# Patient Record
Sex: Female | Born: 1955 | Race: White | Hispanic: No | Smoking: Former smoker
Health system: Southern US, Community
[De-identification: ages and names within clinical notes are randomized; demographics above are authoritative.]

## PROBLEM LIST (undated history)

## (undated) DIAGNOSIS — F32A Depression, unspecified: Secondary | ICD-10-CM

## (undated) DIAGNOSIS — E079 Disorder of thyroid, unspecified: Secondary | ICD-10-CM

## (undated) DIAGNOSIS — F410 Panic disorder [episodic paroxysmal anxiety] without agoraphobia: Secondary | ICD-10-CM

## (undated) DIAGNOSIS — E039 Hypothyroidism, unspecified: Secondary | ICD-10-CM

## (undated) DIAGNOSIS — J45909 Unspecified asthma, uncomplicated: Secondary | ICD-10-CM

## (undated) DIAGNOSIS — K219 Gastro-esophageal reflux disease without esophagitis: Secondary | ICD-10-CM

## (undated) DIAGNOSIS — F329 Major depressive disorder, single episode, unspecified: Secondary | ICD-10-CM

## (undated) HISTORY — DX: Panic disorder (episodic paroxysmal anxiety): F41.0

## (undated) HISTORY — DX: Gastro-esophageal reflux disease without esophagitis: K21.9

## (undated) HISTORY — DX: Unspecified asthma, uncomplicated: J45.909

## (undated) HISTORY — PX: ABDOMINAL HYSTERECTOMY: SHX81

## (undated) HISTORY — DX: Major depressive disorder, single episode, unspecified: F32.9

## (undated) HISTORY — DX: Depression, unspecified: F32.A

## (undated) HISTORY — DX: Disorder of thyroid, unspecified: E07.9

---

## 2002-05-15 ENCOUNTER — Ambulatory Visit (HOSPITAL_COMMUNITY): Admission: RE | Admit: 2002-05-15 | Discharge: 2002-05-15 | Payer: Self-pay | Admitting: Obstetrics and Gynecology

## 2002-05-15 ENCOUNTER — Encounter: Payer: Self-pay | Admitting: Obstetrics and Gynecology

## 2002-05-20 ENCOUNTER — Encounter: Payer: Self-pay | Admitting: Obstetrics and Gynecology

## 2002-05-20 ENCOUNTER — Ambulatory Visit (HOSPITAL_COMMUNITY): Admission: RE | Admit: 2002-05-20 | Discharge: 2002-05-20 | Payer: Self-pay | Admitting: Obstetrics and Gynecology

## 2002-05-21 ENCOUNTER — Encounter: Admission: RE | Admit: 2002-05-21 | Discharge: 2002-05-21 | Payer: Self-pay | Admitting: *Deleted

## 2002-05-26 ENCOUNTER — Encounter: Payer: Self-pay | Admitting: Obstetrics and Gynecology

## 2002-05-28 ENCOUNTER — Encounter (INDEPENDENT_AMBULATORY_CARE_PROVIDER_SITE_OTHER): Payer: Self-pay | Admitting: Specialist

## 2002-05-28 ENCOUNTER — Inpatient Hospital Stay (HOSPITAL_COMMUNITY): Admission: RE | Admit: 2002-05-28 | Discharge: 2002-05-30 | Payer: Self-pay | Admitting: Obstetrics and Gynecology

## 2010-07-22 NOTE — Discharge Summary (Signed)
NAME:  Erin Hale, Erin Hale                          ACCOUNT NO.:  [id number]   MEDICAL RECORD NO.:  [id number]                   PATIENT TYPE:  INP   LOCATION:  0443                                 FACILITY:  [facility]   PHYSICIAN:  Malachi Pro. Ambrose Mantle, M.D.              DATE OF BIRTH:  09/09/1955   DATE OF ADMISSION:  05/28/2002  DATE OF DISCHARGE:  05/30/2002                                 DISCHARGE SUMMARY   HISTORY OF PRESENT ILLNESS:  This is a 55 year old white female with a large  abdominopelvic mass, admitted for removal of the mass which was most likely  ovarian in origin, and removal of the uterus, tubes, and ovaries.   HOSPITAL COURSE:  Dr. Zachery Dakins assisted so that he could perform lymph node  biopsies and omentectomy if it was ovarian cancer.  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  The mass measured 622 g, and was described as a nodular rubbery  mass with a smooth exterior.  Subsequent to the removal of the mass which  was attached as described in the operative note, an abdominal hysterectomy  and bilateral salpingo-oophorectomy were done.  Postoperatively, the patient  did well.  She tolerated a liquid diet, remained afebrile, had good output,  voided well without difficulty, ambulated well, and was ready for discharge  on the second postoperative day.   LABORATORY DATA:  Initial hemoglobin was 13.9, hematocrit 41.7, white count  79, platelet count 282,000.  Followup hematocrits were 38.4 and 34.6.  Differential was normal.  Comprehensive metabolic profile was normal except  for a potassium of 3.3.  CA-125 was 22.2.  Urinalysis was negative.  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  Cervix showed squamous metaplasia  and focal hyperkeratosis, no dysplasia, secretory  endometrium, no  hyperplasia or malignancy, leiomyomata, intramural, right and left ovaries  benign follicular cysts, no endometriosis or malignancy, benign right and  left fallopian tubes.   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   OPERATION:  Removal of abdominopelvic mass, abdominal hysterectomy,  bilateral salpingo-oophorectomy.   CONDITION ON DISCHARGE:  Improved.   DIET:  Liquid diet until passing flatus or having a bowel movement.   ACTIVITY:  No heavy lifting or strenuous activity.   DISCHARGE INSTRUCTIONS:  Call with fever above 100.4 degrees.  Call with any  heavy vaginal bleeding.    FOLLOWUP:  Return to the office one week from surgery to remove her staples.   DISCHARGE MEDICATIONS:  Percocet 5/325 mg 24 tablets one or two q.4-6h.  p.r.n. pain is given at discharge.  Malachi Pro. Ambrose Mantle, M.D.    TFH/MEDQ  D:  05/30/2002  T:  05/30/2002  Job:  [id number]   cc:   Anselm Pancoast. Zachery Dakins, M.D.  [address]  [place]  [place] [address]  Fax: [phone]   Magnus Sinning. Dimple Casey, M.D.  [address] [place]  [place] [address]  Fax: [phone]

## 2010-07-22 NOTE — Op Note (Signed)
NAME:  Erin Hale, Erin Hale                          ACCOUNT NO.:  [id number]   MEDICAL RECORD NO.:  [id number]                   PATIENT TYPE:  INP   LOCATION:  0002                                 FACILITY:  [facility]   PHYSICIAN:  Malachi Pro. Ambrose Mantle, M.D.              DATE OF BIRTH:  20-Aug-1955   DATE OF PROCEDURE:  05/28/2002  DATE OF DISCHARGE:                                 OPERATIVE REPORT   PREOPERATIVE DIAGNOSIS:  Large abdominopelvic mass.   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   OPERATION:  1. Removal of abdominopelvic mass.  2. TAH/BSO.   OPERATOR:  Malachi Pro. Ambrose Mantle, M.D.   ASSISTANT:  Anselm Pancoast. Zachery Dakins, M.D.   ANESTHESIA:  General.   DESCRIPTION OF PROCEDURE:  The patient was brought to the operating room and  placed under satisfactory general anesthesia.  She was placed in frogleg  position.  The abdomen was prepped with Betadine solution.  The vagina and  urethra were prepped, and a Foley catheter was inserted to straight drain.  The patient was then placed supine.  The abdomen was draped as a sterile  field, and a midline incision from the pubis to the umbilicus was made and  carried in layers through the skin, subcutaneous tissue, and fascia.  A  midline was developed, and the peritoneum was entered.  There was some  ascitic fluid.  This was aspirated, and also washings were taken in case the  tumor was malignant.  I explored the upper abdomen.  The gallbladder felt  smooth.  The liver was smooth.  Both kidneys felt normal.  I did not feel  any upper abdominal abnormalities.  The mass presented right under the  incision, and it was a multilobulated mass that arose from the left side of  the fundus of the uterus and was thought to represent the left ovary.  It  was very convoluted in shape and extended up to the umbilicus but also  involved the posterior pelvic cul-de-sac.  It was densely adherent to the  right pelvic wall close to the  ureter, was adherent to the right ovary, and  seemed to have an attachment through a very large pedicle to the left fundus  of the uterus.  It was initially thought to represent the infundibulopelvic  ligament.  I initially divided the attachment to the uterus between clamps,  then divided to its attachment to the appendiceal epiploica of the sigmoid  colon, next divided its attachments to the right pelvic wall and to the  right ovary and at this point, the mass became free and was elevated and  removed from the operative field.  I then sent the tissue for frozen section  and as I began to explore the pelvis more thoroughly now that I could see  because of the absence of the mass, it was apparent that the patient still  had both  tubes and ovaries.  Using packs and retractors, I elevated the  uterus into the abdominal incision, divided both round ligaments, divided  both infundibulopelvic ligaments between clamps and doubly suture ligated  them.  I skeletonized the uterine vessels, pushed the bladder inferiorly,  clamped, cut, and suture ligated the uterine vessels, parametrial and  paracervical tissues, and then clamped, cut, and suture ligated and held the  uterosacral ligaments.  I entered the right angle of the vagina and removed  the uterus, tubes, and ovaries by transecting the upper vagina, placed  bilateral vaginal angle sutures, and closed the central portion of the  vagina with interrupted figure-of-eight sutures of 0 Vicryl.  Since the  patient's cervix presented at the introitus, I tried to give the vaginal  cuff as much support as possible by suturing the uterosacral ligaments  together in the midline with three sutures of 0 Vicryl.  After I obtained  hemostasis, I then used the round ligaments to try to give some support to  the vaginal angles.  Hemostasis was adequate.  I reperitonealized over the  vaginal cuff, liberally irrigated the pelvis and found hemostasis to be   complete.  There was one bleeder close to the right ureter where the  attachment had been divided.  We had identified the right ureter earlier.  We again identified it and then placed a ligature around the bleeder.  I  palpated the left ureter.  It was normal.  All packs and retractors were  removed.  Dr. Laureen Ochs called back and said that the tumor was a benign, smooth  muscle tumor, so it probably had been a pedunculated fibroid that over time  had continued to grow and became attached to other pelvic structures.  Neither tube or ovary appeared abnormal, and the uterus also appeared  normal.  After the packs and retractors were removed, the peritoneum was  closed with a running suture of 0 Vicryl, the fascia with multiple  interrupted figure-of-eight sutures of #1 Novofil, subcu tissue with a  running 3-0 Vicryl, and the skin was closed with automatic staples.  The  patient seemed to tolerate the procedure well.  Blood loss was about 300 mL.  Sponge and needle counts were correct.  The washings were discarded.  The  patient was returned to recovery in satisfactory condition.                                               Malachi Pro. Ambrose Mantle, M.D.    TFH/MEDQ  D:  05/28/2002  T:  05/28/2002  Job:  [id number]   cc:   Anselm Pancoast. Zachery Dakins, M.D.  [address]  [place]  [place] [address]  Fax: [phone]   Magnus Sinning. Dimple Casey, M.D.  [address] [place]  [place] [address]  Fax: [phone]

## 2010-07-22 NOTE — H&P (Signed)
NAMESKYLINN, VIALPANDO                            ACCOUNT NO.:  [id number]   MEDICAL RECORD NO.:  [id number]                   PATIENT TYPE:   LOCATION:                                       FACILITY:   PHYSICIAN:  Malachi Pro. Ambrose Mantle, M.D.              DATE OF BIRTH:   DATE OF ADMISSION:  05/28/2002  DATE OF DISCHARGE:                                HISTORY & PHYSICAL   HISTORY OF PRESENT ILLNESS:  The patient is a 55 year old white divorced  female, Para 1, 0/0/1 who was admitted to [facility] for  surgical exploration of a large abdominal pelvic mass. Last menstrual period  was May 05, 2002. The patient's periods have occurred irregularly over the  last eight months, having had only 3-4 periods in the last eight months. She  was complaining of pelvic pressure and saw Dr. Dimple Casey for evaluation. She  reported her last PAP smear was ten years ago. She had had no sexual  activity in eight years but for the past two months, she had had increased  pelvic pressure on her vagina and her rectum. She had felt a protrusion from  her vagina for two years. Her pant size had decreased. She had lost  approximately 50 pounds which she attributed to nervousness secondary to a  divorce, bankruptcy, death of a dog and death of her mother. Dr. Dimple Casey  examined her and saw the protrusion in her vagina and asked me to see her.  When I examined Ms. Jeangilles, the cervix protruded to the introitus. The  uterus felt relaxed but was not easily palpable. It was thought to be  anterior but there was suggestion of a posterior pelvic mass that also  involved the lower abdomen. She was sent for an ultrasound that showed a  slightly enlarged uterus containing at least one fibroid and a large pelvic  mass that was solid in nature and filling the cul-de-sac. A CT scan was done  which revealed normal lung bases. Unremarkable liver, gallbladder, spleen,  adrenal glands, pancreas, and kidney's. There was no  hydronephrosis, no  ascites, and no adenopathy. In the pelvis, there was a mixed attenuation  mass surrounding the fundus of the uterus and extending superior to the  uterus and bladder. It's margins were well demarcated. It measured 16.5 cm  in maximum transverse diameter. There were internal areas of enhancement and  higher attenuation. The ovaries were not discretely identified separate from  the mass. There were normal size left inguinal and left external iliac lymph  nodes, a normal appendix, and the impression was a 16.5 cm pelvic mass  encasing the uterus, suspect ovarian in origin, although the ovaries were  not discretely identified. The patient was advised exploratory laparotomy  and she is admitted for that at this time.   ALLERGIES:  No known drug allergies.   PAST SURGICAL HISTORY:  None.  PAST MEDICAL HISTORY:  No significant major adult illnesses. No heart  problems. She does have irritable bowel syndrome.   SOCIAL HISTORY:  No alcohol or tobacco. The patient is divorced. She works  27 hours per week at [employer]. She completed the 10th grade at [employer].   MEDICATIONS:  She takes Lexapro 10 mg one tab each day, Levoxyl 25 mcg one  tab twice a day, Trazodone Hydrochloride 50 mg one tab at bedtimes,  Clonazepam 0.5 mg 1/2 tab in the morning.   FAMILY HISTORY:  Mother died at 49 years of age of heart problems. Father  died at 71 years of age from brain cancer. Two sisters and five brothers are  living and well. One brother died from a self inflicted wound.   PHYSICAL EXAMINATION:  GENERAL: A well developed, obese, white female in no  acute distress.  VITAL SIGNS: Weight 245 pounds. Height 5'4. Blood pressure 100/74, pulse  78.  HEENT: No cranial abnormalities. Extraocular muscles intact. Nose and  pharynx clear.  NECK: Supple without thyromegaly.  BREASTS: Soft, no masses sitting up or lying down.  HEART: Normal size and sounds. No murmurs.  LUNGS:  Clear to auscultation and percussion.  ABDOMEN: Soft. There is a suggestion of a lower abdominal mass that rises  part way to the umbilicus.  GU: Vulva and vagina clean. PAP smear returned as atypical squamous cells of  undetermined significance. Colposcopy was done on May 27, 2002 and the  squamocolumnar junction was seen. There was minimally whitened epithelium on  the endocervix but nothing suggestive of major intraepithelial neoplasia.  The cervix protrudes to the introitus. The uterus is difficult to feel but  is thought to be anterior and small and very mobile. There is a large mass  posterior to the uterus that is irregular.  RECTAL: Examination is negative.   ADMISSION IMPRESSION:  Large abdominopelvic mass, pelvic relaxation, with  prolapse of the cervix.   PLAN:  The patient is admitted for exploratory laparotomy, removal of the  pelvic mass, probable abdominal hysterectomy, and bilateral salpingo-  oophorectomy with possible lymph node dissection and omentectomy. The  patient has been counseled about the risks of surgery including but not  limited to heart attack, stroke, pulmonary embolus, wound disruption,  hemorrhage with need for re-operation, and/or transfusion, intestinal  obstruction, nerve injury, and infection. She understands that if the tumor  is malignant, she will require additional surgery and she is ready to  proceed.                                               Malachi Pro. Ambrose Mantle, M.D.    TFH/MEDQ  D:  05/27/2002  T:  05/27/2002  Job:  [id number]   cc:   Anselm Pancoast. Zachery Dakins, M.D.  [address]  [place]  [place] [address]  Fax: [phone]   Magnus Sinning. Dimple Casey, M.D.  [address] [place]  [place] [address]  Fax: [phone]

## 2012-06-12 ENCOUNTER — Telehealth: Payer: Self-pay | Admitting: Family Medicine

## 2012-06-12 NOTE — Telephone Encounter (Signed)
Needs to be seen to change anti-anxiety/antidepressant medication.  Was out of work today because of it and wants to be seen tomorrow.  Currently there are no available appts.  I will call her back in the morning to schedule.

## 2012-06-13 ENCOUNTER — Other Ambulatory Visit: Payer: Self-pay | Admitting: *Deleted

## 2012-06-13 ENCOUNTER — Ambulatory Visit (INDEPENDENT_AMBULATORY_CARE_PROVIDER_SITE_OTHER): Payer: PRIVATE HEALTH INSURANCE | Admitting: Family Medicine

## 2012-06-13 ENCOUNTER — Ambulatory Visit: Payer: Self-pay | Admitting: General Practice

## 2012-06-13 ENCOUNTER — Encounter: Payer: Self-pay | Admitting: Family Medicine

## 2012-06-13 VITALS — BP 118/74 | HR 86 | Temp 98.6°F | Ht 64.0 in | Wt 248.0 lb

## 2012-06-13 DIAGNOSIS — R300 Dysuria: Secondary | ICD-10-CM

## 2012-06-13 DIAGNOSIS — N390 Urinary tract infection, site not specified: Secondary | ICD-10-CM

## 2012-06-13 DIAGNOSIS — R309 Painful micturition, unspecified: Secondary | ICD-10-CM

## 2012-06-13 DIAGNOSIS — R350 Frequency of micturition: Secondary | ICD-10-CM

## 2012-06-13 LAB — POCT URINALYSIS DIPSTICK
Bilirubin, UA: NEGATIVE
Glucose, UA: NEGATIVE
Nitrite, UA: NEGATIVE
Protein, UA: NEGATIVE
Spec Grav, UA: 1.025
Urobilinogen, UA: NEGATIVE
pH, UA: 6

## 2012-06-13 LAB — POCT UA - MICROSCOPIC ONLY
Bacteria, U Microscopic: NEGATIVE
Casts, Ur, LPF, POC: NEGATIVE
Crystals, Ur, HPF, POC: NEGATIVE
Mucus, UA: NEGATIVE

## 2012-06-13 MED ORDER — ALPRAZOLAM 0.25 MG PO TABS: ORAL_TABLET | ORAL | Status: DC

## 2012-06-13 MED ORDER — CIPROFLOXACIN HCL 500 MG PO TABS: 500.0000 mg | ORAL_TABLET | Freq: Two times a day (BID) | ORAL | Status: DC

## 2012-06-13 NOTE — Telephone Encounter (Signed)
Worsening of Depression.  Taking Venaflexin and would like to change back to Cymbalta.  Saw better results with that.  Has also taken Celexa in the past and didn't like it.  Also c/o dysuria and frequency for a few days.  Has taken an OTC medication and symptoms have improved but she would like to have a urinalysis.    Appt scheduled in after hours clinic to check for UTI.  Will most likely have to address depression issues at a subsequent appt.

## 2012-06-13 NOTE — Telephone Encounter (Signed)
ROUTE TO YOUR NURSE TO PHONE IN TO  [facility]  . LAST OV 9/13. LAST REFILL 05/14/12

## 2012-06-13 NOTE — Progress Notes (Signed)
  Subjective:    Patient ID: Erin Hale, female    DOB: Jul 25, 1955, 57 y.o.   MRN: [id number]  HPI Frequency burning and pain off and on for a couple of months. Some suprapubic pressure also. The most recent episode single and on for 4 days. It is only relieved by Urecholine.   Review of Systems  Gastrointestinal: Positive for abdominal pain (lower, pressure achy).  Genitourinary: Positive for urgency (off and on x 6 mths).       Objective:   Physical Exam  Nursing note and vitals reviewed. Constitutional: She is oriented to person, place, and time. She appears well-developed and well-nourished.  Abdominal: Soft. She exhibits no distension and no mass. There is no tenderness. There is no rebound and no guarding.  Neurological: She is alert and oriented to person, place, and time.  Skin: Skin is warm and dry.  Psychiatric: She has a normal mood and affect. Her behavior is normal. Judgment and thought content normal.     Results for orders placed in visit on 06/13/12  POCT UA - MICROSCOPIC ONLY      Result Value Range   WBC, Ur, HPF, POC 5-10     RBC, urine, microscopic 1-5     Bacteria, U Microscopic neg     Mucus, UA neg     Epithelial cells, urine per micros occ     Crystals, Ur, HPF, POC neg     Casts, Ur, LPF, POC neg     Yeast, UA occ    POCT URINALYSIS DIPSTICK      Result Value Range   Color, UA amber     Clarity, UA cloudy     Glucose, UA neg     Bilirubin, UA neg     Ketones, UA neg     Spec Grav, UA 1.025     Blood, UA trace     pH, UA 6.0     Protein, UA neg     Urobilinogen, UA negative     Nitrite, UA neg     Leukocytes, UA Trace         Assessment & Plan:  1. Urinary frequency - POCT UA - Microscopic Only - POCT urinalysis dipstick - Urine culture  2. Painful urination - POCT UA - Microscopic Only - POCT urinalysis dipstick - Urine culture

## 2012-06-13 NOTE — Patient Instructions (Addendum)
Drink plenty of fluids. Meds as directed Recheck urinalysis in 10-14 days Pt will taper off of Venlafaxine 75mg  taking one qod x 4 doses, then take one every third day for 2 doses, then will start Cymbalta 30mg  qd for 30days. Will return to clinic for recheck of meds in 5-6 weeks

## 2012-06-13 NOTE — Telephone Encounter (Signed)
Please phone in Xanax RX with 0 refills

## 2012-06-14 NOTE — Telephone Encounter (Signed)
Rx called to [facility] vm.  Sister will tell pt rx has been done.

## 2012-06-15 LAB — URINE CULTURE: Colony Count: 3000

## 2012-06-24 ENCOUNTER — Other Ambulatory Visit (INDEPENDENT_AMBULATORY_CARE_PROVIDER_SITE_OTHER): Payer: PRIVATE HEALTH INSURANCE

## 2012-06-24 DIAGNOSIS — N390 Urinary tract infection, site not specified: Secondary | ICD-10-CM

## 2012-06-24 LAB — POCT URINALYSIS DIPSTICK
Blood, UA: NEGATIVE
Glucose, UA: NEGATIVE
Ketones, UA: NEGATIVE
Spec Grav, UA: 1.02
Urobilinogen, UA: NEGATIVE

## 2012-06-24 LAB — POCT UA - MICROSCOPIC ONLY

## 2012-06-24 NOTE — Progress Notes (Unsigned)
Patient came in for re-check on urine  

## 2012-07-12 ENCOUNTER — Other Ambulatory Visit: Payer: Self-pay

## 2012-07-12 MED ORDER — ALPRAZOLAM 0.25 MG PO TABS: ORAL_TABLET | ORAL | Status: DC

## 2012-07-12 NOTE — Telephone Encounter (Signed)
OK to phone xanax rx

## 2012-07-12 NOTE — Telephone Encounter (Signed)
Last filled 06/13/12   If approved phone in and have nurse notify patient

## 2012-07-13 NOTE — Telephone Encounter (Signed)
Called in to [facility] 

## 2012-07-15 ENCOUNTER — Telehealth: Payer: Self-pay | Admitting: Nurse Practitioner

## 2012-07-16 NOTE — Telephone Encounter (Signed)
Patient aware we have no samples. 

## 2012-07-16 NOTE — Telephone Encounter (Signed)
Ok for cymbalta samples

## 2012-07-16 NOTE — Telephone Encounter (Signed)
Please advise 

## 2012-07-23 ENCOUNTER — Other Ambulatory Visit: Payer: Self-pay | Admitting: Family Medicine

## 2012-07-25 ENCOUNTER — Other Ambulatory Visit: Payer: Self-pay | Admitting: Family Medicine

## 2012-08-05 ENCOUNTER — Ambulatory Visit: Payer: PRIVATE HEALTH INSURANCE | Admitting: Nurse Practitioner

## 2012-08-05 ENCOUNTER — Encounter: Payer: Self-pay | Admitting: Nurse Practitioner

## 2012-08-05 ENCOUNTER — Other Ambulatory Visit: Payer: PRIVATE HEALTH INSURANCE

## 2012-08-05 ENCOUNTER — Ambulatory Visit (INDEPENDENT_AMBULATORY_CARE_PROVIDER_SITE_OTHER): Payer: PRIVATE HEALTH INSURANCE | Admitting: Nurse Practitioner

## 2012-08-05 VITALS — BP 130/74 | HR 71 | Temp 99.1°F | Ht 64.0 in | Wt 256.0 lb

## 2012-08-05 DIAGNOSIS — E039 Hypothyroidism, unspecified: Secondary | ICD-10-CM

## 2012-08-05 DIAGNOSIS — M5430 Sciatica, unspecified side: Secondary | ICD-10-CM

## 2012-08-05 DIAGNOSIS — F329 Major depressive disorder, single episode, unspecified: Secondary | ICD-10-CM | POA: Insufficient documentation

## 2012-08-05 DIAGNOSIS — K219 Gastro-esophageal reflux disease without esophagitis: Secondary | ICD-10-CM

## 2012-08-05 DIAGNOSIS — M5432 Sciatica, left side: Secondary | ICD-10-CM

## 2012-08-05 LAB — COMPLETE METABOLIC PANEL WITH GFR
Albumin: 4 g/dL (ref 3.5–5.2)
Alkaline Phosphatase: 81 U/L (ref 39–117)
Chloride: 106 mEq/L (ref 96–112)
GFR, Est Non African American: 88 mL/min
Glucose, Bld: 105 mg/dL — ABNORMAL HIGH (ref 70–99)
Potassium: 4.6 mEq/L (ref 3.5–5.3)
Sodium: 139 mEq/L (ref 135–145)
Total Protein: 6.7 g/dL (ref 6.0–8.3)

## 2012-08-05 LAB — THYROID PANEL WITH TSH
Free Thyroxine Index: 3.6 (ref 1.0–3.9)
T3 Uptake: 32.6 % (ref 22.5–37.0)

## 2012-08-05 MED ORDER — DULOXETINE HCL 30 MG PO CPEP: 30.0000 mg | ORAL_CAPSULE | Freq: Every day | ORAL | Status: DC

## 2012-08-05 MED ORDER — PREDNISONE 20 MG PO TABS: ORAL_TABLET | ORAL | Status: DC

## 2012-08-05 MED ORDER — ALPRAZOLAM 0.25 MG PO TABS: ORAL_TABLET | ORAL | Status: DC

## 2012-08-05 NOTE — Progress Notes (Signed)
  Subjective:    Patient ID: Erin Hale, female    DOB: 01-Nov-1955, 57 y.o.   MRN: [id number]  Thyroid Problem Presents for follow-up visit. Symptoms include fatigue. Patient reports no anxiety, depressed mood, diarrhea, dry skin, hair loss, heat intolerance, leg swelling, nail problem, palpitations, visual change, weight gain or weight loss. The symptoms have been stable.  GERD Zantac- working well- occasional symptoms if she eats certain things Depression Patient currently on cymbalta and works well - no c/o side effects Left buttocks pain that radiates down leg Patient talking advil OTC which helps slightly- Started over a month ago- ddescribes pain as a burning type pain. GAD Xanax BID- helps some still gets anxious at times.   Review of Systems  Constitutional: Positive for fatigue. Negative for weight loss and weight gain.  Cardiovascular: Negative for palpitations.  Gastrointestinal: Negative for diarrhea.  Endocrine: Negative for heat intolerance.  Musculoskeletal: Positive for myalgias (left leg).  All other systems reviewed and are negative.       Objective:   Physical Exam  Constitutional: She is oriented to person, place, and time. She appears well-developed and well-nourished.  HENT:  Nose: Nose normal.  Mouth/Throat: Oropharynx is clear and moist.  Eyes: EOM are normal.  Neck: Trachea normal, normal range of motion and full passive range of motion without pain. Neck supple. No JVD present. Carotid bruit is not present. No thyromegaly present.  Cardiovascular: Normal rate, regular rhythm, normal heart sounds and intact distal pulses.  Exam reveals no gallop and no friction rub.   No murmur heard. Pulmonary/Chest: Effort normal and breath sounds normal.  Abdominal: Soft. Bowel sounds are normal. She exhibits no distension and no mass. There is no tenderness.  Musculoskeletal: Normal range of motion.  FROM of left hip without pain.  Lymphadenopathy:    She has no  cervical adenopathy.  Neurological: She is alert and oriented to person, place, and time. She has normal reflexes.  Skin: Skin is warm and dry.  Psychiatric: She has a normal mood and affect. Her behavior is normal. Judgment and thought content normal.     BP 130/74  Pulse 71  Temp(Src) 99.1 F (37.3 C) (Oral)  Ht 5\' 4"  (1.626 m)  Wt 256 lb (116.121 kg)  BMI 43.92 kg/m2       Assessment & Plan:  1. Hypothyroidism  - COMPLETE METABOLIC PANEL WITH GFR - NMR Lipoprofile with Lipids - Thyroid Panel With TSH  2. Depression Stress management - ALPRAZolam (XANAX) 0.25 MG tablet; ONE PO BID PRN  Dispense: 60 tablet; Refill: 0 - DULoxetine (CYMBALTA) 30 MG capsule; Take 1 capsule (30 mg total) by mouth daily.  Dispense: 30 capsule; Refill: 1  3. GERD (gastroesophageal reflux disease) Avoid spicy and fatty foods Continue zantac as rx  4. Sciatica of left side Hold motrin while on prednisone- Tylenol is fine - predniSONE (DELTASONE) 20 MG tablet; 2 PO at same time daily for 5 days  Dispense: 10 tablet; Refill: 0  Mary-Margaret Daphine Deutscher, FNP

## 2012-08-05 NOTE — Patient Instructions (Signed)

## 2012-08-06 LAB — NMR LIPOPROFILE WITH LIPIDS
Cholesterol, Total: 198 mg/dL
HDL Particle Number: 48.9 umol/L
HDL Size: 9.9 nm
HDL-C: 81 mg/dL
LDL (calc): 98 mg/dL
LDL Particle Number: 1008 nmol/L — ABNORMAL HIGH
LDL Size: 21.2 nm
LP-IR Score: 29
Large HDL-P: 13.9 umol/L
Large VLDL-P: 3 nmol/L — ABNORMAL HIGH
Small LDL Particle Number: 345 nmol/L
Triglycerides: 94 mg/dL
VLDL Size: 46.8 nm — ABNORMAL HIGH

## 2012-08-15 ENCOUNTER — Encounter: Payer: Self-pay | Admitting: *Deleted

## 2012-08-19 ENCOUNTER — Telehealth: Payer: Self-pay | Admitting: Nurse Practitioner

## 2012-08-20 MED ORDER — LEVOTHYROXINE SODIUM 75 MCG PO TABS: 75.0000 ug | ORAL_TABLET | Freq: Every day | ORAL | Status: DC

## 2012-08-20 NOTE — Telephone Encounter (Signed)
Done 08/19/12 

## 2012-09-13 ENCOUNTER — Other Ambulatory Visit: Payer: Self-pay

## 2012-09-13 DIAGNOSIS — F329 Major depressive disorder, single episode, unspecified: Secondary | ICD-10-CM

## 2012-09-13 MED ORDER — ALPRAZOLAM 0.25 MG PO TABS: ORAL_TABLET | ORAL | Status: DC

## 2012-09-13 NOTE — Telephone Encounter (Signed)
Last seen and filled 08/05/12   MMM  If approved call in and have nurse notify patient

## 2012-09-13 NOTE — Telephone Encounter (Signed)
Please call in xanax with 1 refill 

## 2012-09-14 NOTE — Telephone Encounter (Signed)
Pt aware and rx called into pharmacy. 

## 2012-10-14 ENCOUNTER — Telehealth: Payer: Self-pay | Admitting: Nurse Practitioner

## 2012-10-14 NOTE — Telephone Encounter (Signed)
Mmm to address 

## 2012-10-14 NOTE — Telephone Encounter (Signed)
NEEDS TO BE SEEN TO DISCUSS

## 2012-10-15 NOTE — Telephone Encounter (Signed)
Patient aware and appt made for Monday at 10:45

## 2012-10-21 ENCOUNTER — Encounter: Payer: Self-pay | Admitting: Nurse Practitioner

## 2012-10-21 ENCOUNTER — Ambulatory Visit (INDEPENDENT_AMBULATORY_CARE_PROVIDER_SITE_OTHER): Payer: PRIVATE HEALTH INSURANCE | Admitting: Nurse Practitioner

## 2012-10-21 VITALS — BP 137/82 | HR 81 | Temp 97.9°F | Ht 64.0 in | Wt 261.0 lb

## 2012-10-21 DIAGNOSIS — F329 Major depressive disorder, single episode, unspecified: Secondary | ICD-10-CM

## 2012-10-21 MED ORDER — ESCITALOPRAM OXALATE 20 MG PO TABS: 20.0000 mg | ORAL_TABLET | Freq: Every day | ORAL | Status: DC

## 2012-10-21 NOTE — Progress Notes (Signed)
  Subjective:    Patient ID: Erin Hale, female    DOB: 13-Feb-1956, 57 y.o.   MRN: [id number]  HPI Patient in to discuss her depression- she has been on cymbalta for several months- says that it doesn't seem to be helping and it cost to much- Patient would like to try something else. Patient has been on a lot of meds in the past- Zoloft, Paxil, Venlafaxine, celexa and lexapro- lexapro helped for awhile.    Review of Systems  Psychiatric/Behavioral: Positive for sleep disturbance and dysphoric mood. Negative for suicidal ideas, behavioral problems, confusion, self-injury, decreased concentration and agitation. The patient is not nervous/anxious.        Objective:   Physical Exam  Constitutional: She appears well-developed and well-nourished.  Cardiovascular: Normal rate and normal heart sounds.   Pulmonary/Chest: Effort normal and breath sounds normal.  Psychiatric: Her speech is normal and behavior is normal. Judgment and thought content normal. Cognition and memory are normal. She exhibits a depressed mood.    BP 137/82  Pulse 81  Temp(Src) 97.9 F (36.6 C) (Oral)  Ht 5\' 4"  (1.626 m)  Wt 261 lb (118.389 kg)  BMI 44.78 kg/m2       Assessment & Plan:  1. Depression Stress management Exercise daily Find something to look forward to dialy Follow up in 3 months Meds ordered this encounter  Medications  . escitalopram (LEXAPRO) 20 MG tablet    Sig: Take 1 tablet (20 mg total) by mouth daily.    Dispense:  30 tablet    Refill:  5    Order Specific Question:  Supervising Provider    Answer:  Ernestina Penna [[id number]]   D/C cymbalta- wean off if can  Mary-Margaret Daphine Deutscher, FNP  -

## 2012-10-21 NOTE — Patient Instructions (Addendum)

## 2012-11-15 ENCOUNTER — Other Ambulatory Visit: Payer: Self-pay | Admitting: *Deleted

## 2012-11-15 DIAGNOSIS — F329 Major depressive disorder, single episode, unspecified: Secondary | ICD-10-CM

## 2012-11-15 MED ORDER — ALPRAZOLAM 0.25 MG PO TABS: ORAL_TABLET | ORAL | Status: DC

## 2012-11-15 NOTE — Telephone Encounter (Signed)
Patient last seen in office on 10-21-12. Rx last filled on 10-14-12. Please advise. If approved please route to Pool B so nurse can phone in to [facility]

## 2012-11-15 NOTE — Telephone Encounter (Signed)
Please call in ambien rx with 1 refill 

## 2012-11-19 NOTE — Telephone Encounter (Signed)
Called in.

## 2012-12-16 ENCOUNTER — Other Ambulatory Visit: Payer: Self-pay

## 2013-02-04 ENCOUNTER — Ambulatory Visit: Payer: PRIVATE HEALTH INSURANCE | Admitting: Nurse Practitioner

## 2013-02-10 ENCOUNTER — Other Ambulatory Visit: Payer: Self-pay

## 2013-02-10 DIAGNOSIS — F329 Major depressive disorder, single episode, unspecified: Secondary | ICD-10-CM

## 2013-02-10 NOTE — Telephone Encounter (Signed)
Last seen 10/21/12  MMM If approved route to nurse to call into [facility] 

## 2013-02-10 NOTE — Telephone Encounter (Signed)
Please call in xanax rx 

## 2013-02-11 NOTE — Telephone Encounter (Signed)
Refill given to pharmacist at [facility]

## 2013-03-13 ENCOUNTER — Other Ambulatory Visit: Payer: Self-pay

## 2013-03-13 DIAGNOSIS — F329 Major depressive disorder, single episode, unspecified: Secondary | ICD-10-CM

## 2013-03-13 DIAGNOSIS — F32A Depression, unspecified: Secondary | ICD-10-CM

## 2013-03-13 MED ORDER — ALPRAZOLAM 0.25 MG PO TABS: ORAL_TABLET | ORAL | Status: DC

## 2013-03-13 NOTE — Telephone Encounter (Signed)
Called in.

## 2013-03-13 NOTE — Telephone Encounter (Signed)
Please call in xanax rx 

## 2013-03-13 NOTE — Telephone Encounter (Signed)
Last seen 10/21/12  MMM If approved route to nurse to call into [facility]

## 2013-04-14 ENCOUNTER — Other Ambulatory Visit: Payer: Self-pay | Admitting: Nurse Practitioner

## 2013-04-15 NOTE — Telephone Encounter (Signed)
Last seen 10/21/12  MMM

## 2013-04-28 ENCOUNTER — Telehealth: Payer: Self-pay | Admitting: Nurse Practitioner

## 2013-04-28 NOTE — Telephone Encounter (Signed)
appt made. Refused appt today.

## 2013-04-29 ENCOUNTER — Ambulatory Visit: Payer: PRIVATE HEALTH INSURANCE | Admitting: Nurse Practitioner

## 2013-04-29 ENCOUNTER — Encounter: Payer: Self-pay | Admitting: Family Medicine

## 2013-04-29 ENCOUNTER — Ambulatory Visit (INDEPENDENT_AMBULATORY_CARE_PROVIDER_SITE_OTHER): Payer: PRIVATE HEALTH INSURANCE | Admitting: Family Medicine

## 2013-04-29 VITALS — BP 135/88 | HR 104 | Temp 99.0°F | Ht 64.0 in | Wt 259.8 lb

## 2013-04-29 DIAGNOSIS — J111 Influenza due to unidentified influenza virus with other respiratory manifestations: Secondary | ICD-10-CM

## 2013-04-29 DIAGNOSIS — J029 Acute pharyngitis, unspecified: Secondary | ICD-10-CM

## 2013-04-29 DIAGNOSIS — R52 Pain, unspecified: Secondary | ICD-10-CM

## 2013-04-29 DIAGNOSIS — R509 Fever, unspecified: Secondary | ICD-10-CM

## 2013-04-29 LAB — POCT INFLUENZA A/B
Influenza A, POC: NEGATIVE
Influenza B, POC: POSITIVE

## 2013-04-29 LAB — POCT RAPID STREP A (OFFICE): Rapid Strep A Screen: NEGATIVE

## 2013-04-29 MED ORDER — OSELTAMIVIR PHOSPHATE 75 MG PO CAPS: 75.0000 mg | ORAL_CAPSULE | Freq: Two times a day (BID) | ORAL | Status: DC

## 2013-04-29 MED ORDER — AZITHROMYCIN 250 MG PO TABS: ORAL_TABLET | ORAL | Status: DC

## 2013-04-29 NOTE — Progress Notes (Signed)
   Subjective:    Patient ID: Erin Hale, female    DOB: 1955/10/02, 58 y.o.   MRN: [id number]  HPI This 58 y.o. female presents for evaluation of uri, sore throat, and body aches.   Review of Systems No chest pain, SOB, HA, dizziness, vision change, N/V, diarrhea, constipation, dysuria, urinary urgency or frequency, myalgias, arthralgias or rash.     Objective:   Physical Exam Vital signs noted  Well developed well nourished female.  HEENT - Head atraumatic Normocephalic                Eyes - PERRLA, Conjuctiva - clear Sclera- Clear EOMI                Ears - EAC's Wnl TM's Wnl Gross Hearing WNL                Nose - Nares patent                 Throat - oropharanx wnl Respiratory - Lungs CTA bilateral Cardiac - RRR S1 and S2 without murmur GI - Abdomen soft Nontender and bowel sounds active x 4 Extremities - No edema. Neuro - Grossly intact.       Assessment & Plan:  Sore throat - Plan: POCT rapid strep A, POCT Influenza A/B, azithromycin (ZITHROMAX) 250 MG tablet, oseltamivir (TAMIFLU) 75 MG capsule  Fever - Plan: POCT rapid strep A, POCT Influenza A/B, azithromycin (ZITHROMAX) 250 MG tablet, oseltamivir (TAMIFLU) 75 MG capsule  Body aches - Plan: POCT rapid strep A, POCT Influenza A/B, azithromycin (ZITHROMAX) 250 MG tablet, oseltamivir (TAMIFLU) 75 MG capsule  Flu - Plan: azithromycin (ZITHROMAX) 250 MG tablet, oseltamivir (TAMIFLU) 75 MG capsule  Push po fluids, rest, tylenol and motrin otc prn as directed for fever, arthralgias, and myalgias.  Follow up prn if sx's continue or persist.  Lysbeth Penner FNP

## 2013-05-07 ENCOUNTER — Encounter: Payer: Self-pay | Admitting: *Deleted

## 2013-05-07 ENCOUNTER — Ambulatory Visit (INDEPENDENT_AMBULATORY_CARE_PROVIDER_SITE_OTHER): Payer: PRIVATE HEALTH INSURANCE | Admitting: Family Medicine

## 2013-05-07 ENCOUNTER — Other Ambulatory Visit: Payer: Self-pay

## 2013-05-07 VITALS — BP 117/75 | HR 73 | Temp 97.8°F | Ht 64.0 in | Wt 257.0 lb

## 2013-05-07 DIAGNOSIS — J069 Acute upper respiratory infection, unspecified: Secondary | ICD-10-CM

## 2013-05-07 DIAGNOSIS — F32A Depression, unspecified: Secondary | ICD-10-CM

## 2013-05-07 DIAGNOSIS — F329 Major depressive disorder, single episode, unspecified: Secondary | ICD-10-CM

## 2013-05-07 MED ORDER — METHYLPREDNISOLONE (PAK) 4 MG PO TABS: ORAL_TABLET | ORAL | Status: DC

## 2013-05-07 MED ORDER — AZITHROMYCIN 250 MG PO TABS: ORAL_TABLET | ORAL | Status: DC

## 2013-05-07 NOTE — Telephone Encounter (Signed)
Last seen 05/07/13  Bill if approved route to nurse to call into [facility]

## 2013-05-07 NOTE — Patient Instructions (Signed)

## 2013-05-07 NOTE — Progress Notes (Signed)
   Subjective:    Patient ID: Erin Hale, female    DOB: 06/23/1955, 58 y.o.   MRN: [id number]  HPI This 58 y.o. female presents for evaluation of cough and uri sx's for over a week. She was tx'd for flu a few weeks ago and is now having cough and wheezing. She is feeling washed out.   Review of Systems C/o cough and uri sx's. No chest pain, SOB, HA, dizziness, vision change, N/V, diarrhea, constipation, dysuria, urinary urgency or frequency, myalgias, arthralgias or rash.     Objective:   Physical Exam  Vital signs noted  Well developed well nourished female.  HEENT - Head atraumatic Normocephalic                Eyes - PERRLA, Conjuctiva - clear Sclera- Clear EOMI                Ears - EAC's Wnl TM's Wnl Gross Hearing WNL                Nose - Nares patent                 Throat - oropharanx wnl Respiratory - Lungs CTA bilateral Cardiac - RRR S1 and S2 without murmur GI - Abdomen soft Nontender and bowel sounds active x 4 Extremities - No edema. Neuro - Grossly intact.      Assessment & Plan:  URI (upper respiratory infection) - Plan: methylPREDNIsolone (MEDROL DOSPACK) 4 MG tablet, azithromycin (ZITHROMAX) 250 MG tablet Breo inhaler once a day #1 sample  Push po fluids, rest, tylenol and motrin otc prn as directed for fever, arthralgias, and myalgias.  Follow up prn if sx's continue or persist.  Lysbeth Penner FNP

## 2013-05-08 NOTE — Telephone Encounter (Signed)
Need to follow up for appointment

## 2013-05-09 ENCOUNTER — Other Ambulatory Visit: Payer: Self-pay | Admitting: *Deleted

## 2013-05-09 DIAGNOSIS — F329 Major depressive disorder, single episode, unspecified: Secondary | ICD-10-CM

## 2013-05-09 DIAGNOSIS — F32A Depression, unspecified: Secondary | ICD-10-CM

## 2013-05-09 MED ORDER — ALPRAZOLAM 0.25 MG PO TABS: ORAL_TABLET | ORAL | Status: DC

## 2013-05-09 NOTE — Telephone Encounter (Signed)
Called in.

## 2013-05-09 NOTE — Telephone Encounter (Signed)
Last seen you 10/21/12, last filled 04/13/13. Pt uses [facility]

## 2013-05-09 NOTE — Telephone Encounter (Signed)
Please call into pharmacy

## 2013-05-13 ENCOUNTER — Other Ambulatory Visit: Payer: Self-pay | Admitting: Nurse Practitioner

## 2013-06-05 ENCOUNTER — Other Ambulatory Visit: Payer: Self-pay | Admitting: General Practice

## 2013-06-09 NOTE — Telephone Encounter (Signed)
Patient NTBS for xanax refill

## 2013-06-09 NOTE — Telephone Encounter (Signed)
Last filled 05/09/13, last seen by you on 10/11/12 Uses [facility]

## 2013-06-10 ENCOUNTER — Other Ambulatory Visit: Payer: Self-pay | Admitting: General Practice

## 2013-06-15 ENCOUNTER — Other Ambulatory Visit: Payer: Self-pay | Admitting: Nurse Practitioner

## 2013-06-16 ENCOUNTER — Ambulatory Visit (INDEPENDENT_AMBULATORY_CARE_PROVIDER_SITE_OTHER): Payer: PRIVATE HEALTH INSURANCE | Admitting: Nurse Practitioner

## 2013-06-16 ENCOUNTER — Encounter: Payer: Self-pay | Admitting: Nurse Practitioner

## 2013-06-16 VITALS — BP 120/72 | HR 75 | Temp 99.0°F | Ht 64.0 in | Wt 262.8 lb

## 2013-06-16 DIAGNOSIS — E039 Hypothyroidism, unspecified: Secondary | ICD-10-CM

## 2013-06-16 DIAGNOSIS — K219 Gastro-esophageal reflux disease without esophagitis: Secondary | ICD-10-CM

## 2013-06-16 DIAGNOSIS — F3289 Other specified depressive episodes: Secondary | ICD-10-CM

## 2013-06-16 DIAGNOSIS — F329 Major depressive disorder, single episode, unspecified: Secondary | ICD-10-CM

## 2013-06-16 DIAGNOSIS — F32A Depression, unspecified: Secondary | ICD-10-CM

## 2013-06-16 DIAGNOSIS — R350 Frequency of micturition: Secondary | ICD-10-CM

## 2013-06-16 DIAGNOSIS — Z713 Dietary counseling and surveillance: Secondary | ICD-10-CM

## 2013-06-16 LAB — POCT URINALYSIS DIPSTICK
Bilirubin, UA: NEGATIVE
Glucose, UA: NEGATIVE
KETONES UA: NEGATIVE
NITRITE UA: NEGATIVE
PH UA: 6
RBC UA: NEGATIVE
Urobilinogen, UA: NEGATIVE

## 2013-06-16 LAB — POCT UA - MICROSCOPIC ONLY
CASTS, UR, LPF, POC: NEGATIVE
CRYSTALS, UR, HPF, POC: NEGATIVE
MUCUS UA: NEGATIVE
Yeast, UA: NEGATIVE

## 2013-06-16 MED ORDER — LEVOTHYROXINE SODIUM 75 MCG PO TABS: 75.0000 ug | ORAL_TABLET | Freq: Every day | ORAL | Status: DC

## 2013-06-16 MED ORDER — ESCITALOPRAM OXALATE 20 MG PO TABS: ORAL_TABLET | ORAL | Status: DC

## 2013-06-16 MED ORDER — ALPRAZOLAM 0.25 MG PO TABS: ORAL_TABLET | ORAL | Status: DC

## 2013-06-16 MED ORDER — RANITIDINE HCL 150 MG PO CAPS: 150.0000 mg | ORAL_CAPSULE | Freq: Two times a day (BID) | ORAL | Status: DC

## 2013-06-16 NOTE — Progress Notes (Signed)
Subjective:    Patient ID: Rubye Oaks, female    DOB: 1955-09-18, 58 y.o.   MRN: [id number]  Patient here today for follow up of chronic medical problems.  Gastrophageal Reflux Associated symptoms include fatigue. Pertinent negatives include no weight loss.  Thyroid Problem Presents for follow-up visit. Symptoms include fatigue. Patient reports no anxiety, depressed mood, diarrhea, dry skin, hair loss, heat intolerance, leg swelling, nail problem, palpitations, visual change, weight gain or weight loss. The symptoms have been stable.  GERD Zantac- working well- occasional symptoms if she eats certain things Depression Patient currently on cymbalta and works well - no c/o side effects GAD Xanax BID- helps some still gets anxious at times.   Review of Systems  Constitutional: Positive for fatigue. Negative for weight loss and weight gain.  Cardiovascular: Negative for palpitations.  Gastrointestinal: Negative for diarrhea.  Endocrine: Negative for heat intolerance.  Musculoskeletal: Positive for myalgias (left leg).  All other systems reviewed and are negative.      Objective:   Physical Exam  Constitutional: She is oriented to person, place, and time. She appears well-developed and well-nourished.  HENT:  Nose: Nose normal.  Mouth/Throat: Oropharynx is clear and moist.  Eyes: EOM are normal.  Neck: Trachea normal, normal range of motion and full passive range of motion without pain. Neck supple. No JVD present. Carotid bruit is not present. No thyromegaly present.  Cardiovascular: Normal rate, regular rhythm, normal heart sounds and intact distal pulses.  Exam reveals no gallop and no friction rub.   No murmur heard. Pulmonary/Chest: Effort normal and breath sounds normal.  Abdominal: Soft. Bowel sounds are normal. She exhibits no distension and no mass. There is no tenderness.  Musculoskeletal: Normal range of motion.  FROM of left hip without pain.  Lymphadenopathy:   She has no cervical adenopathy.  Neurological: She is alert and oriented to person, place, and time. She has normal reflexes.  Skin: Skin is warm and dry.  Psychiatric: She has a normal mood and affect. Her behavior is normal. Judgment and thought content normal.     BP 120/72  Pulse 75  Temp(Src) 99 F (37.2 C) (Oral)  Ht _0  (1.626 m)  Wt 262 lb 12.8 oz (119.205 kg)  BMI 45.09 kg/m2       Assessment & Plan:   1. Urinary frequency   2. Hypothyroidism   3. GERD (gastroesophageal reflux disease)   4. Depression    Orders Placed This Encounter  Procedures  . CMP14+EGFR  . NMR, lipoprofile  . Thyroid Panel With TSH  . POCT urinalysis dipstick  . POCT UA - Microscopic Only   Meds ordered this encounter  Medications  . ALPRAZolam (XANAX) 0.25 MG tablet    Sig: ONE PO BID PRN    Dispense:  60 tablet    Refill:  1    Order Specific Question:  Supervising Provider    Answer:  Chipper Herb [[id number]]  . escitalopram (LEXAPRO) 20 MG tablet    Sig: TAKE 1 TABLET (20 MG TOTAL) BY MOUTH DAILY.    Dispense:  30 tablet    Refill:  5    Order Specific Question:  Supervising Provider    Answer:  Chipper Herb [[id number]]  . levothyroxine (SYNTHROID, LEVOTHROID) 75 MCG tablet    Sig: Take 1 tablet (75 mcg total) by mouth daily before breakfast.    Dispense:  30 tablet    Refill:  5    Order Specific Question:  Supervising Provider    Answer:  Chipper Herb [[id number]]  . ranitidine (ZANTAC) 150 MG capsule    Sig: Take 1 capsule (150 mg total) by mouth 2 (two) times daily.    Dispense:  30 capsule    Refill:  5    Order Specific Question:  Supervising Provider    Answer:  Chipper Herb [[id number]]   Pt to schedule mammogram  Labs pending Health maintenance reviewed- refuses most preventative care Diet and exercise encouraged Continue all meds Follow up  In 3 months   [place], FNP

## 2013-06-16 NOTE — Patient Instructions (Signed)

## 2013-06-17 LAB — CMP14+EGFR
ALK PHOS: 96 IU/L (ref 39–117)
ALT: 16 IU/L (ref 0–32)
AST: 19 IU/L (ref 0–40)
Albumin/Globulin Ratio: 1.8 (ref 1.1–2.5)
Albumin: 4.2 g/dL (ref 3.5–5.5)
BUN / CREAT RATIO: 17 (ref 9–23)
BUN: 14 mg/dL (ref 6–24)
CHLORIDE: 105 mmol/L (ref 97–108)
CO2: 26 mmol/L (ref 18–29)
Calcium: 9 mg/dL (ref 8.7–10.2)
Creatinine, Ser: 0.82 mg/dL (ref 0.57–1.00)
GFR calc Af Amer: 92 mL/min/{1.73_m2} (ref >59)
GFR calc non Af Amer: 80 mL/min/{1.73_m2} (ref >59)
GLOBULIN, TOTAL: 2.3 g/dL (ref 1.5–4.5)
Glucose: 102 mg/dL — ABNORMAL HIGH (ref 65–99)
Potassium: 4.2 mmol/L (ref 3.5–5.2)
SODIUM: 144 mmol/L (ref 134–144)
Total Bilirubin: 0.7 mg/dL (ref 0.0–1.2)
Total Protein: 6.5 g/dL (ref 6.0–8.5)

## 2013-06-17 LAB — NMR, LIPOPROFILE
CHOLESTEROL: 206 mg/dL — AB (ref <200)
HDL Cholesterol by NMR: 72 mg/dL (ref >=40)
HDL PARTICLE NUMBER: 41.1 umol/L (ref >=30.5)
LDL Particle Number: 1173 nmol/L — ABNORMAL HIGH (ref <1000)
LDL SIZE: 21.4 nm (ref >20.5)
LDLC SERPL CALC-MCNC: 115 mg/dL — ABNORMAL HIGH (ref <100)
LP-IR Score: 27 (ref <=45)
SMALL LDL PARTICLE NUMBER: 332 nmol/L (ref <=527)
TRIGLYCERIDES BY NMR: 94 mg/dL (ref <150)

## 2013-06-17 LAB — THYROID PANEL WITH TSH
Free Thyroxine Index: 3.2 (ref 1.2–4.9)
T3 Uptake Ratio: 32 % (ref 24–39)
T4 TOTAL: 10 ug/dL (ref 4.5–12.0)
TSH: 1.29 u[IU]/mL (ref 0.450–4.500)

## 2013-06-19 NOTE — Telephone Encounter (Signed)
No vm on cell and no answer at home #.

## 2013-06-23 NOTE — Telephone Encounter (Signed)
Message copied by Shelbie Ammons on Mon Jun 23, 2013 11:04 AM ------      Message from: Chevis Pretty      Created: Wed Jun 18, 2013  8:40 AM       Urine normal- have protein in urine recheck at next visit      Kidney and liver function stable      LDL particle numbers are normal- but LDL are elevated- low fat diet      Thyroid  Normal'Continue current meds- low fat diet and exercise and recheck in 3 months       ------

## 2013-07-03 ENCOUNTER — Other Ambulatory Visit: Payer: Self-pay | Admitting: Nurse Practitioner

## 2013-07-05 NOTE — Telephone Encounter (Signed)
ntbs for prednisone refill 

## 2013-08-07 ENCOUNTER — Other Ambulatory Visit: Payer: Self-pay | Admitting: Nurse Practitioner

## 2013-08-08 NOTE — Telephone Encounter (Signed)
Not due till next week 

## 2013-08-08 NOTE — Telephone Encounter (Signed)
Patient last seen in office on 06-16-13. Rx last filled on 07-13-13 for #60. Please advise on refill. If approved please route to Pool B so nurse can phone in to pharmacy

## 2013-08-09 ENCOUNTER — Other Ambulatory Visit: Payer: Self-pay | Admitting: Nurse Practitioner

## 2013-08-11 NOTE — Telephone Encounter (Signed)
Patient last seen in office on 06-16-13. Rx last filled on 07-19-13 for #60. Please advise on refill. If approved please route to Pool B so nurse can phone in to pharmacy

## 2013-08-11 NOTE — Telephone Encounter (Signed)
Please call in xanax with 1 refillsxanax 

## 2013-08-11 NOTE — Telephone Encounter (Signed)
Called in.

## 2013-09-08 ENCOUNTER — Other Ambulatory Visit: Payer: Self-pay | Admitting: Nurse Practitioner

## 2013-09-10 NOTE — Telephone Encounter (Signed)
Patient last seen in office on 06-16-13. Rx last filled on 08-11-13 for #60. Please advise. If approved please route to Pool B so nurse can phone in to pharmacy

## 2013-09-11 ENCOUNTER — Telehealth: Payer: Self-pay | Admitting: *Deleted

## 2013-09-11 ENCOUNTER — Other Ambulatory Visit: Payer: Self-pay

## 2013-09-11 MED ORDER — ALPRAZOLAM 0.25 MG PO TABS: 0.2500 mg | ORAL_TABLET | Freq: Every day | ORAL | Status: DC

## 2013-09-11 NOTE — Telephone Encounter (Signed)
Please call in xanax with 1 refills 

## 2013-09-11 NOTE — Telephone Encounter (Signed)
Last seen 08/16/13  MMM  If approved route to nurse to call into [facility]

## 2013-09-11 NOTE — Telephone Encounter (Signed)
Called in xanax to [facility] per patients request

## 2013-09-11 NOTE — Telephone Encounter (Signed)
Please call in xanax with 0 refills 

## 2013-09-11 NOTE — Telephone Encounter (Signed)
Called to [facility] 

## 2013-09-12 NOTE — Telephone Encounter (Signed)
Called in.

## 2013-10-08 ENCOUNTER — Other Ambulatory Visit: Payer: Self-pay | Admitting: Nurse Practitioner

## 2013-10-10 NOTE — Telephone Encounter (Signed)
Please call in xanax with 1 refills 

## 2013-10-10 NOTE — Telephone Encounter (Signed)
Last seen 06/16/13 MMM If approved route to nurse and call into [facility]

## 2013-10-11 NOTE — Telephone Encounter (Signed)
Called in.

## 2013-12-07 ENCOUNTER — Other Ambulatory Visit: Payer: Self-pay | Admitting: Nurse Practitioner

## 2013-12-08 ENCOUNTER — Other Ambulatory Visit: Payer: Self-pay | Admitting: *Deleted

## 2013-12-08 MED ORDER — ALPRAZOLAM 0.25 MG PO TABS: ORAL_TABLET | ORAL | Status: DC

## 2013-12-08 NOTE — Telephone Encounter (Signed)
Last seen 06/2013. Uses [facility]

## 2013-12-08 NOTE — Telephone Encounter (Signed)
Please call in xanax with 1 refills 

## 2013-12-09 NOTE — Telephone Encounter (Signed)
Called in.

## 2014-01-05 ENCOUNTER — Ambulatory Visit (INDEPENDENT_AMBULATORY_CARE_PROVIDER_SITE_OTHER): Payer: PRIVATE HEALTH INSURANCE

## 2014-01-05 ENCOUNTER — Encounter: Payer: Self-pay | Admitting: Nurse Practitioner

## 2014-01-05 ENCOUNTER — Ambulatory Visit (INDEPENDENT_AMBULATORY_CARE_PROVIDER_SITE_OTHER): Payer: PRIVATE HEALTH INSURANCE | Admitting: Nurse Practitioner

## 2014-01-05 VITALS — BP 130/77 | HR 69 | Temp 97.5°F | Ht 64.0 in | Wt 280.2 lb

## 2014-01-05 DIAGNOSIS — R1900 Intra-abdominal and pelvic swelling, mass and lump, unspecified site: Secondary | ICD-10-CM

## 2014-01-05 DIAGNOSIS — E039 Hypothyroidism, unspecified: Secondary | ICD-10-CM

## 2014-01-05 DIAGNOSIS — F32A Depression, unspecified: Secondary | ICD-10-CM

## 2014-01-05 DIAGNOSIS — R198 Other specified symptoms and signs involving the digestive system and abdomen: Secondary | ICD-10-CM

## 2014-01-05 DIAGNOSIS — K219 Gastro-esophageal reflux disease without esophagitis: Secondary | ICD-10-CM

## 2014-01-05 DIAGNOSIS — F329 Major depressive disorder, single episode, unspecified: Secondary | ICD-10-CM

## 2014-01-05 MED ORDER — LEVOTHYROXINE SODIUM 75 MCG PO TABS: 75.0000 ug | ORAL_TABLET | Freq: Every day | ORAL | Status: DC

## 2014-01-05 MED ORDER — ESCITALOPRAM OXALATE 20 MG PO TABS: ORAL_TABLET | ORAL | Status: DC

## 2014-01-05 MED ORDER — ALPRAZOLAM 0.25 MG PO TABS: ORAL_TABLET | ORAL | Status: DC

## 2014-01-05 NOTE — Progress Notes (Signed)
  Subjective:    Patient ID: Erin Hale, female    DOB: 1955-04-12, 58 y.o.   MRN: [id number]  Patient here today for follow up of chronic medical problems. Complaint of abdominal tightness but denies constipation or diarrhea.   Thyroid Problem Symptoms include weight gain. Patient reports no diarrhea, heat intolerance, palpitations or visual change. The symptoms have been stable. Past treatments include levothyroxine. The treatment provided moderate relief. Her past medical history is significant for obesity.  GERD Zantac- working well- occasional symptoms if she eats certain things Depression Patient currently on cymbalta and works well - no c/o side effects GAD Xanax BID- helps some still gets anxious at times.   Review of Systems  Constitutional: Positive for weight gain.  Cardiovascular: Negative for palpitations.  Gastrointestinal: Negative for diarrhea.  Endocrine: Negative for heat intolerance.  Musculoskeletal: Positive for myalgias (left leg).  All other systems reviewed and are negative.      Objective:   Physical Exam  Constitutional: She is oriented to person, place, and time. She appears well-developed and well-nourished.  HENT:  Head: Normocephalic.  Nose: Nose normal.  Mouth/Throat: Oropharynx is clear and moist.  Eyes: EOM are normal. Pupils are equal, round, and reactive to light.  Neck: Trachea normal, normal range of motion and full passive range of motion without pain. Neck supple. No JVD present. Carotid bruit is not present. No thyromegaly present.  Cardiovascular: Normal rate, regular rhythm, normal heart sounds and intact distal pulses.  Exam reveals no gallop and no friction rub.   No murmur heard. Pulmonary/Chest: Effort normal and breath sounds normal.  Abdominal: Soft. Normal appearance and bowel sounds are normal. She exhibits no shifting dullness, no distension and no mass. There is no tenderness.  Musculoskeletal: Normal range of motion.   Lymphadenopathy:    She has no cervical adenopathy.  Neurological: She is alert and oriented to person, place, and time. She has normal reflexes.  Skin: Skin is warm and dry.  Psychiatric: She has a normal mood and affect. Her behavior is normal. Judgment and thought content normal.    BP 130/77 mmHg  Pulse 69  Temp(Src) 97.5 F (36.4 C) (Oral)  Ht 5' 4" (1.626 m)  Wt 280 lb 3.2 oz (127.098 kg)  BMI 48.07 kg/m2  LMP  (LMP Unknown)  KUB- [place]te amount of stool burden-Preliminary reading by Ronnald Collum, FNP  [facility]       Assessment & Plan:   1. Depression Stress management - ALPRAZolam (XANAX) 0.25 MG tablet; TAKE ONE TABLET BY MOUTH TWICE DAILY AS NEEDED  Dispense: 60 tablet; Refill: 0 - escitalopram (LEXAPRO) 20 MG tablet; TAKE ONE TABLET BY MOUTH ONE TIME DAILY  Dispense: 30 tablet; Refill: 5  2. Hypothyroidism, unspecified hypothyroidism type - levothyroxine (SYNTHROID, LEVOTHROID) 75 MCG tablet; Take 1 tablet (75 mcg total) by mouth daily before breakfast.  Dispense: 30 tablet; Refill: 5 - CMP14+EGFR - NMR, lipoprofile - Thyroid Panel With TSH  3. Gastroesophageal reflux disease, esophagitis presence not specified Avoid spicy and fatty foods   Refuses all immunizations Patient to make appointment fo rmammogram Labs pending Health maintenance reviewed Diet and exercise encouraged Continue all meds Follow up  In 6 months   [place], FNP

## 2014-01-05 NOTE — Patient Instructions (Signed)
Hypothyroidism The thyroid is a large gland located in the lower front of your neck. The thyroid gland helps control metabolism. Metabolism is how your body handles food. It controls metabolism with the hormone thyroxine. When this gland is underactive (hypothyroid), it produces too little hormone.  CAUSES These include:   Absence or destruction of thyroid tissue.  Goiter due to iodine deficiency.  Goiter due to medications.  Congenital defects (since birth).  Problems with the pituitary. This causes a lack of TSH (thyroid stimulating hormone). This hormone tells the thyroid to turn out more hormone. SYMPTOMS  Lethargy (feeling as though you have no energy)  Cold intolerance  Weight gain (in spite of normal food intake)  Dry skin  Coarse hair  Menstrual irregularity (if severe, may lead to infertility)  Slowing of thought processes Cardiac problems are also caused by insufficient amounts of thyroid hormone. Hypothyroidism in the newborn is cretinism, and is an extreme form. It is important that this form be treated adequately and immediately or it will lead rapidly to retarded physical and mental development. DIAGNOSIS  To prove hypothyroidism, your caregiver may do blood tests and ultrasound tests. Sometimes the signs are hidden. It may be necessary for your caregiver to watch this illness with blood tests either before or after diagnosis and treatment. TREATMENT  Low levels of thyroid hormone are increased by using synthetic thyroid hormone. This is a safe, effective treatment. It usually takes about four weeks to gain the full effects of the medication. After you have the full effect of the medication, it will generally take another four weeks for problems to leave. Your caregiver may start you on low doses. If you have had heart problems the dose may be gradually increased. It is generally not an emergency to get rapidly to normal. HOME CARE INSTRUCTIONS   Take your  medications as your caregiver suggests. Let your caregiver know of any medications you are taking or start taking. Your caregiver will help you with dosage schedules.  As your condition improves, your dosage needs may increase. It will be necessary to have continuing blood tests as suggested by your caregiver.  Report all suspected medication side effects to your caregiver. SEEK MEDICAL CARE IF: Seek medical care if you develop:  Sweating.  Tremulousness (tremors).  Anxiety.  Rapid weight loss.  Heat intolerance.  Emotional swings.  Diarrhea.  Weakness. SEEK IMMEDIATE MEDICAL CARE IF:  You develop chest pain, an irregular heart beat (palpitations), or a rapid heart beat. MAKE SURE YOU:   Understand these instructions.  Will watch your condition.  Will get help right away if you are not doing well or get worse. Document Released: 02/20/2005 Document Revised: 05/15/2011 Document Reviewed: 10/11/2007 ExitCare Patient Information 2015 [employer]. This information is not intended to replace advice given to you by your health care provider. Make sure you discuss any questions you have with your health care provider.  

## 2014-01-06 ENCOUNTER — Other Ambulatory Visit: Payer: Self-pay | Admitting: Nurse Practitioner

## 2014-01-06 LAB — CMP14+EGFR
ALT: 14 IU/L (ref 0–32)
AST: 18 IU/L (ref 0–40)
Albumin/Globulin Ratio: 1.8 (ref 1.1–2.5)
Albumin: 4 g/dL (ref 3.5–5.5)
Alkaline Phosphatase: 114 IU/L (ref 39–117)
BUN/Creatinine Ratio: 15 (ref 9–23)
BUN: 14 mg/dL (ref 6–24)
CALCIUM: 8.9 mg/dL (ref 8.7–10.2)
CO2: 24 mmol/L (ref 18–29)
Chloride: 103 mmol/L (ref 97–108)
Creatinine, Ser: 0.95 mg/dL (ref 0.57–1.00)
GFR calc Af Amer: 77 mL/min/{1.73_m2} (ref >59)
GFR calc non Af Amer: 67 mL/min/{1.73_m2} (ref >59)
Globulin, Total: 2.2 g/dL (ref 1.5–4.5)
Glucose: 102 mg/dL — ABNORMAL HIGH (ref 65–99)
Potassium: 4.1 mmol/L (ref 3.5–5.2)
SODIUM: 142 mmol/L (ref 134–144)
Total Bilirubin: 0.2 mg/dL (ref 0.0–1.2)
Total Protein: 6.2 g/dL (ref 6.0–8.5)

## 2014-01-06 LAB — THYROID PANEL WITH TSH
Free Thyroxine Index: 2.7 (ref 1.2–4.9)
T3 Uptake Ratio: 30 % (ref 24–39)
T4 TOTAL: 9 ug/dL (ref 4.5–12.0)
TSH: 2.05 u[IU]/mL (ref 0.450–4.500)

## 2014-01-06 LAB — NMR, LIPOPROFILE
Cholesterol: 192 mg/dL (ref 100–199)
HDL CHOLESTEROL BY NMR: 67 mg/dL (ref >39)
HDL Particle Number: 39.7 umol/L (ref >=30.5)
LDL Particle Number: 1131 nmol/L — ABNORMAL HIGH (ref <1000)
LDL Size: 21.5 nm (ref >20.5)
LDL-C: 109 mg/dL — ABNORMAL HIGH (ref 0–99)
SMALL LDL PARTICLE NUMBER: 258 nmol/L (ref <=527)
Triglycerides by NMR: 82 mg/dL (ref 0–149)

## 2014-02-04 ENCOUNTER — Other Ambulatory Visit: Payer: Self-pay | Admitting: Nurse Practitioner

## 2014-02-05 NOTE — Telephone Encounter (Signed)
Patient last seen in office on 01-05-14. Rx last filled on 01-07-14 for #60. Please advise.

## 2014-02-06 ENCOUNTER — Other Ambulatory Visit: Payer: Self-pay | Admitting: *Deleted

## 2014-02-06 DIAGNOSIS — F32A Depression, unspecified: Secondary | ICD-10-CM

## 2014-02-06 DIAGNOSIS — F329 Major depressive disorder, single episode, unspecified: Secondary | ICD-10-CM

## 2014-02-06 MED ORDER — ALPRAZOLAM 0.25 MG PO TABS: ORAL_TABLET | ORAL | Status: DC

## 2014-02-06 NOTE — Telephone Encounter (Signed)
Called xanax into pharmacy

## 2014-03-04 ENCOUNTER — Other Ambulatory Visit: Payer: Self-pay | Admitting: Family Medicine

## 2014-03-05 NOTE — Telephone Encounter (Signed)
Last seen 01/05/14  MMM If approved route to nurse to call into [facility]

## 2014-03-06 NOTE — Telephone Encounter (Signed)
Please call in xanax with 1 refills 

## 2014-03-07 ENCOUNTER — Other Ambulatory Visit: Payer: Self-pay | Admitting: Family Medicine

## 2014-03-07 NOTE — Telephone Encounter (Signed)
Last filled 02/06/14, last seen 01/05/14. Call into [facility]

## 2014-03-08 NOTE — Telephone Encounter (Signed)
Please call in xanax with 1 refills 

## 2014-03-09 ENCOUNTER — Other Ambulatory Visit: Payer: Self-pay | Admitting: Family Medicine

## 2014-03-10 NOTE — Telephone Encounter (Signed)
rx called into pharmacy

## 2014-04-08 ENCOUNTER — Other Ambulatory Visit: Payer: Self-pay | Admitting: Nurse Practitioner

## 2014-04-10 ENCOUNTER — Other Ambulatory Visit: Payer: Self-pay | Admitting: Nurse Practitioner

## 2014-04-10 NOTE — Telephone Encounter (Signed)
Please call in xanax with 1 refills 

## 2014-04-10 NOTE — Telephone Encounter (Signed)
Last seen 01/05/14, last filled 03/10/14. Pt uses [facility]

## 2014-04-10 NOTE — Telephone Encounter (Signed)
Called into pharmacy

## 2014-06-03 ENCOUNTER — Other Ambulatory Visit: Payer: Self-pay | Admitting: Nurse Practitioner

## 2014-06-04 NOTE — Telephone Encounter (Signed)
Please call in xanax with 1 refills 

## 2014-06-04 NOTE — Telephone Encounter (Signed)
Patient last seen in office on 01-05-14. Rx last filled on 04-10-14 for #60. Please advise

## 2014-06-05 ENCOUNTER — Other Ambulatory Visit: Payer: Self-pay | Admitting: Nurse Practitioner

## 2014-06-05 NOTE — Telephone Encounter (Signed)
rx called into pharmacy

## 2014-07-05 ENCOUNTER — Other Ambulatory Visit: Payer: Self-pay | Admitting: Nurse Practitioner

## 2014-07-30 ENCOUNTER — Other Ambulatory Visit: Payer: Self-pay | Admitting: Nurse Practitioner

## 2014-07-30 NOTE — Telephone Encounter (Signed)
rx called into pharmacy

## 2014-07-30 NOTE — Telephone Encounter (Signed)
Please call in xanax with 1 refills 

## 2014-07-30 NOTE — Telephone Encounter (Signed)
Last seen 01/06/15, last filled 07/03/14. Pt uses [facility]

## 2014-08-05 ENCOUNTER — Other Ambulatory Visit: Payer: Self-pay | Admitting: Family Medicine

## 2014-09-22 ENCOUNTER — Other Ambulatory Visit: Payer: Self-pay | Admitting: Nurse Practitioner

## 2014-09-22 NOTE — Telephone Encounter (Signed)
Last seen 01/05/14  MMM If approved route to nurse to call into [facility]

## 2014-09-22 NOTE — Telephone Encounter (Signed)
Deny xanax Patient NTBS for follow up and lab work

## 2014-09-29 ENCOUNTER — Encounter: Payer: Self-pay | Admitting: Nurse Practitioner

## 2014-09-29 ENCOUNTER — Ambulatory Visit (INDEPENDENT_AMBULATORY_CARE_PROVIDER_SITE_OTHER): Payer: PRIVATE HEALTH INSURANCE | Admitting: Nurse Practitioner

## 2014-09-29 VITALS — BP 141/87 | HR 93 | Temp 98.1°F | Ht 64.0 in | Wt 266.8 lb

## 2014-09-29 DIAGNOSIS — E039 Hypothyroidism, unspecified: Secondary | ICD-10-CM | POA: Diagnosis not present

## 2014-09-29 DIAGNOSIS — R300 Dysuria: Secondary | ICD-10-CM | POA: Diagnosis not present

## 2014-09-29 DIAGNOSIS — K219 Gastro-esophageal reflux disease without esophagitis: Secondary | ICD-10-CM | POA: Diagnosis not present

## 2014-09-29 DIAGNOSIS — F32A Depression, unspecified: Secondary | ICD-10-CM

## 2014-09-29 DIAGNOSIS — F329 Major depressive disorder, single episode, unspecified: Secondary | ICD-10-CM

## 2014-09-29 LAB — POCT UA - MICROSCOPIC ONLY
CRYSTALS, UR, HPF, POC: NEGATIVE
Casts, Ur, LPF, POC: NEGATIVE
YEAST UA: NEGATIVE

## 2014-09-29 LAB — POCT URINALYSIS DIPSTICK
Bilirubin, UA: NEGATIVE
GLUCOSE UA: NEGATIVE
Ketones, UA: NEGATIVE
LEUKOCYTES UA: NEGATIVE
Nitrite, UA: POSITIVE
SPEC GRAV UA: 1.02
UROBILINOGEN UA: NEGATIVE
pH, UA: 6

## 2014-09-29 MED ORDER — LEVOTHYROXINE SODIUM 75 MCG PO TABS: ORAL_TABLET | ORAL | Status: DC

## 2014-09-29 MED ORDER — ALPRAZOLAM 0.25 MG PO TABS: 0.2500 mg | ORAL_TABLET | Freq: Two times a day (BID) | ORAL | Status: DC | PRN

## 2014-09-29 MED ORDER — ESCITALOPRAM OXALATE 20 MG PO TABS: 20.0000 mg | ORAL_TABLET | Freq: Every day | ORAL | Status: DC

## 2014-09-29 NOTE — Patient Instructions (Signed)
Fat and Cholesterol Control Diet Fat and cholesterol levels in your blood and organs are influenced by your diet. High levels of fat and cholesterol may lead to diseases of the heart, small and large blood vessels, gallbladder, liver, and pancreas. CONTROLLING FAT AND CHOLESTEROL WITH DIET Although exercise and lifestyle factors are important, your diet is key. That is because certain foods are known to raise cholesterol and others to lower it. The goal is to balance foods for their effect on cholesterol and more importantly, to replace saturated and trans fat with other types of fat, such as monounsaturated fat, polyunsaturated fat, and omega-3 fatty acids. On average, a person should consume no more than 15 to 17 g of saturated fat daily. Saturated and trans fats are considered "bad" fats, and they will raise LDL cholesterol. Saturated fats are primarily found in animal products such as meats, butter, and cream. However, that does not mean you need to give up all your favorite foods. Today, there are good tasting, low-fat, low-cholesterol substitutes for most of the things you like to eat. Choose low-fat or nonfat alternatives. Choose round or loin cuts of red meat. These types of cuts are lowest in fat and cholesterol. Chicken (without the skin), fish, veal, and ground turkey breast are great choices. Eliminate fatty meats, such as hot dogs and salami. Even shellfish have little or no saturated fat. Have a 3 oz (85 g) portion when you eat lean meat, poultry, or fish. Trans fats are also called "partially hydrogenated oils." They are oils that have been scientifically manipulated so that they are solid at room temperature resulting in a longer shelf life and improved taste and texture of foods in which they are added. Trans fats are found in stick margarine, some tub margarines, cookies, crackers, and baked goods.  When baking and cooking, oils are a great substitute for butter. The monounsaturated oils are  especially beneficial since it is believed they lower LDL and raise HDL. The oils you should avoid entirely are saturated tropical oils, such as coconut and palm.  Remember to eat a lot from food groups that are naturally free of saturated and trans fat, including fish, fruit, vegetables, beans, grains (barley, rice, couscous, bulgur wheat), and pasta (without cream sauces).  IDENTIFYING FOODS THAT LOWER FAT AND CHOLESTEROL  Soluble fiber may lower your cholesterol. This type of fiber is found in fruits such as apples, vegetables such as broccoli, potatoes, and carrots, legumes such as beans, peas, and lentils, and grains such as barley. Foods fortified with plant sterols (phytosterol) may also lower cholesterol. You should eat at least 2 g per day of these foods for a cholesterol lowering effect.  Read package labels to identify low-saturated fats, trans fat free, and low-fat foods at the supermarket. Select cheeses that have only 2 to 3 g saturated fat per ounce. Use a heart-healthy tub margarine that is free of trans fats or partially hydrogenated oil. When buying baked goods (cookies, crackers), avoid partially hydrogenated oils. Breads and muffins should be made from whole grains (whole-wheat or whole oat flour, instead of "flour" or "enriched flour"). Buy non-creamy canned soups with reduced salt and no added fats.  FOOD PREPARATION TECHNIQUES  Never deep-fry. If you must fry, either stir-fry, which uses very little fat, or use non-stick cooking sprays. When possible, broil, bake, or roast meats, and steam vegetables. Instead of putting butter or margarine on vegetables, use lemon and herbs, applesauce, and cinnamon (for squash and sweet potatoes). Use nonfat   yogurt, salsa, and low-fat dressings for salads.  LOW-SATURATED FAT / LOW-FAT FOOD SUBSTITUTES Meats / Saturated Fat (g)  Avoid: Steak, marbled (3 oz/85 g) / 11 g  Choose: Steak, lean (3 oz/85 g) / 4 g  Avoid: Hamburger (3 oz/85 g) / 7  g  Choose: Hamburger, lean (3 oz/85 g) / 5 g  Avoid: Ham (3 oz/85 g) / 6 g  Choose: Ham, lean cut (3 oz/85 g) / 2.4 g  Avoid: Chicken, with skin, dark meat (3 oz/85 g) / 4 g  Choose: Chicken, skin removed, dark meat (3 oz/85 g) / 2 g  Avoid: Chicken, with skin, light meat (3 oz/85 g) / 2.5 g  Choose: Chicken, skin removed, light meat (3 oz/85 g) / 1 g Dairy / Saturated Fat (g)  Avoid: Whole milk (1 cup) / 5 g  Choose: Low-fat milk, 2% (1 cup) / 3 g  Choose: Low-fat milk, 1% (1 cup) / 1.5 g  Choose: Skim milk (1 cup) / 0.3 g  Avoid: Hard cheese (1 oz/28 g) / 6 g  Choose: Skim milk cheese (1 oz/28 g) / 2 to 3 g  Avoid: Cottage cheese, 4% fat (1 cup) / 6.5 g  Choose: Low-fat cottage cheese, 1% fat (1 cup) / 1.5 g  Avoid: Ice cream (1 cup) / 9 g  Choose: Sherbet (1 cup) / 2.5 g  Choose: Nonfat frozen yogurt (1 cup) / 0.3 g  Choose: Frozen fruit bar / trace  Avoid: Whipped cream (1 tbs) / 3.5 g  Choose: Nondairy whipped topping (1 tbs) / 1 g Condiments / Saturated Fat (g)  Avoid: Mayonnaise (1 tbs) / 2 g  Choose: Low-fat mayonnaise (1 tbs) / 1 g  Avoid: Butter (1 tbs) / 7 g  Choose: Extra light margarine (1 tbs) / 1 g  Avoid: Coconut oil (1 tbs) / 11.8 g  Choose: Olive oil (1 tbs) / 1.8 g  Choose: Corn oil (1 tbs) / 1.7 g  Choose: Safflower oil (1 tbs) / 1.2 g  Choose: Sunflower oil (1 tbs) / 1.4 g  Choose: Soybean oil (1 tbs) / 2.4 g  Choose: Canola oil (1 tbs) / 1 g Document Released: 02/20/2005 Document Revised: 06/17/2012 Document Reviewed: 05/21/2013 Exit[employer] Patient Information 2015 [employer]. This information is not intended to replace advice given to you by your health care provider. Make sure you discuss any questions you have with your health care provider.  

## 2014-09-29 NOTE — Addendum Note (Signed)
Addended by: Earlene Plater on: 09/29/2014 03:22 PM   Modules accepted: Orders

## 2014-09-29 NOTE — Progress Notes (Signed)
  Subjective:    Patient ID: Erin Hale, female    DOB: May 01, 1955, 59 y.o.   MRN: [id number]  Patient here today for follow up of chronic medical problems. No complaints today.  Thyroid Problem Symptoms include weight gain. Patient reports no diarrhea, heat intolerance, palpitations or visual change. The symptoms have been stable. Past treatments include levothyroxine. The treatment provided moderate relief. Her past medical history is significant for obesity.  GERD Zantac- working well- occasional symptoms if she eats certain things Depression Patient currently on cymbalta and works well - no c/o side effects GAD Xanax BID- helps some still gets anxious at times.   Review of Systems  Constitutional: Positive for weight gain.  Eyes: Negative.   Respiratory: Negative.   Cardiovascular: Negative for palpitations.  Gastrointestinal: Negative for diarrhea.  Endocrine: Negative for heat intolerance.  Genitourinary: Negative.   Musculoskeletal: Positive for myalgias (left leg).  Neurological: Negative.   Psychiatric/Behavioral: Negative.   All other systems reviewed and are negative.      Objective:   Physical Exam  Constitutional: She is oriented to person, place, and time. She appears well-developed and well-nourished.  HENT:  Head: Normocephalic.  Nose: Nose normal.  Mouth/Throat: Oropharynx is clear and moist.  Eyes: EOM are normal. Pupils are equal, round, and reactive to light.  Neck: Trachea normal, normal range of motion and full passive range of motion without pain. Neck supple. No JVD present. Carotid bruit is not present. No thyromegaly present.  Cardiovascular: Normal rate, regular rhythm, normal heart sounds and intact distal pulses.  Exam reveals no gallop and no friction rub.   No murmur heard. Pulmonary/Chest: Effort normal and breath sounds normal.  Abdominal: Soft. Normal appearance and bowel sounds are normal. She exhibits no shifting dullness, no distension  and no mass. There is no tenderness.  Musculoskeletal: Normal range of motion.  Lymphadenopathy:    She has no cervical adenopathy.  Neurological: She is alert and oriented to person, place, and time. She has normal reflexes.  Skin: Skin is warm and dry.  Psychiatric: She has a normal mood and affect. Her behavior is normal. Judgment and thought content normal.    BP 141/87 mmHg  Pulse 93  Temp(Src) 98.1 F (36.7 C) (Oral)  Ht $R'5\' 4"'wo$  (1.626 m)  Wt 266 lb 12.8 oz (121.02 kg)  BMI 45.77 kg/m2  LMP  (LMP Unknown)         Assessment & Plan:   1. Gastroesophageal reflux disease, esophagitis presence not specified Avoid spicy foods Do not eat 2 hours prior to bedtime   2. Hypothyroidism, unspecified hypothyroidism type - levothyroxine (SYNTHROID, LEVOTHROID) 75 MCG tablet; TAKE 1 TABLET (75 MCG TOTAL)  BY MOUTH DAILY BEFORE BREAKFAST.  Dispense: 30 tablet; Refill: 4 - CMP14+EGFR - Lipid panel - Thyroid Panel With TSH  3. Depression Stress management - ALPRAZolam (XANAX) 0.25 MG tablet; Take 1 tablet (0.25 mg total) by mouth 2 (two) times daily as needed.  Dispense: 60 tablet; Refill: 0 - escitalopram (LEXAPRO) 20 MG tablet; Take 1 tablet (20 mg total) by mouth daily.  Dispense: 30 tablet; Refill: 2  4. Severe obesity (BMI >= 40) Discussed diet and exercise for person with BMI >25 Will recheck weight in 3-6 months     Labs pending Health maintenance reviewed Diet and exercise encouraged Continue all meds Follow up  In 3 month   [place], FNP

## 2014-09-29 NOTE — Addendum Note (Signed)
Addended by: Chevis Pretty on: 09/29/2014 02:49 PM   Modules accepted: Orders

## 2014-09-30 LAB — CMP14+EGFR
ALT: 14 IU/L (ref 0–32)
AST: 17 IU/L (ref 0–40)
Albumin/Globulin Ratio: 1.9 (ref 1.1–2.5)
Albumin: 4.4 g/dL (ref 3.5–5.5)
Alkaline Phosphatase: 96 IU/L (ref 39–117)
BILIRUBIN TOTAL: 0.5 mg/dL (ref 0.0–1.2)
BUN / CREAT RATIO: 17 (ref 9–23)
BUN: 15 mg/dL (ref 6–24)
CHLORIDE: 100 mmol/L (ref 97–108)
CO2: 22 mmol/L (ref 18–29)
Calcium: 9.4 mg/dL (ref 8.7–10.2)
Creatinine, Ser: 0.88 mg/dL (ref 0.57–1.00)
GFR calc Af Amer: 84 mL/min/{1.73_m2} (ref >59)
GFR calc non Af Amer: 73 mL/min/{1.73_m2} (ref >59)
GLUCOSE: 118 mg/dL — AB (ref 65–99)
Globulin, Total: 2.3 g/dL (ref 1.5–4.5)
POTASSIUM: 3.9 mmol/L (ref 3.5–5.2)
Sodium: 141 mmol/L (ref 134–144)
TOTAL PROTEIN: 6.7 g/dL (ref 6.0–8.5)

## 2014-09-30 LAB — URINE CULTURE: Organism ID, Bacteria: NO GROWTH

## 2014-09-30 LAB — LIPID PANEL
CHOLESTEROL TOTAL: 221 mg/dL — AB (ref 100–199)
Chol/HDL Ratio: 3.1 ratio units (ref 0.0–4.4)
HDL: 71 mg/dL (ref >39)
LDL CALC: 130 mg/dL — AB (ref 0–99)
Triglycerides: 98 mg/dL (ref 0–149)
VLDL Cholesterol Cal: 20 mg/dL (ref 5–40)

## 2014-09-30 LAB — THYROID PANEL WITH TSH
Free Thyroxine Index: 2.9 (ref 1.2–4.9)
T3 Uptake Ratio: 28 % (ref 24–39)
T4 TOTAL: 10.2 ug/dL (ref 4.5–12.0)
TSH: 2.15 u[IU]/mL (ref 0.450–4.500)

## 2014-10-01 ENCOUNTER — Encounter: Payer: Self-pay | Admitting: *Deleted

## 2014-10-01 NOTE — Progress Notes (Signed)
Patient aware.

## 2014-10-27 ENCOUNTER — Other Ambulatory Visit: Payer: Self-pay | Admitting: Nurse Practitioner

## 2014-10-28 NOTE — Telephone Encounter (Signed)
Please call in xanax with 1 refills 

## 2014-10-28 NOTE — Telephone Encounter (Signed)
Last seen 09/29/14 MMM If approved route to nurse to call into [facility]

## 2014-12-23 ENCOUNTER — Other Ambulatory Visit: Payer: Self-pay | Admitting: Nurse Practitioner

## 2014-12-23 NOTE — Telephone Encounter (Signed)
Last seen 09/29/14 MMM If approved route to nurse to call into [facility] 

## 2014-12-24 NOTE — Telephone Encounter (Signed)
rx called into pharmacy

## 2014-12-24 NOTE — Telephone Encounter (Signed)
Please call in xanax with 1 refills 

## 2015-01-10 ENCOUNTER — Other Ambulatory Visit: Payer: Self-pay | Admitting: Nurse Practitioner

## 2015-01-27 IMAGING — CR DG ABDOMEN 1V
2 series · 2 of 2 positions shown · non-contrast
Comparison: None currently available

CLINICAL DATA: Abdominal enlargement.

EXAM:
ABDOMEN - 1 VIEW

[view not recorded (1 of 2)]
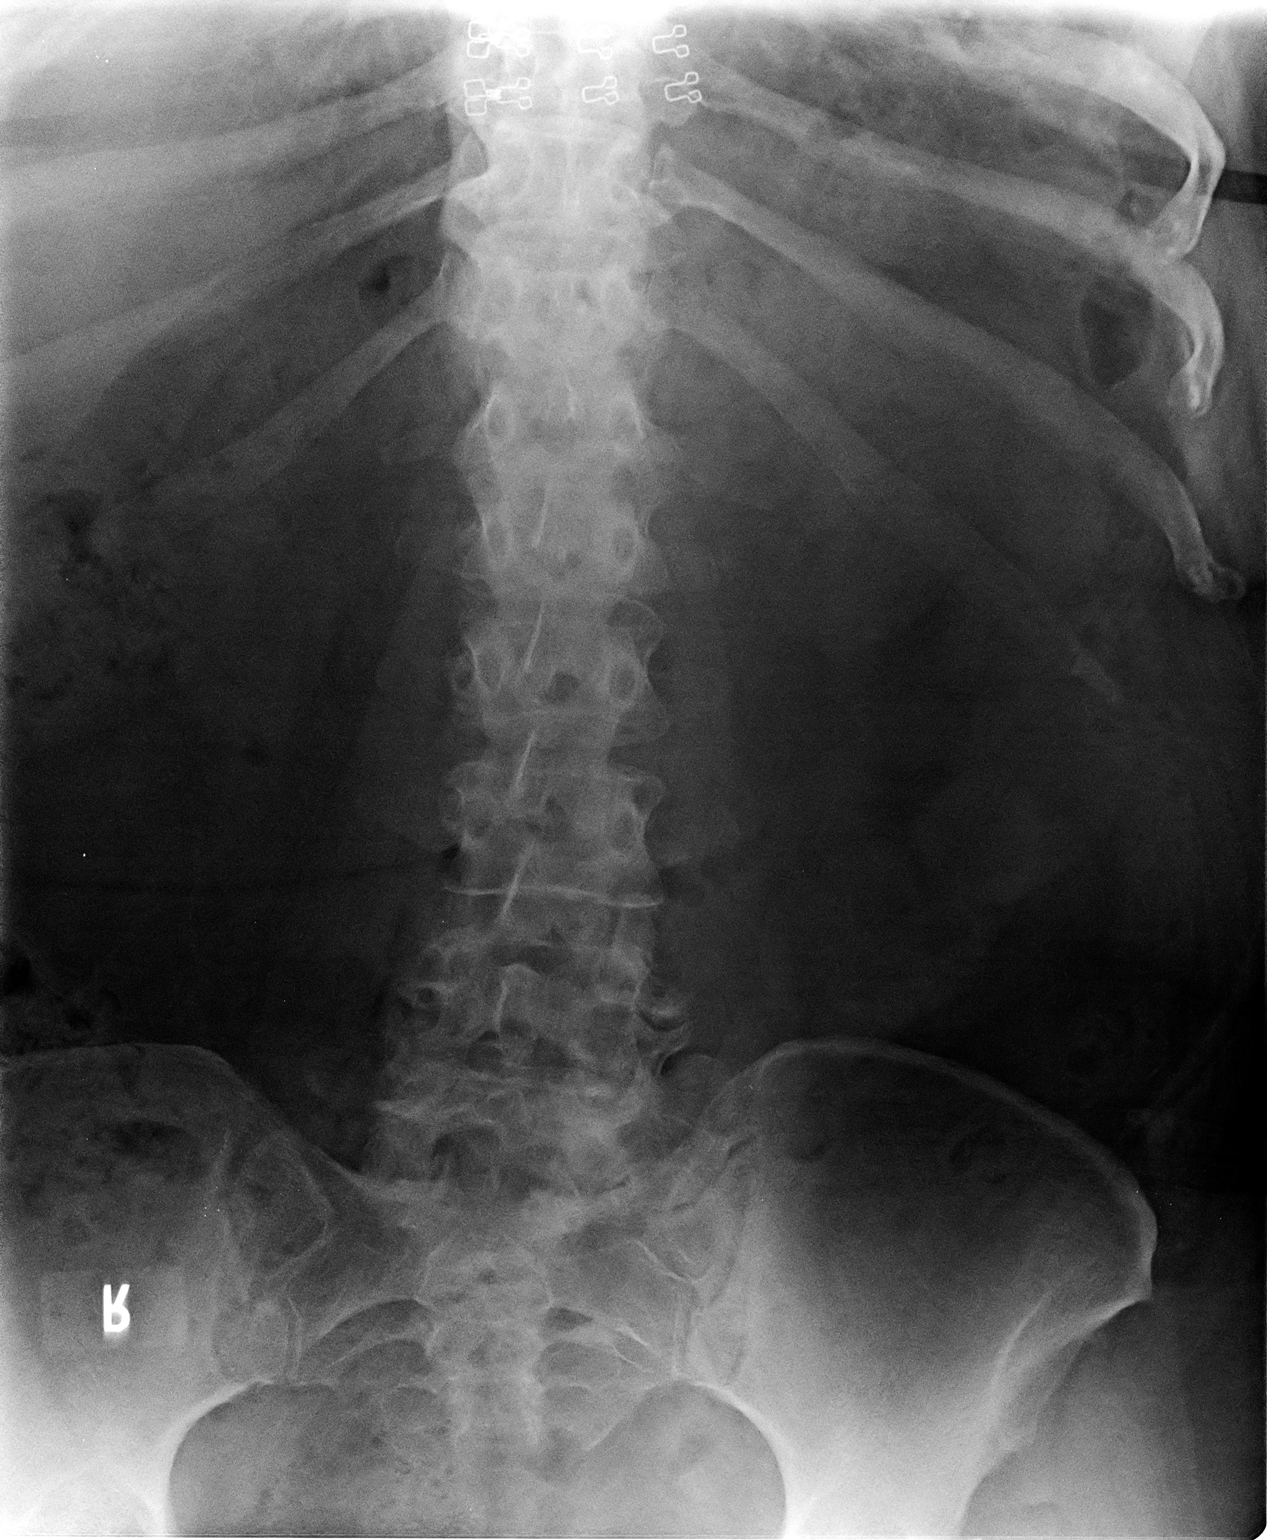

[view not recorded (2 of 2)]
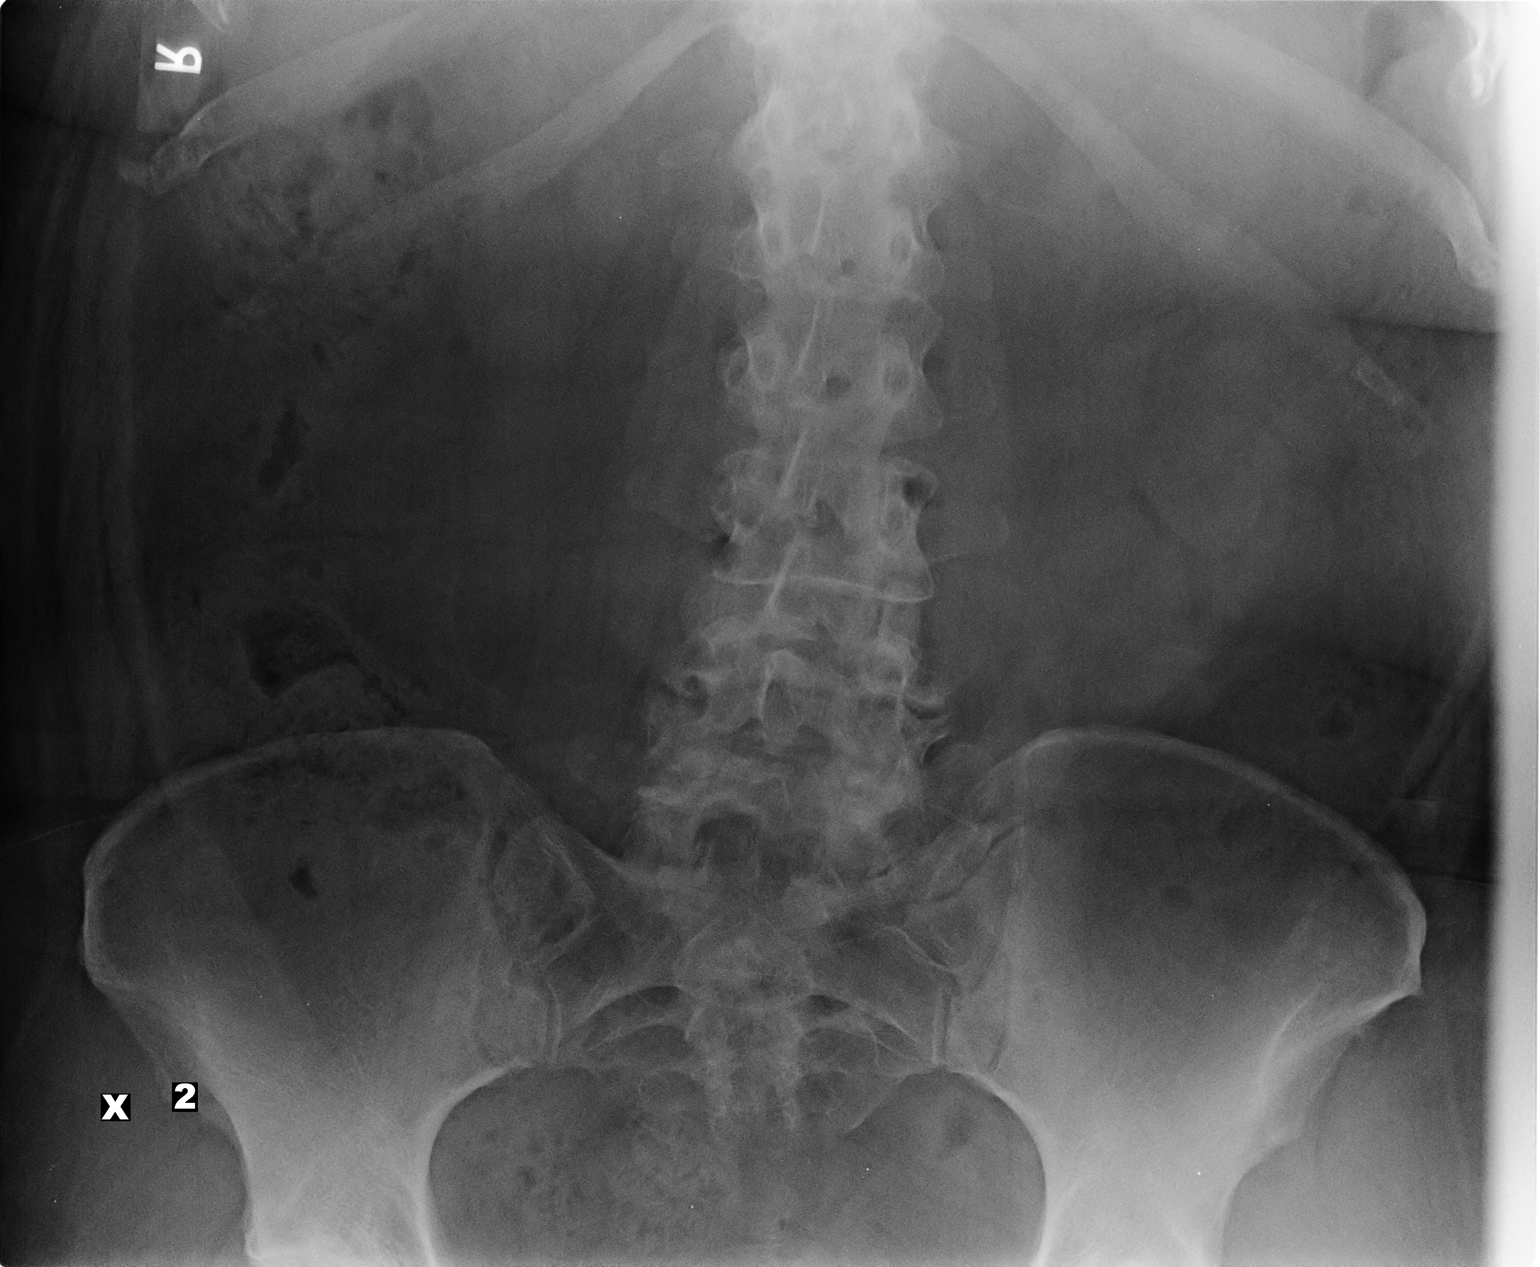

[2 of 2 positions shown; findings below may reference images not displayed]

FINDINGS: Moderate volume of formed stool. Scattered loculated gas present in
the central abdomen; no definite abnormal intra-abdominal mass
effect. No concerning calcification.

Advanced lower lumbar facet osteoarthritis, with pseudoarthrosis
between the transitional lumbosacral vertebra left transverse
process and the sacrum.
IMPRESSION: Nonobstructive bowel gas pattern.  No definitive constipation.

## 2015-02-18 ENCOUNTER — Other Ambulatory Visit: Payer: Self-pay | Admitting: Nurse Practitioner

## 2015-02-18 NOTE — Telephone Encounter (Signed)
Last seen 09/29/14 MMM If approved route to nurse to call into [facility] 

## 2015-02-18 NOTE — Telephone Encounter (Signed)
Needs to be seen for xanax refill

## 2015-02-19 NOTE — Telephone Encounter (Signed)
rx called into pharmacy and patient aware.  

## 2015-03-16 ENCOUNTER — Ambulatory Visit: Payer: PRIVATE HEALTH INSURANCE | Admitting: Nurse Practitioner

## 2015-03-19 ENCOUNTER — Other Ambulatory Visit: Payer: Self-pay | Admitting: Nurse Practitioner

## 2015-03-23 ENCOUNTER — Encounter: Payer: Self-pay | Admitting: Nurse Practitioner

## 2015-03-23 ENCOUNTER — Ambulatory Visit (INDEPENDENT_AMBULATORY_CARE_PROVIDER_SITE_OTHER): Payer: PRIVATE HEALTH INSURANCE | Admitting: Nurse Practitioner

## 2015-03-23 VITALS — BP 138/86 | HR 85 | Temp 97.1°F | Ht 64.0 in | Wt 260.0 lb

## 2015-03-23 DIAGNOSIS — K219 Gastro-esophageal reflux disease without esophagitis: Secondary | ICD-10-CM

## 2015-03-23 DIAGNOSIS — F329 Major depressive disorder, single episode, unspecified: Secondary | ICD-10-CM

## 2015-03-23 DIAGNOSIS — F411 Generalized anxiety disorder: Secondary | ICD-10-CM | POA: Diagnosis not present

## 2015-03-23 DIAGNOSIS — Z1212 Encounter for screening for malignant neoplasm of rectum: Secondary | ICD-10-CM

## 2015-03-23 DIAGNOSIS — F32A Depression, unspecified: Secondary | ICD-10-CM

## 2015-03-23 DIAGNOSIS — E039 Hypothyroidism, unspecified: Secondary | ICD-10-CM

## 2015-03-23 DIAGNOSIS — Z1159 Encounter for screening for other viral diseases: Secondary | ICD-10-CM

## 2015-03-23 MED ORDER — CLONAZEPAM 0.5 MG PO TABS: 0.5000 mg | ORAL_TABLET | Freq: Two times a day (BID) | ORAL | Status: DC | PRN

## 2015-03-23 MED ORDER — RANITIDINE HCL 150 MG PO CAPS: 150.0000 mg | ORAL_CAPSULE | Freq: Two times a day (BID) | ORAL | Status: DC

## 2015-03-23 MED ORDER — ESCITALOPRAM OXALATE 20 MG PO TABS: ORAL_TABLET | ORAL | Status: DC

## 2015-03-23 MED ORDER — LEVOTHYROXINE SODIUM 75 MCG PO TABS: ORAL_TABLET | ORAL | Status: DC

## 2015-03-23 MED ORDER — ALPRAZOLAM 0.25 MG PO TABS: 0.2500 mg | ORAL_TABLET | Freq: Two times a day (BID) | ORAL | Status: DC | PRN

## 2015-03-23 NOTE — Patient Instructions (Signed)

## 2015-03-23 NOTE — Progress Notes (Signed)
Subjective:    Patient ID: Erin Hale, female    DOB: 01/27/1956, 60 y.o.   MRN: [id number]   Patient here today for follow up of chronic medical problems.  Outpatient Encounter Prescriptions as of 03/23/2015  Medication Sig  . acetaminophen (TYLENOL) 650 MG CR tablet Take 650 mg by mouth every 8 (eight) hours as needed for pain.  Marland Kitchen ALPRAZolam (XANAX) 0.25 MG tablet TAKE ONE TABLET BY MOUTH TWICE DAILY AS NEEDED  . diphenhydrAMINE (BENADRYL) 25 mg capsule Take 25 mg by mouth every 6 (six) hours as needed.  Marland Kitchen escitalopram (LEXAPRO) 20 MG tablet TAKE 1 TABLET (20 MG TOTAL) BY MOUTH DAILY.  Marland Kitchen ibuprofen (ADVIL,MOTRIN) 200 MG tablet Take 200 mg by mouth every 6 (six) hours as needed for pain.  Marland Kitchen levothyroxine (SYNTHROID, LEVOTHROID) 75 MCG tablet TAKE 1 TABLET (75 MCG TOTAL)   BY MOUTH DAILY BEFORE BREAKFAST.  . ranitidine (ZANTAC) 150 MG capsule Take 1 capsule (150 mg total) by mouth 2 (two) times daily.   No facility-administered encounter medications on file as of 03/23/2015.    Thyroid Problem Symptoms include weight gain. Patient reports no diarrhea, heat intolerance, palpitations or visual change. The symptoms have been stable. Past treatments include levothyroxine. The treatment provided moderate relief. Her past medical history is significant for obesity.  GERD Zantac- working well- occasional symptoms if she eats certain things Depression Patient currently on cymbalta and works well - no c/o side effects GAD Xanax BID- helps some still gets anxious at times. Would like to go back on klonopin- seemed to work better for her in the past.  Depression screen [facility] 2/9 03/23/2015 09/29/2014 01/05/2014 06/16/2013 10/21/2012  Decreased Interest 1 - 1 0 3  Down, Depressed, Hopeless 0 0 0 2 2  PHQ - 2 Score 1 0 '1 2 5  '$ Altered sleeping - - - 2 3  Tired, decreased energy - - - 2 3  Change in appetite - - - 2 3  Feeling bad or failure about yourself  - - - 0 3  Trouble concentrating - - - 1 1   Moving slowly or fidgety/restless - - - 1 2  Suicidal thoughts - - - 0 0  PHQ-9 Score - - - 10 20    No flowsheet data found.    Review of Systems  Constitutional: Positive for weight gain.  Eyes: Negative.   Respiratory: Negative.   Cardiovascular: Negative for palpitations.  Gastrointestinal: Negative for diarrhea.  Endocrine: Negative for heat intolerance.  Genitourinary: Negative.   Musculoskeletal: Positive for myalgias (left leg).  Neurological: Negative.   Psychiatric/Behavioral: Negative.   All other systems reviewed and are negative.      Objective:   Physical Exam  Constitutional: She is oriented to person, place, and time. She appears well-developed and well-nourished.  HENT:  Head: Normocephalic.  Nose: Nose normal.  Mouth/Throat: Oropharynx is clear and moist.  Eyes: EOM are normal. Pupils are equal, round, and reactive to light.  Neck: Trachea normal, normal range of motion and full passive range of motion without pain. Neck supple. No JVD present. Carotid bruit is not present. No thyromegaly present.  Cardiovascular: Normal rate, regular rhythm, normal heart sounds and intact distal pulses.  Exam reveals no gallop and no friction rub.   No murmur heard. Pulmonary/Chest: Effort normal and breath sounds normal.  Abdominal: Soft. Normal appearance and bowel sounds are normal. She exhibits no shifting dullness, no distension and no mass. There is no tenderness.  Musculoskeletal: Normal range of motion.  Lymphadenopathy:    She has no cervical adenopathy.  Neurological: She is alert and oriented to person, place, and time. She has normal reflexes.  Skin: Skin is warm and dry.  Psychiatric: She has a normal mood and affect. Her behavior is normal. Judgment and thought content normal.    BP 138/86 mmHg  Pulse 85  Temp(Src) 97.1 F (36.2 C) (Oral)  Ht '5\' 4"'$  (1.626 m)  Wt 260 lb (117.935 kg)  BMI 44.61 kg/m2  LMP  (LMP Unknown)   Adella Nissen, FNP       Assessment & Plan:  1. Gastroesophageal reflux disease, esophagitis presence not specified Avoid spicy foods Do not eat 2 hours prior to bedtime - ranitidine (ZANTAC) 150 MG capsule; Take 1 capsule (150 mg total) by mouth 2 (two) times daily.  Dispense: 30 capsule; Refill: 5    2. Hypothyroidism, unspecified hypothyroidism type - CMP14+EGFR - Lipid panel - levothyroxine (SYNTHROID, LEVOTHROID) 75 MCG tablet; TAKE 1 TABLET (75 MCG TOTAL)   BY MOUTH DAILY BEFORE BREAKFAST.  Dispense: 30 tablet; Refill: 5  3. Depression Stress management - escitalopram (LEXAPRO) 20 MG tablet; TAKE 1 TABLET (20 MG TOTAL) BY MOUTH DAILY.  Dispense: 30 tablet; Refill: 5  4. Morbid obesity, unspecified obesity type ([place]) Discussed diet and exercise for person with BMI >25 Will recheck weight in 3-6 months   5. Screening for malignant neoplasm of the rectum - Fecal occult blood, imunochemical; Future  6. Need for hepatitis C screening test - Hepatitis C antibody  7. GAD (generalized anxiety disorder) Stopped xanax and changed to klonopin 0.'5mg'$  BID   Patient encouraged to make appointment for mammo Labs pending Health maintenance reviewed Diet and exercise encouraged Continue all meds Follow up  In 6 month   [place], FNP

## 2015-03-24 LAB — HEPATITIS C ANTIBODY

## 2015-03-24 LAB — LIPID PANEL
Chol/HDL Ratio: 2.9 ratio units (ref 0.0–4.4)
Cholesterol, Total: 188 mg/dL (ref 100–199)
HDL: 65 mg/dL (ref >39)
LDL Calculated: 107 mg/dL — ABNORMAL HIGH (ref 0–99)
TRIGLYCERIDES: 79 mg/dL (ref 0–149)
VLDL Cholesterol Cal: 16 mg/dL (ref 5–40)

## 2015-03-24 LAB — CMP14+EGFR
A/G RATIO: 1.7 (ref 1.1–2.5)
ALT: 14 IU/L (ref 0–32)
AST: 19 IU/L (ref 0–40)
Albumin: 4 g/dL (ref 3.5–5.5)
Alkaline Phosphatase: 114 IU/L (ref 39–117)
BILIRUBIN TOTAL: 0.4 mg/dL (ref 0.0–1.2)
BUN/Creatinine Ratio: 11 (ref 9–23)
BUN: 11 mg/dL (ref 6–24)
CHLORIDE: 102 mmol/L (ref 96–106)
CO2: 23 mmol/L (ref 18–29)
Calcium: 9 mg/dL (ref 8.7–10.2)
Creatinine, Ser: 1.02 mg/dL — ABNORMAL HIGH (ref 0.57–1.00)
GFR calc Af Amer: 70 mL/min/{1.73_m2} (ref >59)
GFR calc non Af Amer: 61 mL/min/{1.73_m2} (ref >59)
Globulin, Total: 2.4 g/dL (ref 1.5–4.5)
Glucose: 122 mg/dL — ABNORMAL HIGH (ref 65–99)
Potassium: 4 mmol/L (ref 3.5–5.2)
Sodium: 143 mmol/L (ref 134–144)
Total Protein: 6.4 g/dL (ref 6.0–8.5)

## 2015-04-05 ENCOUNTER — Ambulatory Visit: Payer: PRIVATE HEALTH INSURANCE | Admitting: Nurse Practitioner

## 2015-04-06 ENCOUNTER — Ambulatory Visit: Payer: PRIVATE HEALTH INSURANCE | Admitting: Nurse Practitioner

## 2015-04-19 ENCOUNTER — Telehealth: Payer: Self-pay | Admitting: Nurse Practitioner

## 2015-05-06 ENCOUNTER — Other Ambulatory Visit: Payer: Self-pay | Admitting: Nurse Practitioner

## 2015-06-18 ENCOUNTER — Other Ambulatory Visit: Payer: Self-pay | Admitting: Nurse Practitioner

## 2015-06-21 NOTE — Telephone Encounter (Signed)
Please call in klonopin with 1 refills 

## 2015-06-21 NOTE — Telephone Encounter (Signed)
Last seen 03/23/15  MMM  If approved route to nurse to call into [facility] 

## 2015-06-21 NOTE — Telephone Encounter (Signed)
rx called into pharmacy

## 2015-07-07 ENCOUNTER — Other Ambulatory Visit: Payer: Self-pay | Admitting: Nurse Practitioner

## 2015-07-08 NOTE — Telephone Encounter (Signed)
Last seen 03/23/15 MMM

## 2015-07-11 ENCOUNTER — Other Ambulatory Visit: Payer: Self-pay | Admitting: Nurse Practitioner

## 2015-08-17 ENCOUNTER — Other Ambulatory Visit: Payer: Self-pay | Admitting: Nurse Practitioner

## 2015-08-17 NOTE — Telephone Encounter (Signed)
Last seen 03/23/15  MMM  If approved route to nurse to call into [facility] 

## 2015-09-14 ENCOUNTER — Other Ambulatory Visit: Payer: Self-pay | Admitting: Family

## 2015-09-15 NOTE — Telephone Encounter (Signed)
Last seen 03/23/15  MMM  If approved route to nurse to call into [facility] 

## 2015-09-15 NOTE — Telephone Encounter (Signed)
Please call in clonazepam with 0 refills Last refill without being seen  

## 2015-09-21 ENCOUNTER — Encounter: Payer: Self-pay | Admitting: Nurse Practitioner

## 2015-09-21 ENCOUNTER — Ambulatory Visit (INDEPENDENT_AMBULATORY_CARE_PROVIDER_SITE_OTHER): Payer: PRIVATE HEALTH INSURANCE | Admitting: Nurse Practitioner

## 2015-09-21 VITALS — BP 114/73 | HR 79 | Temp 97.1°F | Ht 64.0 in | Wt 274.0 lb

## 2015-09-21 DIAGNOSIS — K219 Gastro-esophageal reflux disease without esophagitis: Secondary | ICD-10-CM | POA: Diagnosis not present

## 2015-09-21 DIAGNOSIS — Z1212 Encounter for screening for malignant neoplasm of rectum: Secondary | ICD-10-CM | POA: Diagnosis not present

## 2015-09-21 DIAGNOSIS — F329 Major depressive disorder, single episode, unspecified: Secondary | ICD-10-CM | POA: Diagnosis not present

## 2015-09-21 DIAGNOSIS — E039 Hypothyroidism, unspecified: Secondary | ICD-10-CM | POA: Diagnosis not present

## 2015-09-21 DIAGNOSIS — F32A Depression, unspecified: Secondary | ICD-10-CM

## 2015-09-21 MED ORDER — ESCITALOPRAM OXALATE 20 MG PO TABS: 20.0000 mg | ORAL_TABLET | Freq: Every day | ORAL | Status: DC

## 2015-09-21 MED ORDER — CLONAZEPAM 0.5 MG PO TABS: 0.5000 mg | ORAL_TABLET | Freq: Two times a day (BID) | ORAL | Status: DC | PRN

## 2015-09-21 MED ORDER — RANITIDINE HCL 150 MG PO CAPS: 150.0000 mg | ORAL_CAPSULE | Freq: Two times a day (BID) | ORAL | Status: DC

## 2015-09-21 MED ORDER — LEVOTHYROXINE SODIUM 75 MCG PO TABS: ORAL_TABLET | ORAL | Status: DC

## 2015-09-21 NOTE — Progress Notes (Signed)
Subjective:    Patient ID: Erin Hale, female    DOB: 02/16/1956, 60 y.o.   MRN: [id number]   Patient here today for follow up of chronic medical problems.  Outpatient Encounter Prescriptions as of 09/21/2015  Medication Sig  . acetaminophen (TYLENOL) 650 MG CR tablet Take 650 mg by mouth every 8 (eight) hours as needed for pain.  . clonazePAM (KLONOPIN) 0.5 MG tablet TAKE ONE TABLET BY MOUTH TWICE DAILY AS NEEDED  . diphenhydrAMINE (BENADRYL) 25 mg capsule Take 25 mg by mouth every 6 (six) hours as needed.  Marland Kitchen escitalopram (LEXAPRO) 20 MG tablet TAKE ONE TABLET BY MOUTH ONCE DAILY  . ibuprofen (ADVIL,MOTRIN) 200 MG tablet Take 200 mg by mouth every 6 (six) hours as needed for pain.  Marland Kitchen levothyroxine (SYNTHROID, LEVOTHROID) 75 MCG tablet TAKE 1 TABLET (75 MCG TOTAL)   BY MOUTH DAILY BEFORE BREAKFAST.  . ranitidine (ZANTAC) 150 MG capsule Take 1 capsule (150 mg total) by mouth 2 (two) times daily.   Thyroid Problem Symptoms include weight gain. Patient reports no diarrhea, heat intolerance, palpitations or visual change. The symptoms have been stable. Past treatments include levothyroxine. The treatment provided moderate relief. Her past medical history is significant for obesity.  GERD Zantac- working well- occasional symptoms if she eats certain things Depression Patient currently on cymbalta and works well - no c/o side effects GAD Xanax BID- helps some still gets anxious at times. Would like to go back on klonopin- seemed to work better for her in the past.  Depression screen [facility] 2/9 09/21/2015 03/23/2015 09/29/2014 01/05/2014 06/16/2013  Decreased Interest 1 1 - 1 0  Down, Depressed, Hopeless 0 0 0 0 2  PHQ - 2 Score 1 1 0 1 2  Altered sleeping - - - - 2  Tired, decreased energy - - - - 2  Change in appetite - - - - 2  Feeling bad or failure about yourself  - - - - 0  Trouble concentrating - - - - 1  Moving slowly or fidgety/restless - - - - 1  Suicidal thoughts - - - - 0  PHQ-9  Score - - - - 10   GAD 7 : Generalized Anxiety Score 09/21/2015 03/23/2015  Nervous, Anxious, on Edge 2 3  Control/stop worrying 2 2  Worry too much - different things 1 2  Trouble relaxing 1 1  Restless 1 1  Easily annoyed or irritable 1 1  Afraid - awful might happen 1 0  Total GAD 7 Score 9 10  Anxiety Difficulty Somewhat difficult Somewhat difficult           Review of Systems  Constitutional: Positive for weight gain.  Eyes: Negative.   Respiratory: Negative.   Cardiovascular: Negative for palpitations.  Gastrointestinal: Negative for diarrhea.  Endocrine: Negative for heat intolerance.  Genitourinary: Negative.   Musculoskeletal: Positive for myalgias (left leg).  Neurological: Negative.   Psychiatric/Behavioral: Negative.   All other systems reviewed and are negative.      Objective:   Physical Exam  Constitutional: She is oriented to person, place, and time. She appears well-developed and well-nourished.  HENT:  Head: Normocephalic.  Nose: Nose normal.  Mouth/Throat: Oropharynx is clear and moist.  Eyes: EOM are normal. Pupils are equal, round, and reactive to light.  Neck: Trachea normal, normal range of motion and full passive range of motion without pain. Neck supple. No JVD present. Carotid bruit is not present. No thyromegaly present.  Cardiovascular: Normal rate,  regular rhythm, normal heart sounds and intact distal pulses.  Exam reveals no gallop and no friction rub.   No murmur heard. Pulmonary/Chest: Effort normal and breath sounds normal.  Abdominal: Soft. Normal appearance and bowel sounds are normal. She exhibits no shifting dullness, no distension and no mass. There is no tenderness.  Musculoskeletal: Normal range of motion.  Lymphadenopathy:    She has no cervical adenopathy.  Neurological: She is alert and oriented to person, place, and time. She has normal reflexes.  Skin: Skin is warm and dry.  Psychiatric: She has a normal mood and affect.  Her behavior is normal. Judgment and thought content normal.    BP 114/73 mmHg  Pulse 79  Temp(Src) 97.1 F (36.2 C) (Oral)  Ht 5' 4" (1.626 m)  Wt 274 lb (124.286 kg)  BMI 47.01 kg/m2  LMP  (LMP Unknown)       Assessment & Plan:  1. Gastroesophageal reflux disease without esophagitis Avoid spicy foods Do not eat 2 hours prior to bedtime - ranitidine (ZANTAC) 150 MG capsule; Take 1 capsule (150 mg total) by mouth 2 (two) times daily.  Dispense: 60 capsule; Refill: 5  2. Hypothyroidism, unspecified hypothyroidism type - CMP14+EGFR - Lipid panel - levothyroxine (SYNTHROID, LEVOTHROID) 75 MCG tablet; TAKE 1 TABLET (75 MCG TOTAL)   BY MOUTH DAILY BEFORE BREAKFAST.  Dispense: 30 tablet; Refill: 5 - Thyroid Panel With TSH  3. Depression Stress management - escitalopram (LEXAPRO) 20 MG tablet; Take 1 tablet (20 mg total) by mouth daily.  Dispense: 30 tablet; Refill: 5 - clonazePAM (KLONOPIN) 0.5 MG tablet; Take 1 tablet (0.5 mg total) by mouth 2 (two) times daily as needed.  Dispense: 60 tablet; Refill: 2  4. Morbid obesity, unspecified obesity type ([place]) Discussed diet and exercise for person with BMI >25 Will recheck weight in 3-6 months  5. Screening for malignant neoplasm of the rectum - Fecal occult blood, imunochemical; Future    Labs pending Health maintenance reviewed Diet and exercise encouraged month Continue all meds Follow up  In [place], [place]

## 2015-09-21 NOTE — Patient Instructions (Signed)

## 2015-09-22 LAB — CMP14+EGFR
A/G RATIO: 1.7 (ref 1.2–2.2)
ALT: 15 IU/L (ref 0–32)
AST: 19 IU/L (ref 0–40)
Albumin: 4.1 g/dL (ref 3.5–5.5)
Alkaline Phosphatase: 104 IU/L (ref 39–117)
BUN/Creatinine Ratio: 19 (ref 9–23)
BUN: 17 mg/dL (ref 6–24)
Bilirubin Total: 0.3 mg/dL (ref 0.0–1.2)
CALCIUM: 9.5 mg/dL (ref 8.7–10.2)
CO2: 23 mmol/L (ref 18–29)
CREATININE: 0.9 mg/dL (ref 0.57–1.00)
Chloride: 105 mmol/L (ref 96–106)
GFR, EST AFRICAN AMERICAN: 81 mL/min/{1.73_m2} (ref >59)
GFR, EST NON AFRICAN AMERICAN: 70 mL/min/{1.73_m2} (ref >59)
GLOBULIN, TOTAL: 2.4 g/dL (ref 1.5–4.5)
Glucose: 112 mg/dL — ABNORMAL HIGH (ref 65–99)
Potassium: 4.3 mmol/L (ref 3.5–5.2)
Sodium: 144 mmol/L (ref 134–144)
TOTAL PROTEIN: 6.5 g/dL (ref 6.0–8.5)

## 2015-09-22 LAB — LIPID PANEL
CHOL/HDL RATIO: 2.8 ratio (ref 0.0–4.4)
Cholesterol, Total: 210 mg/dL — ABNORMAL HIGH (ref 100–199)
HDL: 76 mg/dL (ref >39)
LDL CALC: 119 mg/dL — AB (ref 0–99)
TRIGLYCERIDES: 73 mg/dL (ref 0–149)
VLDL Cholesterol Cal: 15 mg/dL (ref 5–40)

## 2015-09-22 LAB — THYROID PANEL WITH TSH
Free Thyroxine Index: 2.6 (ref 1.2–4.9)
T3 Uptake Ratio: 30 % (ref 24–39)
T4, Total: 8.6 ug/dL (ref 4.5–12.0)
TSH: 3.11 u[IU]/mL (ref 0.450–4.500)

## 2015-09-28 ENCOUNTER — Telehealth: Payer: Self-pay | Admitting: Nurse Practitioner

## 2016-01-18 ENCOUNTER — Other Ambulatory Visit: Payer: Self-pay | Admitting: Nurse Practitioner

## 2016-01-18 DIAGNOSIS — F3341 Major depressive disorder, recurrent, in partial remission: Secondary | ICD-10-CM

## 2016-01-18 MED ORDER — CLONAZEPAM 0.5 MG PO TABS: 0.5000 mg | ORAL_TABLET | Freq: Two times a day (BID) | ORAL | 1 refills | Status: DC | PRN

## 2016-01-18 NOTE — Telephone Encounter (Signed)
Please call in klonopin 0.5 1 po BId #[id number] with 1 refills

## 2016-01-19 NOTE — Telephone Encounter (Signed)
Rx called in. Left message rx requested was sent to pharmacy.

## 2016-03-15 ENCOUNTER — Other Ambulatory Visit: Payer: Self-pay | Admitting: Nurse Practitioner

## 2016-03-15 DIAGNOSIS — E039 Hypothyroidism, unspecified: Secondary | ICD-10-CM

## 2016-03-16 ENCOUNTER — Other Ambulatory Visit: Payer: Self-pay | Admitting: Nurse Practitioner

## 2016-03-16 DIAGNOSIS — E039 Hypothyroidism, unspecified: Secondary | ICD-10-CM

## 2016-03-27 ENCOUNTER — Ambulatory Visit: Payer: PRIVATE HEALTH INSURANCE | Admitting: Nurse Practitioner

## 2016-03-28 ENCOUNTER — Encounter: Payer: Self-pay | Admitting: Nurse Practitioner

## 2016-04-04 ENCOUNTER — Ambulatory Visit: Payer: PRIVATE HEALTH INSURANCE | Admitting: Nurse Practitioner

## 2016-04-06 ENCOUNTER — Other Ambulatory Visit: Payer: Self-pay | Admitting: Nurse Practitioner

## 2016-04-06 DIAGNOSIS — F32A Depression, unspecified: Secondary | ICD-10-CM

## 2016-04-06 DIAGNOSIS — F329 Major depressive disorder, single episode, unspecified: Secondary | ICD-10-CM

## 2016-05-02 ENCOUNTER — Encounter: Payer: Self-pay | Admitting: Nurse Practitioner

## 2016-05-02 ENCOUNTER — Ambulatory Visit (INDEPENDENT_AMBULATORY_CARE_PROVIDER_SITE_OTHER): Payer: PRIVATE HEALTH INSURANCE | Admitting: Nurse Practitioner

## 2016-05-02 VITALS — BP 107/71 | HR 75 | Temp 97.1°F

## 2016-05-02 DIAGNOSIS — Z1211 Encounter for screening for malignant neoplasm of colon: Secondary | ICD-10-CM

## 2016-05-02 DIAGNOSIS — F3341 Major depressive disorder, recurrent, in partial remission: Secondary | ICD-10-CM

## 2016-05-02 DIAGNOSIS — Z1212 Encounter for screening for malignant neoplasm of rectum: Secondary | ICD-10-CM

## 2016-05-02 DIAGNOSIS — E039 Hypothyroidism, unspecified: Secondary | ICD-10-CM

## 2016-05-02 DIAGNOSIS — K219 Gastro-esophageal reflux disease without esophagitis: Secondary | ICD-10-CM | POA: Diagnosis not present

## 2016-05-02 DIAGNOSIS — F3342 Major depressive disorder, recurrent, in full remission: Secondary | ICD-10-CM | POA: Diagnosis not present

## 2016-05-02 MED ORDER — RANITIDINE HCL 150 MG PO CAPS: 150.0000 mg | ORAL_CAPSULE | Freq: Two times a day (BID) | ORAL | 5 refills | Status: DC

## 2016-05-02 MED ORDER — CLONAZEPAM 0.5 MG PO TABS: 0.5000 mg | ORAL_TABLET | Freq: Two times a day (BID) | ORAL | 1 refills | Status: DC | PRN

## 2016-05-02 MED ORDER — LEVOTHYROXINE SODIUM 75 MCG PO TABS: 75.0000 ug | ORAL_TABLET | Freq: Every day | ORAL | 5 refills | Status: DC

## 2016-05-02 NOTE — Progress Notes (Signed)
Subjective:    Patient ID: Erin Hale, female    DOB: 12/01/1955, 61 y.o.   MRN: [id number]   Patient here today for follow up of chronic medical problems.  Outpatient Encounter Prescriptions as of 09/21/2015  Medication Sig  . acetaminophen (TYLENOL) 650 MG CR tablet Take 650 mg by mouth every 8 (eight) hours as needed for pain.  . clonazePAM (KLONOPIN) 0.5 MG tablet TAKE ONE TABLET BY MOUTH TWICE DAILY AS NEEDED  . diphenhydrAMINE (BENADRYL) 25 mg capsule Take 25 mg by mouth every 6 (six) hours as needed.  . escitalopram (LEXAPRO) 20 MG tablet TAKE ONE TABLET BY MOUTH ONCE DAILY  . ibuprofen (ADVIL,MOTRIN) 200 MG tablet Take 200 mg by mouth every 6 (six) hours as needed for pain.  . levothyroxine (SYNTHROID, LEVOTHROID) 75 MCG tablet TAKE 1 TABLET (75 MCG TOTAL)   BY MOUTH DAILY BEFORE BREAKFAST.  . ranitidine (ZANTAC) 150 MG capsule Take 1 capsule (150 mg total) by mouth 2 (two) times daily.   Thyroid Problem  Symptoms include weight gain. Patient reports no diarrhea, heat intolerance, palpitations or visual change. The symptoms have been stable. Past treatments include levothyroxine. The treatment provided moderate relief. Her past medical history is significant for obesity.  GERD Zantac- working well- occasional symptoms if she eats certain things Depression Patient currently on cymbalta and works well - no c/o side effects GAD Xanax BID- helps some still gets anxious at times. Would like to go back on klonopin- seemed to work better for her in the past.  Depression screen PHQ 2/9 05/02/2016 09/21/2015 03/23/2015 09/29/2014 01/05/2014  Decreased Interest 0 1 1 - 1  Down, Depressed, Hopeless 0 0 0 0 0  PHQ - 2 Score 0 1 1 0 1  Altered sleeping - - - - -  Tired, decreased energy - - - - -  Change in appetite - - - - -  Feeling bad or failure about yourself  - - - - -  Trouble concentrating - - - - -  Moving slowly or fidgety/restless - - - - -  Suicidal thoughts - - - - -  PHQ-9  Score - - - - -   GAD 7 : Generalized Anxiety Score 09/21/2015 03/23/2015  Nervous, Anxious, on Edge 2 3  Control/stop worrying 2 2  Worry too much - different things 1 2  Trouble relaxing 1 1  Restless 1 1  Easily annoyed or irritable 1 1  Afraid - awful might happen 1 0  Total GAD 7 Score 9 10  Anxiety Difficulty Somewhat difficult Somewhat difficult     Review of Systems  Constitutional: Positive for weight gain.  Eyes: Negative.   Respiratory: Negative.   Cardiovascular: Negative for palpitations.  Gastrointestinal: Negative for diarrhea.  Endocrine: Negative for heat intolerance.  Genitourinary: Negative.   Musculoskeletal: Positive for myalgias (left leg).  Neurological: Negative.   Psychiatric/Behavioral: Negative.   All other systems reviewed and are negative.      Objective:   Physical Exam  Constitutional: She is oriented to person, place, and time. She appears well-developed and well-nourished.  HENT:  Head: Normocephalic.  Nose: Nose normal.  Mouth/Throat: Oropharynx is clear and moist.  Eyes: EOM are normal. Pupils are equal, round, and reactive to light.  Neck: Trachea normal, normal range of motion and full passive range of motion without pain. Neck supple. No JVD present. Carotid bruit is not present. No thyromegaly present.  Cardiovascular: Normal rate, regular rhythm, normal heart sounds   and intact distal pulses.  Exam reveals no gallop and no friction rub.   No murmur heard. Pulmonary/Chest: Effort normal and breath sounds normal.  Abdominal: Soft. Normal appearance and bowel sounds are normal. She exhibits no shifting dullness, no distension and no mass. There is no tenderness.  Musculoskeletal: Normal range of motion.  Lymphadenopathy:    She has no cervical adenopathy.  Neurological: She is alert and oriented to person, place, and time. She has normal reflexes.  Skin: Skin is warm and dry.  Psychiatric: She has a normal mood and affect. Her behavior  is normal. Judgment and thought content normal.   BP 107/71   Pulse 75   Temp 97.1 F (36.2 C) (Oral)   LMP  (LMP Unknown)   LMP  (LMP Unknown)        Assessment & Plan:  1. Gastroesophageal reflux disease, esophagitis presence not specified Avoid spicy foods Do not eat 2 hours prior to bedtime  2. Hypothyroidism, unspecified type - CMP14+EGFR - Thyroid Panel With TSH  3. Recurrent major depressive disorder, in full remission (HCC) Stress management  clonazePAM (KLONOPIN) 0.5 MG tablet; Take 1 tablet (0.5 mg total) by mouth 2 (two) times daily as needed.  Dispense: 60 tablet; Refill: 1   4. Severe obesity (BMI >= 40) (HCC) Discussed diet and exercise for person with BMI >25 Will recheck weight in 3-6 months - Lipid panel  5. Screening for colorectal cancer - Fecal occult blood, imunochemical; Future  6. Gastroesophageal reflux disease without esophagitis Avoid spicy foods Do not eat 2 hours prior to bedtime - ranitidine (ZANTAC) 150 MG capsule; Take 1 capsule (150 mg total) by mouth 2 (two) times daily.  Dispense: 60 capsule; Refill: 5  7. Acquired hypothyroidism - levothyroxine (SYNTHROID, LEVOTHROID) 75 MCG tablet; Take 1 tablet (75 mcg total) by mouth daily before breakfast.  Dispense: 30 tablet; Refill: 5  -   Labs pending Health maintenance reviewed Diet and exercise encouraged Continue all meds Follow up  In 6 months   Mary-Margaret Martin, FNP   

## 2016-05-02 NOTE — Patient Instructions (Signed)
Cholesterol Cholesterol is a fat. Your body needs a small amount of cholesterol. Cholesterol (plaque) may build up in your blood vessels (arteries). That makes you more likely to have a heart attack or stroke. You cannot feel your cholesterol level. Having a blood test is the only way to find out if your level is high. Keep your test results. Work with your doctor to keep your cholesterol at a good level. What do the results mean?  Total cholesterol is how much cholesterol is in your blood.  LDL is bad cholesterol. This is the type that can build up. Try to have low LDL.  HDL is good cholesterol. It cleans your blood vessels and carries LDL away. Try to have high HDL.  Triglycerides are fat that the body can store or burn for energy. What are good levels of cholesterol?  Total cholesterol below 200.  LDL below 100 is good for people who have health risks. LDL below 70 is good for people who have very high risks.  HDL above 40 is good. It is best to have HDL of 60 or higher.  Triglycerides below 150. How can I lower my cholesterol? Diet  Follow your diet program as told by your doctor.  Choose fish, white meat chicken, or turkey that is roasted or baked. Try not to eat red meat, fried foods, sausage, or lunch meats.  Eat lots of fresh fruits and vegetables.  Choose whole grains, beans, pasta, potatoes, and cereals.  Choose olive oil, corn oil, or canola oil. Only use small amounts.  Try not to eat butter, mayonnaise, shortening, or palm kernel oils.  Try not to eat foods with trans fats.  Choose low-fat or nonfat dairy foods.  Drink skim or nonfat milk.  Eat low-fat or nonfat yogurt and cheeses.  Try not to drink whole milk or cream.  Try not to eat ice cream, egg yolks, or full-fat cheeses.  Healthy desserts include angel food cake, ginger snaps, animal crackers, hard candy, popsicles, and low-fat or nonfat frozen yogurt. Try not to eat pastries, cakes, pies, and  cookies. Exercise  Follow your exercise program as told by your doctor.  Be more active. Try gardening, walking, and taking the stairs.  Ask your doctor about ways that you can be more active. Medicine  Take over-the-counter and prescription medicines only as told by your doctor. This information is not intended to replace advice given to you by your health care provider. Make sure you discuss any questions you have with your health care provider. Document Released: 05/19/2008 Document Revised: 09/22/2015 Document Reviewed: 09/02/2015 Elsevier Interactive Patient Education  2017 [employer]  

## 2016-05-03 LAB — CMP14+EGFR
ALBUMIN: 4 g/dL (ref 3.5–5.5)
ALT: 20 IU/L (ref 0–32)
AST: 20 IU/L (ref 0–40)
Albumin/Globulin Ratio: 1.5 (ref 1.2–2.2)
Alkaline Phosphatase: 105 IU/L (ref 39–117)
BUN/Creatinine Ratio: 17 (ref 9–23)
BUN: 15 mg/dL (ref 6–24)
Bilirubin Total: 0.5 mg/dL (ref 0.0–1.2)
CALCIUM: 9 mg/dL (ref 8.7–10.2)
CO2: 26 mmol/L (ref 18–29)
CREATININE: 0.88 mg/dL (ref 0.57–1.00)
Chloride: 102 mmol/L (ref 96–106)
GFR calc Af Amer: 83 mL/min/{1.73_m2} (ref >59)
GFR, EST NON AFRICAN AMERICAN: 72 mL/min/{1.73_m2} (ref >59)
GLOBULIN, TOTAL: 2.7 g/dL (ref 1.5–4.5)
Glucose: 102 mg/dL — ABNORMAL HIGH (ref 65–99)
Potassium: 3.9 mmol/L (ref 3.5–5.2)
SODIUM: 143 mmol/L (ref 134–144)
Total Protein: 6.7 g/dL (ref 6.0–8.5)

## 2016-05-03 LAB — LIPID PANEL
CHOL/HDL RATIO: 3.3 ratio (ref 0.0–4.4)
Cholesterol, Total: 196 mg/dL (ref 100–199)
HDL: 60 mg/dL (ref >39)
LDL CALC: 111 mg/dL — AB (ref 0–99)
TRIGLYCERIDES: 123 mg/dL (ref 0–149)
VLDL Cholesterol Cal: 25 mg/dL (ref 5–40)

## 2016-05-03 LAB — THYROID PANEL WITH TSH
Free Thyroxine Index: 3.5 (ref 1.2–4.9)
T3 UPTAKE RATIO: 36 % (ref 24–39)
T4, Total: 9.7 ug/dL (ref 4.5–12.0)
TSH: 2.23 u[IU]/mL (ref 0.450–4.500)

## 2016-07-06 ENCOUNTER — Other Ambulatory Visit: Payer: Self-pay | Admitting: Nurse Practitioner

## 2016-07-06 DIAGNOSIS — F329 Major depressive disorder, single episode, unspecified: Secondary | ICD-10-CM

## 2016-07-06 DIAGNOSIS — F32A Depression, unspecified: Secondary | ICD-10-CM

## 2016-07-11 ENCOUNTER — Other Ambulatory Visit: Payer: Self-pay | Admitting: Nurse Practitioner

## 2016-07-11 DIAGNOSIS — F3341 Major depressive disorder, recurrent, in partial remission: Secondary | ICD-10-CM

## 2016-07-12 ENCOUNTER — Telehealth: Payer: Self-pay | Admitting: Nurse Practitioner

## 2016-07-12 DIAGNOSIS — F3341 Major depressive disorder, recurrent, in partial remission: Secondary | ICD-10-CM

## 2016-07-12 NOTE — Telephone Encounter (Signed)
Last filled 05/02/16 #60 R-1 Please advise

## 2016-07-12 NOTE — Telephone Encounter (Signed)
Covering PCP, please advise.  

## 2016-07-12 NOTE — Telephone Encounter (Signed)
What is the name of the medication? clonazepam  Have you contacted your pharmacy to request a refill? yes  Which pharmacy would you like this sent to? [facility] in [place].   Patient notified that their request is being sent to the clinical staff for review and that they should receive a call once it is complete. If they do not receive a call within 24 hours they can check with their pharmacy or our office.

## 2016-07-12 NOTE — Telephone Encounter (Signed)
Forwarding to PCP/MMM 

## 2016-07-12 NOTE — Telephone Encounter (Signed)
Last filled 06/11/16, last seen 05/02/16. Call in

## 2016-07-13 MED ORDER — CLONAZEPAM 0.5 MG PO TABS: 0.5000 mg | ORAL_TABLET | Freq: Two times a day (BID) | ORAL | 0 refills | Status: DC | PRN

## 2016-07-13 NOTE — Telephone Encounter (Signed)
RX called in .

## 2016-07-13 NOTE — Telephone Encounter (Signed)
Please call in clonazepam with 0 refills 

## 2016-07-13 NOTE — Telephone Encounter (Signed)
Please call in klonopin 0.5mg  1 po BID #[id number]  with 0 refills

## 2016-07-13 NOTE — Telephone Encounter (Signed)
Rx called in 

## 2016-08-13 ENCOUNTER — Other Ambulatory Visit: Payer: Self-pay | Admitting: Nurse Practitioner

## 2016-08-13 DIAGNOSIS — F3341 Major depressive disorder, recurrent, in partial remission: Secondary | ICD-10-CM

## 2016-08-14 NOTE — Telephone Encounter (Signed)
Rx phoned in.   

## 2016-09-06 ENCOUNTER — Other Ambulatory Visit: Payer: Self-pay | Admitting: Nurse Practitioner

## 2016-09-06 DIAGNOSIS — F329 Major depressive disorder, single episode, unspecified: Secondary | ICD-10-CM

## 2016-09-06 DIAGNOSIS — F32A Depression, unspecified: Secondary | ICD-10-CM

## 2016-09-11 ENCOUNTER — Other Ambulatory Visit: Payer: Self-pay | Admitting: Family

## 2016-09-11 DIAGNOSIS — F3341 Major depressive disorder, recurrent, in partial remission: Secondary | ICD-10-CM

## 2016-09-12 NOTE — Telephone Encounter (Signed)
Rx called to pharmacy

## 2016-09-12 NOTE — Telephone Encounter (Signed)
Please call in clonazepam with 0 refills Last refill without being seen  

## 2016-09-12 NOTE — Telephone Encounter (Signed)
Last seen 05/02/16  MMM  If approved route to nurse to call into [facility]

## 2016-10-10 ENCOUNTER — Other Ambulatory Visit: Payer: Self-pay | Admitting: Nurse Practitioner

## 2016-10-10 DIAGNOSIS — F3341 Major depressive disorder, recurrent, in partial remission: Secondary | ICD-10-CM

## 2016-10-11 NOTE — Telephone Encounter (Signed)
Last seen 05/02/16  MMM  If approved route to nurse to call into [facility]

## 2016-10-12 NOTE — Telephone Encounter (Signed)
Last refill without being seen' Please call in clonazepam with 0 refills

## 2016-10-31 ENCOUNTER — Ambulatory Visit: Payer: PRIVATE HEALTH INSURANCE | Admitting: Nurse Practitioner

## 2016-11-06 ENCOUNTER — Other Ambulatory Visit: Payer: Self-pay | Admitting: Nurse Practitioner

## 2016-11-06 DIAGNOSIS — F32A Depression, unspecified: Secondary | ICD-10-CM

## 2016-11-06 DIAGNOSIS — F329 Major depressive disorder, single episode, unspecified: Secondary | ICD-10-CM

## 2016-11-07 NOTE — Telephone Encounter (Signed)
Next OV 11/14/16

## 2016-11-10 ENCOUNTER — Other Ambulatory Visit: Payer: Self-pay | Admitting: Nurse Practitioner

## 2016-11-10 DIAGNOSIS — F3341 Major depressive disorder, recurrent, in partial remission: Secondary | ICD-10-CM

## 2016-11-10 NOTE — Telephone Encounter (Signed)
Last seen 05/02/16  MMM  IF approved route to nurse to call into [facility]

## 2016-11-10 NOTE — Telephone Encounter (Signed)
Please call in clonazepam with 0 refills

## 2016-11-13 NOTE — Telephone Encounter (Signed)
Rx called to pharmacy

## 2016-11-14 ENCOUNTER — Ambulatory Visit (INDEPENDENT_AMBULATORY_CARE_PROVIDER_SITE_OTHER): Payer: PRIVATE HEALTH INSURANCE | Admitting: Nurse Practitioner

## 2016-11-14 ENCOUNTER — Ambulatory Visit (INDEPENDENT_AMBULATORY_CARE_PROVIDER_SITE_OTHER): Payer: PRIVATE HEALTH INSURANCE

## 2016-11-14 ENCOUNTER — Encounter: Payer: Self-pay | Admitting: Nurse Practitioner

## 2016-11-14 VITALS — BP 113/72 | HR 77 | Temp 97.2°F | Ht 64.0 in | Wt 272.0 lb

## 2016-11-14 DIAGNOSIS — F3342 Major depressive disorder, recurrent, in full remission: Secondary | ICD-10-CM

## 2016-11-14 DIAGNOSIS — R300 Dysuria: Secondary | ICD-10-CM | POA: Diagnosis not present

## 2016-11-14 DIAGNOSIS — K219 Gastro-esophageal reflux disease without esophagitis: Secondary | ICD-10-CM

## 2016-11-14 DIAGNOSIS — R198 Other specified symptoms and signs involving the digestive system and abdomen: Secondary | ICD-10-CM

## 2016-11-14 DIAGNOSIS — E039 Hypothyroidism, unspecified: Secondary | ICD-10-CM | POA: Diagnosis not present

## 2016-11-14 DIAGNOSIS — F3341 Major depressive disorder, recurrent, in partial remission: Secondary | ICD-10-CM

## 2016-11-14 LAB — MICROSCOPIC EXAMINATION
RBC, UA: NONE SEEN /hpf (ref 0–?)
Renal Epithel, UA: NONE SEEN /hpf

## 2016-11-14 LAB — URINALYSIS, COMPLETE
Bilirubin, UA: NEGATIVE
GLUCOSE, UA: NEGATIVE
KETONES UA: NEGATIVE
LEUKOCYTES UA: NEGATIVE
Nitrite, UA: NEGATIVE
RBC, UA: NEGATIVE
SPEC GRAV UA: 1.025 (ref 1.005–1.030)
Urobilinogen, Ur: 0.2 mg/dL (ref 0.2–1.0)
pH, UA: 6 (ref 5.0–7.5)

## 2016-11-14 MED ORDER — CLONAZEPAM 0.5 MG PO TABS: 0.5000 mg | ORAL_TABLET | Freq: Two times a day (BID) | ORAL | 2 refills | Status: DC | PRN

## 2016-11-14 MED ORDER — RANITIDINE HCL 150 MG PO CAPS: 150.0000 mg | ORAL_CAPSULE | Freq: Two times a day (BID) | ORAL | 5 refills | Status: DC

## 2016-11-14 MED ORDER — ESCITALOPRAM OXALATE 20 MG PO TABS: 20.0000 mg | ORAL_TABLET | Freq: Every day | ORAL | 5 refills | Status: DC

## 2016-11-14 MED ORDER — BUPROPION HCL ER (XL) 150 MG PO TB24: 150.0000 mg | ORAL_TABLET | Freq: Every day | ORAL | 5 refills | Status: DC

## 2016-11-14 MED ORDER — LEVOTHYROXINE SODIUM 75 MCG PO TABS: 75.0000 ug | ORAL_TABLET | Freq: Every day | ORAL | 5 refills | Status: DC

## 2016-11-14 NOTE — Addendum Note (Signed)
Addended by: Rolena Infante on: 11/14/2016 04:31 PM   Modules accepted: Orders

## 2016-11-14 NOTE — Progress Notes (Signed)
Subjective:    Patient ID: Erin Hale, female    DOB: Aug 31, 1955, 61 y.o.   MRN: [id number]  HPI CHERYLEE RAWLINSON is here today for follow up of chronic medical problem.  Outpatient Encounter Prescriptions as of 11/14/2016  Medication Sig  . acetaminophen (TYLENOL) 650 MG CR tablet Take 650 mg by mouth every 8 (eight) hours as needed for pain.  . clonazePAM (KLONOPIN) 0.5 MG tablet TAKE 1 TABLET BY MOUTH TWICE DAILY AS NEEDED  . diphenhydrAMINE (BENADRYL) 25 mg capsule Take 25 mg by mouth every 6 (six) hours as needed.  Marland Kitchen escitalopram (LEXAPRO) 20 MG tablet TAKE 1 TABLET BY MOUTH ONCE DAILY  . ibuprofen (ADVIL,MOTRIN) 200 MG tablet Take 200 mg by mouth every 6 (six) hours as needed for pain.  Marland Kitchen levothyroxine (SYNTHROID, LEVOTHROID) 75 MCG tablet Take 1 tablet (75 mcg total) by mouth daily before breakfast.  . Melatonin 5 MG CAPS Take by mouth.  . ranitidine (ZANTAC) 150 MG capsule Take 1 capsule (150 mg total) by mouth 2 (two) times daily.   No facility-administered encounter medications on file as of 11/14/2016.     1. Gastroesophageal reflux disease, esophagitis presence not specified  Patient managing symptoms with daily ranitidine.  2. Hypothyroidism, unspecified type  Patient takes levothyroxine for management.  Thyroid labs monitored regularly.  3. Recurrent major depressive disorder, in full remission [facility])  Patient takes Lexapro daily.  Patient wondering about possibly increasing dose d/t notable symptoms of just not wanting to do anything and "wanting to crawl into a hole". Depression screen [facility] 2/9 11/14/2016 05/02/2016 09/21/2015 03/23/2015 09/29/2014  Decreased Interest 1 0 1 1 -  Down, Depressed, Hopeless 1 0 0 0 0  PHQ - 2 Score 2 0 1 1 0  Altered sleeping 1 - - - -  Tired, decreased energy 2 - - - -  Change in appetite 2 - - - -  Feeling bad or failure about yourself  1 - - - -  Trouble concentrating 0 - - - -  Moving slowly or fidgety/restless 0 - - - -  Suicidal  thoughts 0 - - - -  PHQ-9 Score 8 - - - -     4. Severe obesity (BMI >= 40) (HCC)  No significant weight gain or loss.    New complaints: -Patient states she is increasingly gassy and notes that after eating she will get a crampy feeling in her stomach.  She states she has both diarrhea and constipation intermittently.   - dysuria for 2 days. No fever. Social history:    Review of Systems  Constitutional: Negative for activity change and appetite change.  Respiratory: Negative for cough and shortness of breath.   Gastrointestinal: Positive for constipation and diarrhea.  Neurological: Negative for dizziness, light-headedness and headaches.  All other systems reviewed and are negative.      Objective:   Physical Exam  Constitutional: She is oriented to person, place, and time. She appears well-developed and well-nourished. No distress.  HENT:  Head: Normocephalic.  Right Ear: External ear normal.  Left Ear: External ear normal.  Mouth/Throat: Oropharynx is clear and moist.  Eyes: Pupils are equal, round, and reactive to light.  Neck: Normal range of motion. Neck supple. No thyromegaly present.  Cardiovascular: Normal rate, regular rhythm, normal heart sounds and intact distal pulses.   No murmur heard. Pulmonary/Chest: Effort normal and breath sounds normal. No respiratory distress. She has no wheezes.  Abdominal: Soft. Bowel sounds are  normal. She exhibits no distension. There is no tenderness.  Musculoskeletal: She exhibits edema (nonpitting bilateral lower extremity ).  Lymphadenopathy:    She has no cervical adenopathy.  Neurological: She is alert and oriented to person, place, and time.  Skin: Skin is warm and dry.  Psychiatric: She has a normal mood and affect. Her behavior is normal.   BP 113/72   Pulse 77   Temp (!) 97.2 F (36.2 C) (Oral)   Ht '5\' 4"'$  (1.626 m)   Wt 272 lb (123.4 kg)   LMP  (LMP Unknown)   BMI 46.69 kg/m      Urine clear KUB- moderate  amount of stool in proximal colon-Preliminary reading by Ronnald Collum, FNP  [facility]   Assessment & Plan:  1. Gastroesophageal reflux disease, esophagitis presence not specified Avoid spicy foods Do not eat 2 hours prior to bedtime - ranitidine (ZANTAC) 150 MG capsule; Take 1 capsule (150 mg total) by mouth 2 (two) times daily.  Dispense: 60 capsule; Refill: 5   2. Hypothyroidism, unspecified type - CMP14+EGFR - Lipid panel - Thyroid Panel With TSH  3. . Recurrent major depressive disorder, in partial remission (HCC) Stress management - buPROPion (WELLBUTRIN XL) 150 MG 24 hr tablet; Take 1 tablet (150 mg total) by mouth daily.  Dispense: 30 tablet; Refill: 5- escitalopram (LEXAPRO) 20 MG tablet; Take 1 tablet (20 mg total) by mouth daily.  Dispense: 30 tablet; Refill: 5 - clonazePAM (KLONOPIN) 0.5 MG tablet; Take 1 tablet (0.5 mg total) by mouth 2 (two) times daily as needed.  Dispense: 60 tablet; Refill: 2  4. Severe obesity (BMI >= 40) (HCC) Discussed diet and exercise for person with BMI >25 Will recheck weight in 3-6 months  5. Alternating constipation and diarrhea miralax daily Probiotic may help also - DG Abd 1 View; Future  6. Dysuria Increase water - Urinalysis, Complete  7. Acquired hypothyroidism - levothyroxine (SYNTHROID, LEVOTHROID) 75 MCG tablet; Take 1 tablet (75 mcg total) by mouth daily before breakfast.  Dispense: 30 tablet; Refill: 5      Labs pending Health maintenance reviewed Diet and exercise encouraged Continue all meds Follow up  In 6 months   [place], FNP

## 2016-11-14 NOTE — Patient Instructions (Signed)
Constipation, Adult °Constipation is when a person: °· Poops (has a bowel movement) fewer times in a week than normal. °· Has a hard time pooping. °· Has poop that is dry, hard, or bigger than normal. ° °Follow these instructions at home: °Eating and drinking ° °· Eat foods that have a lot of fiber, such as: °? Fresh fruits and vegetables. °? Whole grains. °? Beans. °· Eat less of foods that are high in fat, low in fiber, or overly processed, such as: °? French fries. °? Hamburgers. °? Cookies. °? Candy. °? Soda. °· Drink enough fluid to keep your pee (urine) clear or pale yellow. °General instructions °· Exercise regularly or as told by your doctor. °· Go to the restroom when you feel like you need to poop. Do not hold it in. °· Take over-the-counter and prescription medicines only as told by your doctor. These include any fiber supplements. °· Do pelvic floor retraining exercises, such as: °? Doing deep breathing while relaxing your lower belly (abdomen). °? Relaxing your pelvic floor while pooping. °· Watch your condition for any changes. °· Keep all follow-up visits as told by your doctor. This is important. °Contact a doctor if: °· You have pain that gets worse. °· You have a fever. °· You have not pooped for 4 days. °· You throw up (vomit). °· You are not hungry. °· You lose weight. °· You are bleeding from the anus. °· You have thin, pencil-like poop (stool). °Get help right away if: °· You have a fever, and your symptoms suddenly get worse. °· You leak poop or have blood in your poop. °· Your belly feels hard or bigger than normal (is bloated). °· You have very bad belly pain. °· You feel dizzy or you faint. °This information is not intended to replace advice given to you by your health care provider. Make sure you discuss any questions you have with your health care provider. °Document Released: 08/09/2007 Document Revised: 09/10/2015 Document Reviewed: 08/11/2015 °Elsevier Interactive Patient Education ©  2017 [employer] Inc. ° °

## 2016-11-15 ENCOUNTER — Telehealth: Payer: Self-pay | Admitting: Nurse Practitioner

## 2016-11-15 LAB — URINALYSIS, COMPLETE

## 2016-11-15 LAB — THYROID PANEL WITH TSH
FREE THYROXINE INDEX: 2.6 (ref 1.2–4.9)
T3 UPTAKE RATIO: 26 % (ref 24–39)
T4, Total: 10 ug/dL (ref 4.5–12.0)
TSH: 4.83 u[IU]/mL — AB (ref 0.450–4.500)

## 2016-11-15 LAB — LIPID PANEL
CHOL/HDL RATIO: 3.1 ratio (ref 0.0–4.4)
Cholesterol, Total: 217 mg/dL — ABNORMAL HIGH (ref 100–199)
HDL: 69 mg/dL (ref >39)
LDL CALC: 130 mg/dL — AB (ref 0–99)
Triglycerides: 90 mg/dL (ref 0–149)
VLDL CHOLESTEROL CAL: 18 mg/dL (ref 5–40)

## 2016-11-15 LAB — CMP14+EGFR
ALBUMIN: 4.4 g/dL (ref 3.6–4.8)
ALT: 25 IU/L (ref 0–32)
AST: 28 IU/L (ref 0–40)
Albumin/Globulin Ratio: 1.8 (ref 1.2–2.2)
Alkaline Phosphatase: 104 IU/L (ref 39–117)
BUN / CREAT RATIO: 15 (ref 12–28)
BUN: 13 mg/dL (ref 8–27)
Bilirubin Total: 0.5 mg/dL (ref 0.0–1.2)
CALCIUM: 9.3 mg/dL (ref 8.7–10.3)
CHLORIDE: 102 mmol/L (ref 96–106)
CO2: 23 mmol/L (ref 20–29)
CREATININE: 0.84 mg/dL (ref 0.57–1.00)
GFR, EST AFRICAN AMERICAN: 87 mL/min/{1.73_m2} (ref >59)
GFR, EST NON AFRICAN AMERICAN: 76 mL/min/{1.73_m2} (ref >59)
Globulin, Total: 2.4 g/dL (ref 1.5–4.5)
Glucose: 106 mg/dL — ABNORMAL HIGH (ref 65–99)
Potassium: 4.2 mmol/L (ref 3.5–5.2)
Sodium: 144 mmol/L (ref 134–144)
TOTAL PROTEIN: 6.8 g/dL (ref 6.0–8.5)

## 2016-11-15 NOTE — Telephone Encounter (Signed)
Left message for patient to call earlier.

## 2016-12-11 ENCOUNTER — Ambulatory Visit (INDEPENDENT_AMBULATORY_CARE_PROVIDER_SITE_OTHER): Payer: PRIVATE HEALTH INSURANCE | Admitting: Family Medicine

## 2016-12-11 ENCOUNTER — Encounter: Payer: Self-pay | Admitting: Family Medicine

## 2016-12-11 VITALS — BP 119/79 | HR 86 | Temp 97.9°F | Ht 64.0 in | Wt 267.0 lb

## 2016-12-11 DIAGNOSIS — K5900 Constipation, unspecified: Secondary | ICD-10-CM

## 2016-12-11 NOTE — Progress Notes (Signed)
BP 119/79   Pulse 86   Temp 97.9 F (36.6 C) (Oral)   Ht 5\' 4"  (1.626 m)   Wt 267 lb (121.1 kg)   LMP  (LMP Unknown)   BMI 45.83 kg/m    Subjective:    Patient ID: Erin Hale, female    DOB: 07/19/1955, 61 y.o.   MRN: [id number]  HPI: Erin Hale is a 61 y.o. female presenting on 12/11/2016 for mild abdominal cramping and bloating that has been better since her appointment on 11/14/16. Since that time she has been taking metamucil once per day and eating more fiber with relief of the cramping lower abdominal pain which was diagnosed as constipation after an X-ray showed that she was full of stool. She reports that she was out of work with abdominal pain last Thursday, Friday, and Saturday and is requesting a work note. She made the appointment at the end of last week because she was having more pain than but now she has almost no pain today and is feeling much better  HPI   Relevant past medical, surgical, family and social history reviewed and updated as indicated. Interim medical history since our last visit reviewed. Allergies and medications reviewed and updated.  Review of Systems  Constitutional: Negative for chills and fever.  HENT: Negative for congestion, rhinorrhea, sinus pain, sinus pressure, sneezing and sore throat.   Respiratory: Negative for cough, shortness of breath and wheezing.   Cardiovascular: Negative for chest pain, palpitations and leg swelling.  Gastrointestinal: Positive for abdominal pain and constipation. Negative for abdominal distention, diarrhea, nausea and vomiting.  Genitourinary: Negative for dysuria, frequency and hematuria.  Musculoskeletal: Negative for arthralgias, back pain, joint swelling and myalgias.  Skin: Negative for color change, pallor, rash and wound.  Neurological: Negative for dizziness, weakness and headaches.  Psychiatric/Behavioral: Negative for agitation, behavioral problems and confusion.    Per HPI unless specifically  indicated above        Objective:    BP 119/79   Pulse 86   Temp 97.9 F (36.6 C) (Oral)   Ht 5\' 4"  (1.626 m)   Wt 267 lb (121.1 kg)   LMP  (LMP Unknown)   BMI 45.83 kg/m   Wt Readings from Last 3 Encounters:  12/11/16 267 lb (121.1 kg)  11/14/16 272 lb (123.4 kg)  09/21/15 274 lb (124.3 kg)    Physical Exam  Constitutional: She is oriented to person, place, and time. She appears well-developed and well-nourished. No distress.  HENT:  Head: Normocephalic and atraumatic.  Mouth/Throat: Oropharynx is clear and moist. No oropharyngeal exudate.  Eyes: Pupils are equal, round, and reactive to light. Conjunctivae and EOM are normal.  Neck: Normal range of motion. Neck supple. No thyromegaly present.  Cardiovascular: Normal rate, regular rhythm and normal heart sounds.   No murmur heard. Pulmonary/Chest: Effort normal and breath sounds normal. No respiratory distress. She has no wheezes. She has no rales. She exhibits no tenderness.  Abdominal: Soft. Bowel sounds are normal. She exhibits no distension and no mass. There is no tenderness. There is no rebound and no guarding.  Musculoskeletal: Normal range of motion. She exhibits no edema.  Neurological: She is alert and oriented to person, place, and time. She has normal reflexes.  Skin: Skin is warm and dry. No rash noted. She is not diaphoretic. No erythema. No pallor.  Psychiatric: She has a normal mood and affect. Her behavior is normal. Judgment and thought content normal.  Assessment & Plan:   Problem List Items Addressed This Visit    None    Visit Diagnoses    Constipation, unspecified constipation type    -  Primary       Erin Hale is a 61 y.o. female presenting on 12/11/2016 for mild abdominal cramping and bloating that has been better since her appointment on 11/14/16. Her X-ray at that time showed stool burden and these symptoms are 2/2 constipation. She had no focal findings on exam. She has been doing  better on the once per day metamucil, and I encouraged her to use it two times per day on weeks when she feels more bloated. She is also agreeable to increasing her fiber even more and attempting to start an exercise regimine walking around a park in her neighborhood. I wrote her off work for the days she missed last week, and encouraged her to follow up at her December appointment or sooner if her symptoms acutely worsen.    Follow up plan: Return if symptoms worsen or fail to improve.  Patient seen and examined with Brock Bad medical student. Agree with assessment and plan above.   Caryl Pina, MD [facility] 12/11/2016, 9:37 AM

## 2017-02-20 ENCOUNTER — Ambulatory Visit: Payer: PRIVATE HEALTH INSURANCE | Admitting: Nurse Practitioner

## 2017-03-15 ENCOUNTER — Other Ambulatory Visit: Payer: Self-pay | Admitting: Nurse Practitioner

## 2017-03-15 DIAGNOSIS — F3341 Major depressive disorder, recurrent, in partial remission: Secondary | ICD-10-CM

## 2017-03-20 ENCOUNTER — Ambulatory Visit (INDEPENDENT_AMBULATORY_CARE_PROVIDER_SITE_OTHER): Payer: PRIVATE HEALTH INSURANCE | Admitting: Nurse Practitioner

## 2017-03-20 ENCOUNTER — Encounter: Payer: Self-pay | Admitting: Nurse Practitioner

## 2017-03-20 VITALS — BP 112/69 | HR 80 | Temp 97.3°F | Ht 64.0 in | Wt 267.0 lb

## 2017-03-20 DIAGNOSIS — F3342 Major depressive disorder, recurrent, in full remission: Secondary | ICD-10-CM

## 2017-03-20 DIAGNOSIS — F3341 Major depressive disorder, recurrent, in partial remission: Secondary | ICD-10-CM | POA: Diagnosis not present

## 2017-03-20 DIAGNOSIS — E039 Hypothyroidism, unspecified: Secondary | ICD-10-CM

## 2017-03-20 DIAGNOSIS — R739 Hyperglycemia, unspecified: Secondary | ICD-10-CM

## 2017-03-20 DIAGNOSIS — K219 Gastro-esophageal reflux disease without esophagitis: Secondary | ICD-10-CM

## 2017-03-20 MED ORDER — CLONAZEPAM 0.5 MG PO TABS: 0.5000 mg | ORAL_TABLET | Freq: Two times a day (BID) | ORAL | 2 refills | Status: DC | PRN

## 2017-03-20 MED ORDER — RANITIDINE HCL 150 MG PO CAPS
150.0000 mg | ORAL_CAPSULE | Freq: Two times a day (BID) | ORAL | 5 refills | Status: DC
Start: 2017-03-20 — End: 2018-02-11

## 2017-03-20 MED ORDER — LEVOTHYROXINE SODIUM 75 MCG PO TABS: 75.0000 ug | ORAL_TABLET | Freq: Every day | ORAL | 5 refills | Status: DC

## 2017-03-20 MED ORDER — BUPROPION HCL ER (XL) 150 MG PO TB24: 150.0000 mg | ORAL_TABLET | Freq: Every day | ORAL | 5 refills | Status: DC

## 2017-03-20 MED ORDER — ESCITALOPRAM OXALATE 20 MG PO TABS: 20.0000 mg | ORAL_TABLET | Freq: Every day | ORAL | 5 refills | Status: DC

## 2017-03-20 NOTE — Progress Notes (Signed)
Subjective:    Patient ID: Erin Hale, female    DOB: [birth year], 62 y.o.   MRN: [id number]  HPI  Erin Hale is here today for follow up of chronic medical problem.  Outpatient Encounter Medications as of 03/20/2017  Medication Sig  . acetaminophen (TYLENOL) 650 MG CR tablet Take 650 mg by mouth every 8 (eight) hours as needed for pain.  Marland Kitchen buPROPion (WELLBUTRIN XL) 150 MG 24 hr tablet Take 1 tablet (150 mg total) by mouth daily. (Patient not taking: Reported on 12/11/2016)  . clonazePAM (KLONOPIN) 0.5 MG tablet TAKE 1 TABLET BY MOUTH TWICE DAILY AS NEEDED  . diphenhydrAMINE (BENADRYL) 25 mg capsule Take 25 mg by mouth every 6 (six) hours as needed.  Marland Kitchen escitalopram (LEXAPRO) 20 MG tablet Take 1 tablet (20 mg total) by mouth daily.  Marland Kitchen ibuprofen (ADVIL,MOTRIN) 200 MG tablet Take 200 mg by mouth every 6 (six) hours as needed for pain.  Marland Kitchen levothyroxine (SYNTHROID, LEVOTHROID) 75 MCG tablet Take 1 tablet (75 mcg total) by mouth daily before breakfast.  . Melatonin 5 MG CAPS Take by mouth.  . ranitidine (ZANTAC) 150 MG capsule Take 1 capsule (150 mg total) by mouth 2 (two) times daily.     1. Gastroesophageal reflux disease, esophagitis presence not specified  Says that she takes zantac on an as needed basis,which lately it has been often  2. Hypothyroidism, unspecified type  No problems that she is aware of.  3. Recurrent major depressive disorder, in full remission [facility])  She is currently on lexapro and wellbutrin. The combination is working well for her .  Depression screen [facility] 2/9 03/20/2017 12/11/2016 11/14/2016  Decreased Interest _0 Down, Depressed, Hopeless _1 PHQ - 2 Score _2 Altered sleeping _3 Tired, decreased energy _4 Change in appetite _5 Feeling bad or failure about yourself  _6 Trouble concentrating 0 0 0  Moving slowly or fidgety/restless 0 0 0  Suicidal thoughts 0 0 0  PHQ-9 Score _7 Difficult doing work/chores - Somewhat  difficult -  * patient says she is sick and that is why she scored a little higher this time.   4. Severe obesity (BMI >= 40) (HCC)  No weight changes to speak of    New complaints: Had cough last week but is getting better.  Social history: works at [employer] in morning. Lives with her sister.    Review of Systems  Constitutional: Negative for activity change and appetite change.  HENT: Negative.   Eyes: Negative for pain.  Respiratory: Negative for shortness of breath.   Cardiovascular: Negative for chest pain, palpitations and leg swelling.  Gastrointestinal: Negative for abdominal pain.  Endocrine: Negative for polydipsia.  Genitourinary: Negative.   Skin: Negative for rash.  Neurological: Negative for dizziness, weakness and headaches.  Hematological: Does not bruise/bleed easily.  Psychiatric/Behavioral: Negative.   All other systems reviewed and are negative.      Objective:   Physical Exam  Constitutional: She is oriented to person, place, and time. She appears well-developed and well-nourished.  HENT:  Nose: Nose normal.  Mouth/Throat: Oropharynx is clear and moist.  Eyes: EOM are normal.  Neck: Trachea normal, normal range of motion and full passive range of motion without pain. Neck supple. No JVD present. Carotid bruit is not present. No thyromegaly present.  Cardiovascular: Normal rate, regular rhythm, normal heart  sounds and intact distal pulses. Exam reveals no gallop and no friction rub.  No murmur heard. Pulmonary/Chest: Effort normal and breath sounds normal.  Abdominal: Soft. Bowel sounds are normal. She exhibits no distension and no mass. There is no tenderness.  Musculoskeletal: Normal range of motion. She exhibits edema (1+ edema bil lower ext).  Lymphadenopathy:    She has no cervical adenopathy.  Neurological: She is alert and oriented to person, place, and time. She has normal reflexes.  Skin: Skin is warm and dry.  Psychiatric: She has a  normal mood and affect. Her behavior is normal. Judgment and thought content normal.    BP 112/69   Pulse 80   Temp (!) 97.3 F (36.3 C) (Oral)   Ht _0  (1.626 m)   Wt 267 lb (121.1 kg)   LMP  (LMP Unknown)   BMI 45.83 kg/m         Assessment & Plan:  1. Gastroesophageal reflux disease, esophagitis presence not specified Avoid spicy foods Do not eat 2 hours prior to bedtime  - ranitidine (ZANTAC) 150 MG capsule; Take 1 capsule (150 mg total) by mouth 2 (two) times daily.  Dispense: 60 capsule; Refill: 5  2. Hypothyroidism, unspecified type - levothyroxine (SYNTHROID, LEVOTHROID) 75 MCG tablet; Take 1 tablet (75 mcg total) by mouth daily before breakfast.  Dispense: 30 tablet; Refill: 5 - CMP14+EGFR - Lipid panel - Thyroid Panel With TSH  3. Recurrent major depressive disorder, in full remission ([place]) Stress management - buPROPion (WELLBUTRIN XL) 150 MG 24 hr tablet; Take 1 tablet (150 mg total) by mouth daily.  Dispense: 30 tablet; Refill: 5  4. Severe obesity (BMI >= 40) (HCC) Discussed diet and exercise for person with BMI >25 Will recheck weight in 3-6 months   5. Recurrent major depressive disorder, in partial remission (HCC)  - escitalopram (LEXAPRO) 20 MG tablet; Take 1 tablet (20 mg total) by mouth daily.  Dispense: 30 tablet; Refill: 5 - clonazePAM (KLONOPIN) 0.5 MG tablet; Take 1 tablet (0.5 mg total) by mouth 2 (two) times daily as needed.  Dispense: 60 tablet; Refill: 2    Labs pending Health maintenance reviewed Diet and exercise encouraged Continue all meds Follow up  In 6 months   [place], FNP

## 2017-03-20 NOTE — Patient Instructions (Signed)
Stress and Stress Management Stress is a normal reaction to life events. It is what you feel when life demands more than you are used to or more than you can handle. Some stress can be useful. For example, the stress reaction can help you catch the last bus of the day, study for a test, or meet a deadline at work. But stress that occurs too often or for too long can cause problems. It can affect your emotional health and interfere with relationships and normal daily activities. Too much stress can weaken your immune system and increase your risk for physical illness. If you already have a medical problem, stress can make it worse. What are the causes? All sorts of life events may cause stress. An event that causes stress for one person may not be stressful for another person. Major life events commonly cause stress. These may be positive or negative. Examples include losing your job, moving into a new home, getting married, having a baby, or losing a loved one. Less obvious life events may also cause stress, especially if they occur day after day or in combination. Examples include working long hours, driving in traffic, caring for children, being in debt, or being in a difficult relationship. What are the signs or symptoms? Stress may cause emotional symptoms including, the following:  Anxiety. This is feeling worried, afraid, on edge, overwhelmed, or out of control.  Anger. This is feeling irritated or impatient.  Depression. This is feeling sad, down, helpless, or guilty.  Difficulty focusing, remembering, or making decisions.  Stress may cause physical symptoms, including the following:  Aches and pains. These may affect your head, neck, back, stomach, or other areas of your body.  Tight muscles or clenched jaw.  Low energy or trouble sleeping.  Stress may cause unhealthy behaviors, including the following:  Eating to feel better (overeating) or skipping meals.  Sleeping too little,  too much, or both.  Working too much or putting off tasks (procrastination).  Smoking, drinking alcohol, or using drugs to feel better.  How is this diagnosed? Stress is diagnosed through an assessment by your health care provider. Your health care provider will ask questions about your symptoms and any stressful life events.Your health care provider will also ask about your medical history and may order blood tests or other tests. Certain medical conditions and medicine can cause physical symptoms similar to stress. Mental illness can cause emotional symptoms and unhealthy behaviors similar to stress. Your health care provider may refer you to a mental health professional for further evaluation. How is this treated? Stress management is the recommended treatment for stress.The goals of stress management are reducing stressful life events and coping with stress in healthy ways. Techniques for reducing stressful life events include the following:  Stress identification. Self-monitor for stress and identify what causes stress for you. These skills may help you to avoid some stressful events.  Time management. Set your priorities, keep a calendar of events, and learn to say "no." These tools can help you avoid making too many commitments.  Techniques for coping with stress include the following:  Rethinking the problem. Try to think realistically about stressful events rather than ignoring them or overreacting. Try to find the positives in a stressful situation rather than focusing on the negatives.  Exercise. Physical exercise can release both physical and emotional tension. The key is to find a form of exercise you enjoy and do it regularly.  Relaxation techniques. These relax the body and  mind. Examples include yoga, meditation, tai chi, biofeedback, deep breathing, progressive muscle relaxation, listening to music, being out in nature, journaling, and other hobbies. Again, the key is to find  one or more that you enjoy and can do regularly.  Healthy lifestyle. Eat a balanced diet, get plenty of sleep, and do not smoke. Avoid using alcohol or drugs to relax.  Strong support network. Spend time with family, friends, or other people you enjoy being around.Express your feelings and talk things over with someone you trust.  Counseling or talktherapy with a mental health professional may be helpful if you are having difficulty managing stress on your own. Medicine is typically not recommended for the treatment of stress.Talk to your health care provider if you think you need medicine for symptoms of stress. Follow these instructions at home:  Keep all follow-up visits as directed by your health care provider.  Take all medicines as directed by your health care provider. Contact a health care provider if:  Your symptoms get worse or you start having new symptoms.  You feel overwhelmed by your problems and can no longer manage them on your own. Get help right away if:  You feel like hurting yourself or someone else. This information is not intended to replace advice given to you by your health care provider. Make sure you discuss any questions you have with your health care provider. Document Released: 08/16/2000 Document Revised: 07/29/2015 Document Reviewed: 10/15/2012 Elsevier Interactive Patient Education  2017 [employer]  

## 2017-03-21 LAB — CMP14+EGFR
ALBUMIN: 3.9 g/dL (ref 3.6–4.8)
ALK PHOS: 103 IU/L (ref 39–117)
ALT: 19 IU/L (ref 0–32)
AST: 23 IU/L (ref 0–40)
Albumin/Globulin Ratio: 1.5 (ref 1.2–2.2)
BUN / CREAT RATIO: 12 (ref 12–28)
BUN: 11 mg/dL (ref 8–27)
Bilirubin Total: 0.3 mg/dL (ref 0.0–1.2)
CALCIUM: 9 mg/dL (ref 8.7–10.3)
CO2: 22 mmol/L (ref 20–29)
CREATININE: 0.92 mg/dL (ref 0.57–1.00)
Chloride: 107 mmol/L — ABNORMAL HIGH (ref 96–106)
GFR, EST AFRICAN AMERICAN: 78 mL/min/{1.73_m2} (ref >59)
GFR, EST NON AFRICAN AMERICAN: 68 mL/min/{1.73_m2} (ref >59)
GLOBULIN, TOTAL: 2.6 g/dL (ref 1.5–4.5)
Glucose: 114 mg/dL — ABNORMAL HIGH (ref 65–99)
Potassium: 3.9 mmol/L (ref 3.5–5.2)
SODIUM: 143 mmol/L (ref 134–144)
TOTAL PROTEIN: 6.5 g/dL (ref 6.0–8.5)

## 2017-03-21 LAB — LIPID PANEL
CHOL/HDL RATIO: 3.5 ratio (ref 0.0–4.4)
Cholesterol, Total: 223 mg/dL — ABNORMAL HIGH (ref 100–199)
HDL: 64 mg/dL (ref >39)
LDL CALC: 139 mg/dL — AB (ref 0–99)
Triglycerides: 100 mg/dL (ref 0–149)
VLDL CHOLESTEROL CAL: 20 mg/dL (ref 5–40)

## 2017-03-21 LAB — THYROID PANEL WITH TSH
Free Thyroxine Index: 2.4 (ref 1.2–4.9)
T3 UPTAKE RATIO: 30 % (ref 24–39)
T4 TOTAL: 8.1 ug/dL (ref 4.5–12.0)
TSH: 4.07 u[IU]/mL (ref 0.450–4.500)

## 2017-03-22 ENCOUNTER — Other Ambulatory Visit: Payer: Self-pay | Admitting: Nurse Practitioner

## 2017-03-22 DIAGNOSIS — R739 Hyperglycemia, unspecified: Secondary | ICD-10-CM

## 2017-03-26 ENCOUNTER — Encounter: Payer: Self-pay | Admitting: *Deleted

## 2017-03-26 LAB — BAYER DCA HB A1C WAIVED: HB A1C (BAYER DCA - WAIVED): 5.3 % (ref <7.0)

## 2017-03-26 NOTE — Addendum Note (Signed)
Addended by: Liliane Bade on: 03/26/2017 09:48 AM   Modules accepted: Orders

## 2017-03-27 ENCOUNTER — Ambulatory Visit: Payer: PRIVATE HEALTH INSURANCE | Admitting: Nurse Practitioner

## 2017-04-24 ENCOUNTER — Ambulatory Visit (INDEPENDENT_AMBULATORY_CARE_PROVIDER_SITE_OTHER): Payer: PRIVATE HEALTH INSURANCE | Admitting: Nurse Practitioner

## 2017-04-24 ENCOUNTER — Encounter: Payer: Self-pay | Admitting: Nurse Practitioner

## 2017-04-24 VITALS — BP 126/79 | HR 91 | Temp 96.8°F | Ht 64.0 in | Wt 259.0 lb

## 2017-04-24 DIAGNOSIS — R0602 Shortness of breath: Secondary | ICD-10-CM

## 2017-04-24 DIAGNOSIS — J410 Simple chronic bronchitis: Secondary | ICD-10-CM

## 2017-04-24 MED ORDER — LEVALBUTEROL HCL 1.25 MG/3ML IN NEBU
1.2500 mg | INHALATION_SOLUTION | RESPIRATORY_TRACT | Status: AC
  Administered 2017-04-24: 1.25 mg via RESPIRATORY_TRACT

## 2017-04-24 MED ORDER — BUDESONIDE-FORMOTEROL FUMARATE 80-4.5 MCG/ACT IN AERO: 2.0000 | INHALATION_SPRAY | Freq: Two times a day (BID) | RESPIRATORY_TRACT | 3 refills | Status: DC

## 2017-04-24 MED ORDER — ALBUTEROL SULFATE HFA 108 (90 BASE) MCG/ACT IN AERS: 2.0000 | INHALATION_SPRAY | Freq: Four times a day (QID) | RESPIRATORY_TRACT | 0 refills | Status: DC | PRN

## 2017-04-24 NOTE — Progress Notes (Signed)
   Subjective:    Patient ID: Erin Hale, female    DOB: April 12, 1955, 62 y.o.   MRN: [id number]  HPI Patient comes in today c/o breathing problems. She has had this in the past and was on inhaler. She stopped using because she felt okay . But the last several years her breathing has gotten worse. She works at [employer] and it is very hot and that makes her breathing even worse.she was a smoker for 25 years or more. She quit smoking in 1996.    Review of Systems  Constitutional: Negative for activity change and appetite change.  HENT: Negative.   Eyes: Negative for pain.  Respiratory: Positive for cough and shortness of breath.   Cardiovascular: Negative for chest pain, palpitations and leg swelling.  Gastrointestinal: Negative for abdominal pain.  Endocrine: Negative for polydipsia.  Genitourinary: Negative.   Skin: Negative for rash.  Neurological: Negative for dizziness, weakness and headaches.  Hematological: Does not bruise/bleed easily.  Psychiatric/Behavioral: Negative.   All other systems reviewed and are negative.      Objective:   Physical Exam  Constitutional: She appears well-developed and well-nourished. No distress.  Cardiovascular: Normal rate and regular rhythm.  Pulmonary/Chest: Effort normal.    Pre spirometry FVC85% FEV1 90% and fev1/fvc 104% Post spirometry FVC 84% FEV1 94% and fev1/fvc 111%  * spiromerty showed slight inprovement after breathing treatment    Assessment & Plan:  1. SOB (shortness of breath) - PR EVAL OF BRONCHOSPASM - levalbuterol (XOPENEX) nebulizer solution 1.25 mg  2. Simple chronic bronchitis (HCC) Force fluids Avoid cigarette smoke Follow up in 3 months - budesonide-formoterol (SYMBICORT) 80-4.5 MCG/ACT inhaler; Inhale 2 puffs into the lungs 2 (two) times daily.  Dispense: 1 Inhaler; Refill: 3 - albuterol (PROVENTIL HFA;VENTOLIN HFA) 108 (90 Base) MCG/ACT inhaler; Inhale 2 puffs into the lungs every 6 (six) hours as needed for  wheezing or shortness of breath.  Dispense: 1 Inhaler; Refill: 0  Mary-Margaret Hassell Done, FNP

## 2017-04-24 NOTE — Patient Instructions (Signed)

## 2017-04-30 ENCOUNTER — Telehealth: Payer: Self-pay | Admitting: Nurse Practitioner

## 2017-05-14 ENCOUNTER — Ambulatory Visit: Payer: PRIVATE HEALTH INSURANCE | Admitting: Nurse Practitioner

## 2017-06-19 ENCOUNTER — Ambulatory Visit: Payer: PRIVATE HEALTH INSURANCE | Admitting: Nurse Practitioner

## 2017-06-20 ENCOUNTER — Encounter: Payer: Self-pay | Admitting: Family Medicine

## 2017-06-20 ENCOUNTER — Ambulatory Visit (INDEPENDENT_AMBULATORY_CARE_PROVIDER_SITE_OTHER): Payer: PRIVATE HEALTH INSURANCE | Admitting: Family Medicine

## 2017-06-20 VITALS — BP 139/90 | HR 99 | Temp 98.6°F | Ht 64.0 in | Wt 261.0 lb

## 2017-06-20 DIAGNOSIS — M5441 Lumbago with sciatica, right side: Secondary | ICD-10-CM | POA: Diagnosis not present

## 2017-06-20 MED ORDER — METHYLPREDNISOLONE ACETATE 80 MG/ML IJ SUSP
80.0000 mg | Freq: Once | INTRAMUSCULAR | Status: AC
Start: 2017-06-20 — End: 2017-06-20
  Administered 2017-06-20: 80 mg via INTRAMUSCULAR

## 2017-06-20 MED ORDER — TIZANIDINE HCL 4 MG PO TABS
2.0000 mg | ORAL_TABLET | Freq: Three times a day (TID) | ORAL | 0 refills | Status: DC | PRN
Start: 2017-06-20 — End: 2017-09-17

## 2017-06-20 MED ORDER — MELOXICAM 15 MG PO TABS: 15.0000 mg | ORAL_TABLET | Freq: Every day | ORAL | 0 refills | Status: DC

## 2017-06-20 NOTE — Progress Notes (Signed)
Subjective: CC: Right-sided low back pain PCP: Chevis Pretty, FNP BHA:LPFXT E Vanderwerf is a 62 y.o. female presenting to clinic today for:  1.  Right-sided low back pain Patient notes that she has had about a 2-week history of right-sided low back pain that radiates from the right low back around her right hip.  Does not involve the groin.  She notes that sometimes pain is so severe that she becomes nauseated from it.  There is no specific movement that exacerbates symptoms.  She reports that pain seems to come at random.  She feels like it is a muscle spasm.  Denies any urinary retention, fecal incontinence, lower extremity weakness or falls.  No preceding injury.  She does feel that her right foot seems tingly sometimes though.  She has been using 600 mg of ibuprofen every 4-6 hours as needed.  She notes that she is not able to take this as often as she would like because she does not eat frequently enough to take the medication.  Pain is currently a dull pain because she is sitting.  Pain at its worst is a 10/10.  She works at [employer] and is frequently standing.   ROS: Per HPI  No Known Allergies Past Medical History:  Diagnosis Date  . Depression   . Panic disorder   . Thyroid disease    hypothyroidism    Current Outpatient Medications:  .  acetaminophen (TYLENOL) 650 MG CR tablet, Take 650 mg by mouth every 8 (eight) hours as needed for pain., Disp: , Rfl:  .  albuterol (PROVENTIL HFA;VENTOLIN HFA) 108 (90 Base) MCG/ACT inhaler, Inhale 2 puffs into the lungs every 6 (six) hours as needed for wheezing or shortness of breath., Disp: 1 Inhaler, Rfl: 0 .  budesonide-formoterol (SYMBICORT) 80-4.5 MCG/ACT inhaler, Inhale 2 puffs into the lungs 2 (two) times daily., Disp: 1 Inhaler, Rfl: 3 .  buPROPion (WELLBUTRIN XL) 150 MG 24 hr tablet, Take 1 tablet (150 mg total) by mouth daily., Disp: 30 tablet, Rfl: 5 .  clonazePAM (KLONOPIN) 0.5 MG tablet, Take 1 tablet (0.5 mg total) by  mouth 2 (two) times daily as needed., Disp: 60 tablet, Rfl: 2 .  diphenhydrAMINE (BENADRYL) 25 mg capsule, Take 25 mg by mouth every 6 (six) hours as needed., Disp: , Rfl:  .  escitalopram (LEXAPRO) 20 MG tablet, Take 1 tablet (20 mg total) by mouth daily., Disp: 30 tablet, Rfl: 5 .  ibuprofen (ADVIL,MOTRIN) 200 MG tablet, Take 200 mg by mouth every 6 (six) hours as needed for pain., Disp: , Rfl:  .  levothyroxine (SYNTHROID, LEVOTHROID) 75 MCG tablet, Take 1 tablet (75 mcg total) by mouth daily before breakfast., Disp: 30 tablet, Rfl: 5 .  Melatonin 5 MG CAPS, Take by mouth., Disp: , Rfl:  .  ranitidine (ZANTAC) 150 MG capsule, Take 1 capsule (150 mg total) by mouth 2 (two) times daily., Disp: 60 capsule, Rfl: 5 Social History   Socioeconomic History  . Marital status: Divorced    Spouse name: Not on file  . Number of children: Not on file  . Years of education: Not on file  . Highest education level: Not on file  Occupational History  . Not on file  Social Needs  . Financial resource strain: Not on file  . Food insecurity:    Worry: Not on file    Inability: Not on file  . Transportation needs:    Medical: Not on file    Non-medical: Not on  file  Tobacco Use  . Smoking status: Former Smoker    Types: Cigarettes    Last attempt to quit: 03/15/1994    Years since quitting: 23.2  . Smokeless tobacco: Never Used  Substance and Sexual Activity  . Alcohol use: No  . Drug use: No  . Sexual activity: Not on file  Lifestyle  . Physical activity:    Days per week: Not on file    Minutes per session: Not on file  . Stress: Not on file  Relationships  . Social connections:    Talks on phone: Not on file    Gets together: Not on file    Attends religious service: Not on file    Active member of club or organization: Not on file    Attends meetings of clubs or organizations: Not on file    Relationship status: Not on file  . Intimate partner violence:    Fear of current or ex  partner: Not on file    Emotionally abused: Not on file    Physically abused: Not on file    Forced sexual activity: Not on file  Other Topics Concern  . Not on file  Social History Narrative  . Not on file   Family History  Problem Relation Age of Onset  . Stroke Mother   . Congestive Heart Failure Mother   . Diabetes Father   . Cancer Father        brain    Objective: Office vital signs reviewed. BP 139/90   Pulse 99   Temp 98.6 F (37 C) (Oral)   Ht 5\' 4"  (1.626 m)   Wt 261 lb (118.4 kg)   LMP  (LMP Unknown)   BMI 44.80 kg/m   Physical Examination:  General: Awake, alert, obese, No acute distress MSK:   Lumbar spine: Patient has limited active range of motion in flexion, extension and rotation of the lumbar spine.  She has flattening of the lumbar curve.  No midline tenderness to palpation.  No significant paraspinal muscle tenderness to palpation.  She has negative straight leg raise bilaterally. Neuro: Light touch sensation grossly intact. 5/5 lower extremity strength.  Assessment/ Plan: 62 y.o. female   1. Acute right-sided low back pain with right-sided sciatica I reviewed patient's last abdominal x-ray, which did do note degenerative changes within the lumbar spine.  I suspect that this is probably related to that.  Obesity most likely contributing as well.  She was given a dose of Depo-Medrol here in office.  I prescribed her meloxicam to replace the oral NSAID she is been taking over-the-counter.  Instructions for use discussed with the patient.  I have also given her a prescription for tizanidine to take up to 3 times daily as needed for muscle spasm.  I did caution sedation.  Stretching recommended.  If symptoms are not improving with oral therapies, would consider physical therapy +/-orthopedic referral for further evaluation and management.  Return precautions discussed.  Reasons for emergent evaluation emergency department reviewed.  Patient was good  understanding of follow-up as needed. - methylPREDNISolone acetate (DEPO-MEDROL) injection 80 mg  Meds ordered this encounter  Medications  . methylPREDNISolone acetate (DEPO-MEDROL) injection 80 mg  . meloxicam (MOBIC) 15 MG tablet    Sig: Take 1 tablet (15 mg total) by mouth daily. (as needed for low back pain)    Dispense:  15 tablet    Refill:  0  . tiZANidine (ZANAFLEX) 4 MG tablet    Sig:  Take 0.5-1 tablets (2-4 mg total) by mouth every 8 (eight) hours as needed for muscle spasms.    Dispense:  30 tablet    Refill:  [place], DO [place] [phone]

## 2017-06-20 NOTE — Patient Instructions (Signed)
You were given a dose of depomedrol today to help with pain and inflammation.  I have also prescribed you Meloxicam.  Take this once per day with a full glass of water and a meal.  I prescribed you muscle relaxer.  You may use this up to 3 times a day if needed for muscle spasm.  Be aware that this can cause drowsiness and you should not use it if you need to drive or work.  If your symptoms are not improving with oral medications, we may consider sending you to physical therapy or orthopedics for further evaluation.   Back Pain, Adult Many adults have back pain from time to time. Common causes of back pain include:  A strained muscle or ligament.  Wear and tear (degeneration) of the spinal disks.  Arthritis.  A hit to the back.  Back pain can be short-lived (acute) or last a long time (chronic). A physical exam, lab tests, and imaging studies may be done to find the cause of your pain. Follow these instructions at home: Managing pain and stiffness  Take over-the-counter and prescription medicines only as told by your health care provider.  If directed, apply heat to the affected area as often as told by your health care provider. Use the heat source that your health care provider recommends, such as a moist heat pack or a heating pad. ? Place a towel between your skin and the heat source. ? Leave the heat on for 20-30 minutes. ? Remove the heat if your skin turns bright red. This is especially important if you are unable to feel pain, heat, or cold. You have a greater risk of getting burned.  If directed, apply ice to the injured area: ? Put ice in a plastic bag. ? Place a towel between your skin and the bag. ? Leave the ice on for 20 minutes, 2-3 times a day for the first 2-3 days. Activity  Do not stay in bed. Resting more than 1-2 days can delay your recovery.  Take short walks on even surfaces as soon as you are able. Try to increase the length of time you walk each day.  Do  not sit, drive, or stand in one place for more than 30 minutes at a time. Sitting or standing for long periods of time can put stress on your back.  Use proper lifting techniques. When you bend and lift, use positions that put less stress on your back: ? [place] your knees. ? Keep the load close to your body. ? Avoid twisting.  Exercise regularly as told by your health care provider. Exercising will help your back heal faster. This also helps prevent back injuries by keeping muscles strong and flexible.  Your health care provider may recommend that you see a physical therapist. This person can help you come up with a safe exercise program. Do any exercises as told by your physical therapist. Lifestyle  Maintain a healthy weight. Extra weight puts stress on your back and makes it difficult to have good posture.  Avoid activities or situations that make you feel anxious or stressed. Learn ways to manage anxiety and stress. One way to manage stress is through exercise. Stress and anxiety increase muscle tension and can make back pain worse. General instructions  Sleep on a firm mattress in a comfortable position. Try lying on your side with your knees slightly bent. If you lie on your back, put a pillow under your knees.  Follow your treatment plan  as told by your health care provider. This may include: ? Cognitive or behavioral therapy. ? Acupuncture or massage therapy. ? Meditation or yoga. Contact a health care provider if:  You have pain that is not relieved with rest or medicine.  You have increasing pain going down into your legs or buttocks.  Your pain does not improve in 2 weeks.  You have pain at night.  You lose weight.  You have a fever or chills. Get help right away if:  You develop new bowel or bladder control problems.  You have unusual weakness or numbness in your arms or legs.  You develop nausea or vomiting.  You develop abdominal pain.  You feel  faint. Summary  Many adults have back pain from time to time. A physical exam, lab tests, and imaging studies may be done to find the cause of your pain.  Use proper lifting techniques. When you bend and lift, use positions that put less stress on your back.  Take over-the-counter and prescription medicines and apply heat or ice as directed by your health care provider. This information is not intended to replace advice given to you by your health care provider. Make sure you discuss any questions you have with your health care provider. Document Released: 02/20/2005 Document Revised: 03/27/2016 Document Reviewed: 03/27/2016 Elsevier Interactive Patient Education  [employer].

## 2017-08-06 ENCOUNTER — Telehealth: Payer: Self-pay | Admitting: Nurse Practitioner

## 2017-08-06 ENCOUNTER — Ambulatory Visit (INDEPENDENT_AMBULATORY_CARE_PROVIDER_SITE_OTHER): Payer: PRIVATE HEALTH INSURANCE | Admitting: Family Medicine

## 2017-08-06 ENCOUNTER — Ambulatory Visit (INDEPENDENT_AMBULATORY_CARE_PROVIDER_SITE_OTHER): Payer: PRIVATE HEALTH INSURANCE

## 2017-08-06 ENCOUNTER — Encounter: Payer: Self-pay | Admitting: Family Medicine

## 2017-08-06 VITALS — BP 126/84 | HR 82 | Temp 98.7°F | Ht 64.0 in | Wt 252.0 lb

## 2017-08-06 DIAGNOSIS — R1084 Generalized abdominal pain: Secondary | ICD-10-CM

## 2017-08-06 DIAGNOSIS — K5904 Chronic idiopathic constipation: Secondary | ICD-10-CM | POA: Diagnosis not present

## 2017-08-06 MED ORDER — LINACLOTIDE 72 MCG PO CAPS: 72.0000 ug | ORAL_CAPSULE | Freq: Every day | ORAL | 0 refills | Status: DC

## 2017-08-06 NOTE — Progress Notes (Signed)
Subjective: CC: abdominal pain PCP: Chevis Pretty, FNP GYI:[id number] Erin Hale is a 62 y.o. female presenting to clinic today for:  1. Abdominal pain Patient reports about 8 month history of generalized abdominal pain associated with constipation. She reports that she feels bloated. She notes that she's been having intermittent liquid stools with balls within the stool. Denies any hematochezia, melena. She reports feeling intermittently nauseous but denies any vomiting. She's been tolerating food and drink without difficulty. She reports good intake of fiber and water. She does not exercise regularly. She has tried MiraLAX, Metamucil and to localize with little improvement in symptoms. She notes that the MiraLAX did seem to help initially but no longer helps. She is wondering if there is a daily medication that she can take to regulate stools. Last bowel movement was 2 days ago. She continues to pass flatus. No fevers, chills.   ROS: Per HPI  No Known Allergies Past Medical History:  Diagnosis Date  . Depression   . Panic disorder   . Thyroid disease    hypothyroidism    Current Outpatient Medications:  .  acetaminophen (TYLENOL) 650 MG CR tablet, Take 650 mg by mouth every 8 (eight) hours as needed for pain., Disp: , Rfl:  .  albuterol (PROVENTIL HFA;VENTOLIN HFA) 108 (90 Base) MCG/ACT inhaler, Inhale 2 puffs into the lungs every 6 (six) hours as needed for wheezing or shortness of breath., Disp: 1 Inhaler, Rfl: 0 .  budesonide-formoterol (SYMBICORT) 80-4.5 MCG/ACT inhaler, Inhale 2 puffs into the lungs 2 (two) times daily., Disp: 1 Inhaler, Rfl: 3 .  buPROPion (WELLBUTRIN XL) 150 MG 24 hr tablet, Take 1 tablet (150 mg total) by mouth daily., Disp: 30 tablet, Rfl: 5 .  clonazePAM (KLONOPIN) 0.5 MG tablet, Take 1 tablet (0.5 mg total) by mouth 2 (two) times daily as needed., Disp: 60 tablet, Rfl: 2 .  diphenhydrAMINE (BENADRYL) 25 mg capsule, Take 25 mg by mouth every 6 (six) hours  as needed., Disp: , Rfl:  .  escitalopram (LEXAPRO) 20 MG tablet, Take 1 tablet (20 mg total) by mouth daily., Disp: 30 tablet, Rfl: 5 .  ibuprofen (ADVIL,MOTRIN) 200 MG tablet, Take 200 mg by mouth every 6 (six) hours as needed for pain., Disp: , Rfl:  .  levothyroxine (SYNTHROID, LEVOTHROID) 75 MCG tablet, Take 1 tablet (75 mcg total) by mouth daily before breakfast., Disp: 30 tablet, Rfl: 5 .  Melatonin 5 MG CAPS, Take by mouth., Disp: , Rfl:  .  meloxicam (MOBIC) 15 MG tablet, Take 1 tablet (15 mg total) by mouth daily. (as needed for low back pain), Disp: 15 tablet, Rfl: 0 .  ranitidine (ZANTAC) 150 MG capsule, Take 1 capsule (150 mg total) by mouth 2 (two) times daily., Disp: 60 capsule, Rfl: 5 .  tiZANidine (ZANAFLEX) 4 MG tablet, Take 0.5-1 tablets (2-4 mg total) by mouth every 8 (eight) hours as needed for muscle spasms., Disp: 30 tablet, Rfl: 0 Social History   Socioeconomic History  . Marital status: Divorced    Spouse name: Not on file  . Number of children: Not on file  . Years of education: Not on file  . Highest education level: Not on file  Occupational History  . Not on file  Social Needs  . Financial resource strain: Not on file  . Food insecurity:    Worry: Not on file    Inability: Not on file  . Transportation needs:    Medical: Not on file  Non-medical: Not on file  Tobacco Use  . Smoking status: Former Smoker    Types: Cigarettes    Last attempt to quit: 03/15/1994    Years since quitting: 23.4  . Smokeless tobacco: Never Used  Substance and Sexual Activity  . Alcohol use: No  . Drug use: No  . Sexual activity: Not on file  Lifestyle  . Physical activity:    Days per week: Not on file    Minutes per session: Not on file  . Stress: Not on file  Relationships  . Social connections:    Talks on phone: Not on file    Gets together: Not on file    Attends religious service: Not on file    Active member of club or organization: Not on file    Attends  meetings of clubs or organizations: Not on file    Relationship status: Not on file  . Intimate partner violence:    Fear of current or ex partner: Not on file    Emotionally abused: Not on file    Physically abused: Not on file    Forced sexual activity: Not on file  Other Topics Concern  . Not on file  Social History Narrative  . Not on file   Family History  Problem Relation Age of Onset  . Stroke Mother   . Congestive Heart Failure Mother   . Diabetes Father   . Cancer Father        brain    Objective: Office vital signs reviewed. BP 126/84   Pulse 82   Temp 98.7 F (37.1 C) (Oral)   Ht 5\' 4"  (1.626 m)   Wt 252 lb (114.3 kg)   LMP  (LMP Unknown)   BMI 43.26 kg/m   Physical Examination:  General: Awake, alert, obese, No acute distress HEENT: sclera white, MMM Pulm: normal work of breathing on room air GI: obese, soft, mild generalized TTP, mildly distended, bowel sounds present but hypoactive x4, no hepatomegaly, no splenomegaly, no masses; no peritoneal signs  Dg Abd 1 View  Result Date: 08/06/2017 CLINICAL DATA:  Abdominal pain and constipation EXAM: ABDOMEN - 1 VIEW COMPARISON:  11/14/2016 FINDINGS: Mild to moderate stool volume without obstruction or rectal impaction. No evidence of small bowel obstruction. No concerning mass effect or calcification. Pelvic calcifications are stable from 2018 and likely phleboliths. Lumbar spine degeneration with mild levoscoliosis. Bulky endplate spurring at the lower thoracic spine. IMPRESSION: Normal bowel gas pattern.  No excessive stool retention. Electronically Signed   By: Monte Fantasia M.D.   On: 08/06/2017 08:49    Assessment/ Plan: 62 y.o. female   1. Generalized abdominal pain Presumably secondary to constipation. No evidence of acute abdomen or infectious etiologies on exam.  We did obtain a KUB today which did not demonstrate any excessive stool burden. We'll trial on Linzess for chronic idiopathic constipation.  We'll start this at a low dose. I have given her one week worth of samples. She'll contact me if this is helpful and I will prescribe this. Reinforced fiber, increase water and exercise to maintain normal bowel patterns.  She will follow up with her PCP in one month. - DG Abd 1 View; Future  2. Chronic idiopathic constipation - DG Abd 1 View; Future   Orders Placed This Encounter  Procedures  . DG Abd 1 View    Standing Status:   Future    Number of Occurrences:   1    Standing Expiration Date:  10/06/2018    Order Specific Question:   Reason for Exam (SYMPTOM  OR DIAGNOSIS REQUIRED)    Answer:   abdominal pain/ constipation    Order Specific Question:   Preferred imaging location?    Answer:   [facility]   Meds ordered this encounter  Medications  . linaclotide (LINZESS) 72 MCG capsule    Sig: Take 1 capsule (72 mcg total) by mouth daily before breakfast.    Dispense:  8 capsule    Refill:  0     Eivin Mascio Windell Moulding, DO [place] [phone]

## 2017-08-06 NOTE — Patient Instructions (Signed)
I have given you samples of Linzess to take daily.  If you find that this medication is helpful, call me and I will send it to your pharmacy.  Follow up with Erin Hale in 4 weeks for constipation.   Constipation, Adult Constipation is when a person:  Poops (has a bowel movement) fewer times in a week than normal.  Has a hard time pooping.  Has poop that is dry, hard, or bigger than normal.  Follow these instructions at home: Eating and drinking   Eat foods that have a lot of fiber, such as: ? Fresh fruits and vegetables. ? Whole grains. ? Beans.  Eat less of foods that are high in fat, low in fiber, or overly processed, such as: ? [place] fries. ? Hamburgers. ? Cookies. ? Candy. ? Soda.  Drink enough fluid to keep your pee (urine) clear or pale yellow. General instructions  Exercise regularly or as told by your doctor.  Go to the restroom when you feel like you need to poop. Do not hold it in.  Take over-the-counter and prescription medicines only as told by your doctor. These include any fiber supplements.  Do pelvic floor retraining exercises, such as: ? Doing deep breathing while relaxing your lower belly (abdomen). ? Relaxing your pelvic floor while pooping.  Watch your condition for any changes.  Keep all follow-up visits as told by your doctor. This is important. Contact a doctor if:  You have pain that gets worse.  You have a fever.  You have not pooped for 4 days.  You throw up (vomit).  You are not hungry.  You lose weight.  You are bleeding from the anus.  You have thin, pencil-like poop (stool). Get help right away if:  You have a fever, and your symptoms suddenly get worse.  You leak poop or have blood in your poop.  Your belly feels hard or bigger than normal (is bloated).  You have very bad belly pain.  You feel dizzy or you faint. This information is not intended to replace advice given to you by your health care provider. Make  sure you discuss any questions you have with your health care provider. Document Released: 08/09/2007 Document Revised: 09/10/2015 Document Reviewed: 08/11/2015 Elsevier Interactive Patient Education  2018 [employer].

## 2017-08-24 NOTE — Progress Notes (Deleted)
Subjective: CC: abdominal pain PCP: Chevis Pretty, FNP Erin Hale is a 62 y.o. female presenting to clinic today for:  1. Abdominal pain ***.  Patient seen 3 weeks ago and started on Linzess for chronic constipation.  She had a KUB performed at that time which did not demonstrate any acute abnormalities, including substantial stool burden.   ROS: Per HPI  No Known Allergies Past Medical History:  Diagnosis Date  . Depression   . Panic disorder   . Thyroid disease    hypothyroidism    Current Outpatient Medications:  .  acetaminophen (TYLENOL) 650 MG CR tablet, Take 650 mg by mouth every 8 (eight) hours as needed for pain., Disp: , Rfl:  .  albuterol (PROVENTIL HFA;VENTOLIN HFA) 108 (90 Base) MCG/ACT inhaler, Inhale 2 puffs into the lungs every 6 (six) hours as needed for wheezing or shortness of breath., Disp: 1 Inhaler, Rfl: 0 .  budesonide-formoterol (SYMBICORT) 80-4.5 MCG/ACT inhaler, Inhale 2 puffs into the lungs 2 (two) times daily., Disp: 1 Inhaler, Rfl: 3 .  buPROPion (WELLBUTRIN XL) 150 MG 24 hr tablet, Take 1 tablet (150 mg total) by mouth daily., Disp: 30 tablet, Rfl: 5 .  clonazePAM (KLONOPIN) 0.5 MG tablet, Take 1 tablet (0.5 mg total) by mouth 2 (two) times daily as needed., Disp: 60 tablet, Rfl: 2 .  diphenhydrAMINE (BENADRYL) 25 mg capsule, Take 25 mg by mouth every 6 (six) hours as needed., Disp: , Rfl:  .  escitalopram (LEXAPRO) 20 MG tablet, Take 1 tablet (20 mg total) by mouth daily., Disp: 30 tablet, Rfl: 5 .  ibuprofen (ADVIL,MOTRIN) 200 MG tablet, Take 200 mg by mouth every 6 (six) hours as needed for pain., Disp: , Rfl:  .  levothyroxine (SYNTHROID, LEVOTHROID) 75 MCG tablet, Take 1 tablet (75 mcg total) by mouth daily before breakfast., Disp: 30 tablet, Rfl: 5 .  linaclotide (LINZESS) 72 MCG capsule, Take 1 capsule (72 mcg total) by mouth daily before breakfast., Disp: 8 capsule, Rfl: 0 .  Melatonin 5 MG CAPS, Take by mouth., Disp: , Rfl:  .   meloxicam (MOBIC) 15 MG tablet, Take 1 tablet (15 mg total) by mouth daily. (as needed for low back pain), Disp: 15 tablet, Rfl: 0 .  ranitidine (ZANTAC) 150 MG capsule, Take 1 capsule (150 mg total) by mouth 2 (two) times daily., Disp: 60 capsule, Rfl: 5 .  tiZANidine (ZANAFLEX) 4 MG tablet, Take 0.5-1 tablets (2-4 mg total) by mouth every 8 (eight) hours as needed for muscle spasms., Disp: 30 tablet, Rfl: 0 Social History   Socioeconomic History  . Marital status: Divorced    Spouse name: Not on file  . Number of children: Not on file  . Years of education: Not on file  . Highest education level: Not on file  Occupational History  . Not on file  Social Needs  . Financial resource strain: Not on file  . Food insecurity:    Worry: Not on file    Inability: Not on file  . Transportation needs:    Medical: Not on file    Non-medical: Not on file  Tobacco Use  . Smoking status: Former Smoker    Types: Cigarettes    Last attempt to quit: 03/15/1994    Years since quitting: 23.4  . Smokeless tobacco: Never Used  Substance and Sexual Activity  . Alcohol use: No  . Drug use: No  . Sexual activity: Not on file  Lifestyle  . Physical activity:  Days per week: Not on file    Minutes per session: Not on file  . Stress: Not on file  Relationships  . Social connections:    Talks on phone: Not on file    Gets together: Not on file    Attends religious service: Not on file    Active member of club or organization: Not on file    Attends meetings of clubs or organizations: Not on file    Relationship status: Not on file  . Intimate partner violence:    Fear of current or ex partner: Not on file    Emotionally abused: Not on file    Physically abused: Not on file    Forced sexual activity: Not on file  Other Topics Concern  . Not on file  Social History Narrative  . Not on file   Family History  Problem Relation Age of Onset  . Stroke Mother   . Congestive Heart Failure  Mother   . Diabetes Father   . Cancer Father        brain    Objective: Office vital signs reviewed. LMP  (LMP Unknown)   Physical Examination:  General: Awake, alert, *** nourished, No acute distress HEENT: Normal    Neck: No masses palpated. No lymphadenopathy    Ears: Tympanic membranes intact, normal light reflex, no erythema, no bulging    Eyes: PERRLA, extraocular membranes intact, sclera ***    Nose: nasal turbinates moist, *** nasal discharge    Throat: moist mucus membranes, no erythema, *** tonsillar exudate.  Airway is patent Cardio: regular rate and rhythm, S1S2 heard, no murmurs appreciated Pulm: clear to auscultation bilaterally, no wheezes, rhonchi or rales; normal work of breathing on room air GI: soft, non-tender, non-distended, bowel sounds present x4, no hepatomegaly, no splenomegaly, no masses GU: external vaginal tissue ***, cervix ***, *** punctate lesions on cervix appreciated, *** discharge from cervical os, *** bleeding, *** cervical motion tenderness, *** abdominal/ adnexal masses Extremities: warm, well perfused, No edema, cyanosis or clubbing; +*** pulses bilaterally MSK: *** gait and *** station Skin: dry; intact; no rashes or lesions Neuro: *** Strength and light touch sensation grossly intact, *** DTRs ***/4  Assessment/ Plan: 62 y.o. female   ***  No orders of the defined types were placed in this encounter.  No orders of the defined types were placed in this encounter.    Erin Norlander, DO [place] [phone]

## 2017-08-27 ENCOUNTER — Ambulatory Visit: Payer: PRIVATE HEALTH INSURANCE | Admitting: Family Medicine

## 2017-08-28 ENCOUNTER — Ambulatory Visit (INDEPENDENT_AMBULATORY_CARE_PROVIDER_SITE_OTHER): Payer: PRIVATE HEALTH INSURANCE | Admitting: Family Medicine

## 2017-08-28 ENCOUNTER — Encounter: Payer: Self-pay | Admitting: Family Medicine

## 2017-08-28 VITALS — BP 105/81 | HR 90 | Temp 97.5°F | Ht 64.0 in | Wt 247.0 lb

## 2017-08-28 DIAGNOSIS — R140 Abdominal distension (gaseous): Secondary | ICD-10-CM

## 2017-08-28 DIAGNOSIS — R634 Abnormal weight loss: Secondary | ICD-10-CM

## 2017-08-28 DIAGNOSIS — R6881 Early satiety: Secondary | ICD-10-CM

## 2017-08-28 NOTE — Patient Instructions (Signed)
You will be contacted about scheduling the CT abdomen/ pelvis.  I have placed a referral to GI as well.  Follow up with Erin Hale in 2 weeks for weight check and to discuss CT results.

## 2017-08-28 NOTE — Progress Notes (Signed)
Subjective: CC: abdominal discomfort PCP: Chevis Pretty, FNP IRW:ERXVQ E Fedorchak is a 62 y.o. female presenting to clinic today for:  1. Abdominal discomfort Patient reports that she has had persistent abdominal bloating and sensation that her stomach is hard.  She points to the epigastric area as the area of most concern.  She reports early satiety but notes good appetite.  Denies any hematochezia, melena.  She reports that often her stools are loose or soft.  Denies substantial straining or discomfort with bowel movements.  She has been using Dulcolax in efforts to improve her symptoms with little improvement.  She was also placed on a trial of Linzess but she did not find this very helpful either.  She also takes Zantac twice a day.  Denies any reflux symptoms.  Denies any nausea, vomiting.  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  She is lost 5 pounds since last visit.  She has never had a screening colonoscopy because she is "partially insured".  ROS: Per HPI  No Known Allergies Past Medical History:  Diagnosis Date  . Depression   . Panic disorder   . Thyroid disease    hypothyroidism    Current Outpatient Medications:  .  acetaminophen (TYLENOL) 650 MG CR tablet, Take 650 mg by mouth every 8 (eight) hours as needed for pain., Disp: , Rfl:  .  albuterol (PROVENTIL HFA;VENTOLIN HFA) 108 (90 Base) MCG/ACT inhaler, Inhale 2 puffs into the lungs every 6 (six) hours as needed for wheezing or shortness of breath., Disp: 1 Inhaler, Rfl: 0 .  budesonide-formoterol (SYMBICORT) 80-4.5 MCG/ACT inhaler, Inhale 2 puffs into the lungs 2 (two) times daily., Disp: 1 Inhaler, Rfl: 3 .  buPROPion (WELLBUTRIN XL) 150 MG 24 hr tablet, Take 1 tablet (150 mg total) by mouth daily., Disp: 30 tablet, Rfl: 5 .  clonazePAM (KLONOPIN) 0.5 MG tablet, Take 1 tablet (0.5 mg total) by mouth 2 (two) times daily as needed., Disp: 60 tablet, Rfl:  2 .  diphenhydrAMINE (BENADRYL) 25 mg capsule, Take 25 mg by mouth every 6 (six) hours as needed., Disp: , Rfl:  .  escitalopram (LEXAPRO) 20 MG tablet, Take 1 tablet (20 mg total) by mouth daily., Disp: 30 tablet, Rfl: 5 .  ibuprofen (ADVIL,MOTRIN) 200 MG tablet, Take 200 mg by mouth every 6 (six) hours as needed for pain., Disp: , Rfl:  .  levothyroxine (SYNTHROID, LEVOTHROID) 75 MCG tablet, Take 1 tablet (75 mcg total) by mouth daily before breakfast., Disp: 30 tablet, Rfl: 5 .  linaclotide (LINZESS) 72 MCG capsule, Take 1 capsule (72 mcg total) by mouth daily before breakfast., Disp: 8 capsule, Rfl: 0 .  Melatonin 5 MG CAPS, Take by mouth., Disp: , Rfl:  .  meloxicam (MOBIC) 15 MG tablet, Take 1 tablet (15 mg total) by mouth daily. (as needed for low back pain), Disp: 15 tablet, Rfl: 0 .  ranitidine (ZANTAC) 150 MG capsule, Take 1 capsule (150 mg total) by mouth 2 (two) times daily., Disp: 60 capsule, Rfl: 5 .  tiZANidine (ZANAFLEX) 4 MG tablet, Take 0.5-1 tablets (2-4 mg total) by mouth every 8 (eight) hours as needed for muscle spasms., Disp: 30 tablet, Rfl: 0 Social History   Socioeconomic History  . Marital status: Divorced    Spouse name: Not on file  . Number of children: Not on file  . Years of education: Not on file  . Highest education level:  Not on file  Occupational History  . Not on file  Social Needs  . Financial resource strain: Not on file  . Food insecurity:    Worry: Not on file    Inability: Not on file  . Transportation needs:    Medical: Not on file    Non-medical: Not on file  Tobacco Use  . Smoking status: Former Smoker    Types: Cigarettes    Last attempt to quit: 03/15/1994    Years since quitting: 23.4  . Smokeless tobacco: Never Used  Substance and Sexual Activity  . Alcohol use: No  . Drug use: No  . Sexual activity: Not on file  Lifestyle  . Physical activity:    Days per week: Not on file    Minutes per session: Not on file  . Stress: Not on  file  Relationships  . Social connections:    Talks on phone: Not on file    Gets together: Not on file    Attends religious service: Not on file    Active member of club or organization: Not on file    Attends meetings of clubs or organizations: Not on file    Relationship status: Not on file  . Intimate partner violence:    Fear of current or ex partner: Not on file    Emotionally abused: Not on file    Physically abused: Not on file    Forced sexual activity: Not on file  Other Topics Concern  . Not on file  Social History Narrative  . Not on file   Family History  Problem Relation Age of Onset  . Stroke Mother   . Congestive Heart Failure Mother   . Diabetes Father   . Cancer Father        brain    Objective: Office vital signs reviewed. BP 105/81   Pulse 90   Temp (!) 97.5 F (36.4 C) (Oral)   Ht 5\' 4"  (1.626 m)   Wt 247 lb (112 kg)   LMP  (LMP Unknown)   BMI 42.40 kg/m   Physical Examination:  General: Awake, alert, obese, No acute distress HEENT: sclera white, MMM Pulm:  normal work of breathing on room air GI: soft, obese, non-tender, non-distended, bowel sounds present x4, no hepatomegaly, no splenomegaly, no palpable masses  Dg Abd 1 View  Result Date: 08/06/2017 CLINICAL DATA:  Abdominal pain and constipation EXAM: ABDOMEN - 1 VIEW COMPARISON:  11/14/2016 FINDINGS: Mild to moderate stool volume without obstruction or rectal impaction. No evidence of small bowel obstruction. No concerning mass effect or calcification. Pelvic calcifications are stable from 2018 and likely phleboliths. Lumbar spine degeneration with mild levoscoliosis. Bulky endplate spurring at the lower thoracic spine. IMPRESSION: Normal bowel gas pattern.  No excessive stool retention. Electronically Signed   By: Monte Fantasia M.D.   On: 08/06/2017 08:49    Assessment/ Plan: 62 y.o. female   1. Abnormal weight loss Since April, she is lost 14 pounds.  Weight loss has been  unintentional.  Given early satiety and abdominal bloating, will refer to GI.  We obtained a KUB at last visit which was fairly unremarkable.  No significant stool burden appreciated.  I have ordered a CT abdomen pelvis for further evaluation given unplanned weight loss and constellation of other symptoms.  Must rule out malignant process.  She is never had a screening colonoscopy because of being underinsured.  She will follow-up with her PCP in 2 weeks for review of  CT abdomen pelvis results and repeat weight check. - CT Abdomen Pelvis Wo Contrast; Future - Ambulatory referral to Gastroenterology  2. Early satiety - CT Abdomen Pelvis Wo Contrast; Future - Ambulatory referral to Gastroenterology  3. Abdominal bloating - Ambulatory referral to Gastroenterology   Orders Placed This Encounter  Procedures  . CT Abdomen Pelvis Wo Contrast    Standing Status:   Future    Standing Expiration Date:   11/29/2018    Order Specific Question:   Preferred imaging location?    Answer:   [facility]    Order Specific Question:   Is Oral Contrast requested for this exam?    Answer:   Yes, Per Radiology protocol    Order Specific Question:   Radiology Contrast Protocol - do NOT remove file path    Answer:   [email or web address]  . Ambulatory referral to Gastroenterology    Referral Priority:   Routine    Referral Type:   Consultation    Referral Reason:   Specialty Services Required    Number of Visits Requested:   [place], [place] [phone]

## 2017-09-03 ENCOUNTER — Encounter: Payer: Self-pay | Admitting: Gastroenterology

## 2017-09-04 ENCOUNTER — Other Ambulatory Visit: Payer: Self-pay | Admitting: Nurse Practitioner

## 2017-09-04 DIAGNOSIS — F3341 Major depressive disorder, recurrent, in partial remission: Secondary | ICD-10-CM

## 2017-09-04 NOTE — Addendum Note (Signed)
Addended by: Antonietta Barcelona D on: 09/04/2017 10:12 AM   Modules accepted: Orders

## 2017-09-06 ENCOUNTER — Other Ambulatory Visit: Payer: Self-pay | Admitting: Nurse Practitioner

## 2017-09-06 DIAGNOSIS — F3341 Major depressive disorder, recurrent, in partial remission: Secondary | ICD-10-CM

## 2017-09-07 ENCOUNTER — Telehealth: Payer: Self-pay | Admitting: Nurse Practitioner

## 2017-09-07 DIAGNOSIS — F3341 Major depressive disorder, recurrent, in partial remission: Secondary | ICD-10-CM

## 2017-09-07 NOTE — Telephone Encounter (Signed)
Has already been done.  

## 2017-09-10 ENCOUNTER — Other Ambulatory Visit: Payer: Self-pay

## 2017-09-10 ENCOUNTER — Ambulatory Visit: Payer: PRIVATE HEALTH INSURANCE | Admitting: Nurse Practitioner

## 2017-09-10 DIAGNOSIS — R109 Unspecified abdominal pain: Secondary | ICD-10-CM

## 2017-09-17 ENCOUNTER — Ambulatory Visit (INDEPENDENT_AMBULATORY_CARE_PROVIDER_SITE_OTHER): Payer: PRIVATE HEALTH INSURANCE | Admitting: Family Medicine

## 2017-09-17 VITALS — BP 118/71 | HR 99 | Temp 98.3°F | Ht 64.0 in | Wt 243.0 lb

## 2017-09-17 DIAGNOSIS — R634 Abnormal weight loss: Secondary | ICD-10-CM | POA: Diagnosis not present

## 2017-09-17 DIAGNOSIS — F3341 Major depressive disorder, recurrent, in partial remission: Secondary | ICD-10-CM

## 2017-09-17 DIAGNOSIS — E039 Hypothyroidism, unspecified: Secondary | ICD-10-CM

## 2017-09-17 DIAGNOSIS — F410 Panic disorder [episodic paroxysmal anxiety] without agoraphobia: Secondary | ICD-10-CM | POA: Insufficient documentation

## 2017-09-17 MED ORDER — CLONAZEPAM 0.5 MG PO TABS: 0.5000 mg | ORAL_TABLET | Freq: Two times a day (BID) | ORAL | 2 refills | Status: DC | PRN

## 2017-09-17 NOTE — Progress Notes (Signed)
Subjective: RD:EYCXKGYJEH/ GAD/ unintended weight loss PCP: Chevis Pretty, FNP UDJ:SHFWY E Rossano is a 62 y.o. female presenting to clinic today for:  1. Depressive disorder/anxiety, unintended weight loss Patient reports a long-standing history of depression.  She states that she was diagnosed over 20 years ago.  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  She has been treated with Lexapro 20 mg, Wellbutrin 150 mg and Klonopin 0.5 mg twice daily as needed by her previous PCP.  Denies excessive sedation from medications.  She notes that she has been taking Klonopin sometimes 3 times daily for increased anxiety symptoms.  She notes that her symptoms are fairly controlled on this except for most recently because she is been ill.  She states that she has quite a bit of anxiety surrounding her GI symptoms that have yet to be diagnosed.  She has an appoint with gastroenterology in September.  She is trying to figure out what is going with her insurance, as there was a CT abdomen pelvis ordered at previous visit for unplanned weight loss.  However, there has been issues getting in touch with her insurance company to get approval for this imaging study.  She reports that she continues to have abdominal bloating but slightly better than last visit.  She has been avoiding certain foods that are sugary and this seems to be helping.  Denies any nausea, vomiting, hematochezia or melena.  She has alternating diarrhea and constipation.  2.  Thyroid disorder Patient reports she is diagnosed with hypothyroidism many years ago.  She had a thyroid imaging study performed at that time which she does not recall the results of.  She thinks that her mother has a thyroid disorder as well.  She reports alternating diarrhea and constipation as above.  She notes increased anxiety symptoms.  Denies heart palpitations, visual disturbance.  She wears glasses at baseline.  No tremor.  She is heat intolerant.   She has been losing weight unexpectedly as above.   ROS: Per HPI  No Known Allergies Past Medical History:  Diagnosis Date  . Depression   . Panic disorder   . Thyroid disease    hypothyroidism    Current Outpatient Medications:  .  acetaminophen (TYLENOL) 650 MG CR tablet, Take 650 mg by mouth every 8 (eight) hours as needed for pain., Disp: , Rfl:  .  albuterol (PROVENTIL HFA;VENTOLIN HFA) 108 (90 Base) MCG/ACT inhaler, Inhale 2 puffs into the lungs every 6 (six) hours as needed for wheezing or shortness of breath., Disp: 1 Inhaler, Rfl: 0 .  budesonide-formoterol (SYMBICORT) 80-4.5 MCG/ACT inhaler, Inhale 2 puffs into the lungs 2 (two) times daily., Disp: 1 Inhaler, Rfl: 3 .  buPROPion (WELLBUTRIN XL) 150 MG 24 hr tablet, Take 1 tablet (150 mg total) by mouth daily., Disp: 30 tablet, Rfl: 5 .  clonazePAM (KLONOPIN) 0.5 MG tablet, TAKE 1 TABLET BY MOUTH TWICE DAILY AS NEEDED, Disp: 60 tablet, Rfl: 0 .  diphenhydrAMINE (BENADRYL) 25 mg capsule, Take 25 mg by mouth every 6 (six) hours as needed., Disp: , Rfl:  .  escitalopram (LEXAPRO) 20 MG tablet, Take 1 tablet (20 mg total) by mouth daily., Disp: 30 tablet, Rfl: 5 .  levothyroxine (SYNTHROID, LEVOTHROID) 75 MCG tablet, Take 1 tablet (75 mcg total) by mouth daily before breakfast., Disp: 30 tablet, Rfl: 5 .  Melatonin 5 MG CAPS, Take by mouth., Disp: , Rfl:  .  meloxicam (MOBIC) 15 MG tablet, Take 1 tablet (15 mg total) by  mouth daily. (as needed for low back pain), Disp: 15 tablet, Rfl: 0 .  ranitidine (ZANTAC) 150 MG capsule, Take 1 capsule (150 mg total) by mouth 2 (two) times daily., Disp: 60 capsule, Rfl: 5 .  tiZANidine (ZANAFLEX) 4 MG tablet, Take 0.5-1 tablets (2-4 mg total) by mouth every 8 (eight) hours as needed for muscle spasms., Disp: 30 tablet, Rfl: 0 Social History   Socioeconomic History  . Marital status: Divorced    Spouse name: Not on file  . Number of children: Not on file  . Years of education: Not on file  .  Highest education level: Not on file  Occupational History  . Not on file  Social Needs  . Financial resource strain: Not on file  . Food insecurity:    Worry: Not on file    Inability: Not on file  . Transportation needs:    Medical: Not on file    Non-medical: Not on file  Tobacco Use  . Smoking status: Former Smoker    Types: Cigarettes    Last attempt to quit: 03/15/1994    Years since quitting: 23.5  . Smokeless tobacco: Never Used  Substance and Sexual Activity  . Alcohol use: No  . Drug use: No  . Sexual activity: Not on file  Lifestyle  . Physical activity:    Days per week: Not on file    Minutes per session: Not on file  . Stress: Not on file  Relationships  . Social connections:    Talks on phone: Not on file    Gets together: Not on file    Attends religious service: Not on file    Active member of club or organization: Not on file    Attends meetings of clubs or organizations: Not on file    Relationship status: Not on file  . Intimate partner violence:    Fear of current or ex partner: Not on file    Emotionally abused: Not on file    Physically abused: Not on file    Forced sexual activity: Not on file  Other Topics Concern  . Not on file  Social History Narrative  . Not on file   Family History  Problem Relation Age of Onset  . Stroke Mother   . Congestive Heart Failure Mother   . Diabetes Father   . Cancer Father        brain    Objective: Office vital signs reviewed. BP 118/71   Pulse 99   Temp 98.3 F (36.8 C) (Oral)   Ht '5\' 4"'$  (1.626 m)   Wt 243 lb (110.2 kg)   LMP  (LMP Unknown)   BMI 41.71 kg/m   Physical Examination:  General: Awake, alert, obese, No acute distress HEENT: Normal    Neck: No masses palpated. No lymphadenopathy; no thyroid masses palpable. No goiter.    Eyes: PERRLA, extraocular membranes intact, sclera white; no exophthalmos    Throat: moist mucus membranes Cardio: regular rate and rhythm, S1S2 heard, no  murmurs appreciated Pulm: clear to auscultation bilaterally, no wheezes, rhonchi or rales; normal work of breathing on room air GI: Obese, soft, non-tender, non-distended, bowel sounds present x4, no hepatomegaly, no splenomegaly, no masses Extremities: warm, well perfused, No edema, cyanosis or clubbing; +2 pulses bilaterally MSK: Normal gait and normal station Skin: dry; intact; no rashes or lesions; normal temperature  Neuro: no resting tremor Psych: mood stable, speech normal, affect appropriate, good eye contact, does not appear to be  responding to internal stimuli.  Depression screen [facility] 2/9 09/17/2017 08/28/2017 08/06/2017  Decreased Interest 2 1 0  Down, Depressed, Hopeless 2 0 0  PHQ - 2 Score 4 1 0  Altered sleeping 1 - -  Tired, decreased energy 2 - -  Change in appetite 2 - -  Feeling bad or failure about yourself  2 - -  Trouble concentrating 1 - -  Moving slowly or fidgety/restless 1 - -  Suicidal thoughts 0 - -  PHQ-9 Score 13 - -  Difficult doing work/chores Somewhat difficult - -   GAD 7 : Generalized Anxiety Score 09/17/2017 05/02/2016 09/21/2015 03/23/2015  Nervous, Anxious, on Edge '2 1 2 3  '$ Control/stop worrying 2 0 2 2  Worry too much - different things 2 0 1 2  Trouble relaxing '1 1 1 1  '$ Restless 0 0 1 1  Easily annoyed or irritable 1 0 1 1  Afraid - awful might happen 2 0 1 0  Total GAD 7 Score '10 2 9 10  '$ Anxiety Difficulty Somewhat difficult Not difficult at all Somewhat difficult Somewhat difficult    Assessment/ Plan: 62 y.o. female   1. Recurrent major depressive disorder, in partial remission [facility]) Patient with an increased PHQ 9 score compared to previous visit.  She is currently on Lexapro 20 mg, Wellbutrin XL 150 and Klonopin.  I have considered increasing her Wellbutrin but I worry that this may worsen panic and anxiety symptoms.  I think that increased symptoms are situational given undiagnosed abdominal symptoms.  Will hold off on medication adjustment  for now. - clonazePAM (KLONOPIN) 0.5 MG tablet; Take 1 tablet (0.5 mg total) by mouth 2 (two) times daily as needed for anxiety (panic).  Dispense: 60 tablet; Refill: 2  2. Hypothyroidism, unspecified type Last TSH was within normal limits.  Will check today given unplanned weight loss, increased anxiety symptoms.  Will contact patient with results tomorrow. - TSH  3. Unexplained weight loss Has appointment with GI in September.  Still working to get her CT abdomen pelvis.  We discussed that if symptoms were to worsen, she develop blood in her stool, difficulty tolerating p.o. intake she is to seek immediate medical attention the emergency department. - CMP14+EGFR - CBC with Differential  4. Panic attack Klonopin refilled.  May fill 30 days after last, anticipate August 1.  Check UDS today.  Controlled substance contract completed today.  Follow-up in 3 months or sooner if needed.  The Narcotic Database has been reviewed.  There were no red flags.     Orders Placed This Encounter  Procedures  . CMP14+EGFR  . CBC with Differential  . TSH  . ToxASSURE Select 13 (MW), Urine   Meds ordered this encounter  Medications  . DISCONTD: clonazePAM (KLONOPIN) 0.5 MG tablet    Sig: Take 1 tablet (0.5 mg total) by mouth 2 (two) times daily as needed for anxiety (panic).    Dispense:  60 tablet    Refill:  2  . clonazePAM (KLONOPIN) 0.5 MG tablet    Sig: Take 1 tablet (0.5 mg total) by mouth 2 (two) times daily as needed for anxiety (panic).    Dispense:  60 tablet    Refill:  [place], [place] [phone]

## 2017-09-17 NOTE — Patient Instructions (Signed)
Follow up in Oct/November or sooner if needed.  You had labs performed today.  You will be contacted with the results of the labs once they are available, usually in the next 3 business days for routine lab work.    Controlled substance Guidelines:  1. You cannot get an early refill, even it is lost.  2. You cannot get pain medications from any other doctor, unless it is the emergency department and related to a new problem or injury.  3. You cannot use alcohol, marijuana, cocaine or any other recreational drugs while using this medication. This is very dangerous.  4. You are willing to have your urine drug tested at each visit.  5. You will not drive while using this medication, because that can put yourself and others in serious danger of an accident. 6. If any medication is stolen, then there must be a police report to verify it, or it cannot be refilled.  7. I will not prescribe these medications for longer than 3 months.  8. You must bring your pill bottle to each visit.  9. You must use the same pharmacy for all refills for the medication, unless you clear it with me beforehand.  10. You cannot share or sell this medication.

## 2017-09-18 ENCOUNTER — Other Ambulatory Visit: Payer: Self-pay

## 2017-09-18 ENCOUNTER — Other Ambulatory Visit: Payer: Self-pay | Admitting: Family Medicine

## 2017-09-18 DIAGNOSIS — N179 Acute kidney failure, unspecified: Secondary | ICD-10-CM

## 2017-09-18 DIAGNOSIS — R7989 Other specified abnormal findings of blood chemistry: Secondary | ICD-10-CM

## 2017-09-18 LAB — CMP14+EGFR
ALBUMIN: 4.5 g/dL (ref 3.6–4.8)
ALT: 29 IU/L (ref 0–32)
AST: 26 IU/L (ref 0–40)
Albumin/Globulin Ratio: 2 (ref 1.2–2.2)
Alkaline Phosphatase: 112 IU/L (ref 39–117)
BUN / CREAT RATIO: 10 — AB (ref 12–28)
BUN: 14 mg/dL (ref 8–27)
Bilirubin Total: 0.4 mg/dL (ref 0.0–1.2)
CALCIUM: 9.6 mg/dL (ref 8.7–10.3)
CO2: 23 mmol/L (ref 20–29)
CREATININE: 1.34 mg/dL — AB (ref 0.57–1.00)
Chloride: 101 mmol/L (ref 96–106)
GFR calc Af Amer: 49 mL/min/{1.73_m2} — ABNORMAL LOW (ref >59)
GFR, EST NON AFRICAN AMERICAN: 43 mL/min/{1.73_m2} — AB (ref >59)
GLOBULIN, TOTAL: 2.2 g/dL (ref 1.5–4.5)
Glucose: 120 mg/dL — ABNORMAL HIGH (ref 65–99)
Potassium: 4 mmol/L (ref 3.5–5.2)
SODIUM: 141 mmol/L (ref 134–144)
Total Protein: 6.7 g/dL (ref 6.0–8.5)

## 2017-09-18 LAB — CBC WITH DIFFERENTIAL/PLATELET
BASOS: 0 %
Basophils Absolute: 0 10*3/uL (ref 0.0–0.2)
EOS (ABSOLUTE): 0.2 10*3/uL (ref 0.0–0.4)
EOS: 2 %
HEMATOCRIT: 45.6 % (ref 34.0–46.6)
Hemoglobin: 16 g/dL — ABNORMAL HIGH (ref 11.1–15.9)
IMMATURE GRANULOCYTES: 0 %
Immature Grans (Abs): 0 10*3/uL (ref 0.0–0.1)
LYMPHS ABS: 2.5 10*3/uL (ref 0.7–3.1)
Lymphs: 27 %
MCH: 29.3 pg (ref 26.6–33.0)
MCHC: 35.1 g/dL (ref 31.5–35.7)
MCV: 83 fL (ref 79–97)
MONOS ABS: 0.7 10*3/uL (ref 0.1–0.9)
Monocytes: 7 %
Neutrophils Absolute: 5.9 10*3/uL (ref 1.4–7.0)
Neutrophils: 64 %
Platelets: 283 10*3/uL (ref 150–450)
RBC: 5.47 x10E6/uL — ABNORMAL HIGH (ref 3.77–5.28)
RDW: 14.4 % (ref 12.3–15.4)
WBC: 9.2 10*3/uL (ref 3.4–10.8)

## 2017-09-18 LAB — TSH: TSH: 4.18 u[IU]/mL (ref 0.450–4.500)

## 2017-09-20 LAB — TOXASSURE SELECT 13 (MW), URINE

## 2017-09-25 ENCOUNTER — Telehealth: Payer: Self-pay | Admitting: Family Medicine

## 2017-09-25 NOTE — Telephone Encounter (Signed)
She is supposed to come back in for labs this week. Order was placed. Left message with this information.

## 2017-10-01 ENCOUNTER — Other Ambulatory Visit: Payer: PRIVATE HEALTH INSURANCE

## 2017-10-01 DIAGNOSIS — N179 Acute kidney failure, unspecified: Secondary | ICD-10-CM | POA: Diagnosis not present

## 2017-10-01 LAB — BMP8+EGFR
BUN / CREAT RATIO: 15 (ref 12–28)
BUN: 15 mg/dL (ref 8–27)
CO2: 18 mmol/L — ABNORMAL LOW (ref 20–29)
CREATININE: 0.99 mg/dL (ref 0.57–1.00)
Calcium: 9.1 mg/dL (ref 8.7–10.3)
Chloride: 104 mmol/L (ref 96–106)
GFR, EST AFRICAN AMERICAN: 71 mL/min/{1.73_m2} (ref >59)
GFR, EST NON AFRICAN AMERICAN: 62 mL/min/{1.73_m2} (ref >59)
Glucose: 112 mg/dL — ABNORMAL HIGH (ref 65–99)
Potassium: 3.9 mmol/L (ref 3.5–5.2)
Sodium: 143 mmol/L (ref 134–144)

## 2017-10-02 ENCOUNTER — Ambulatory Visit: Payer: PRIVATE HEALTH INSURANCE | Admitting: Nurse Practitioner

## 2017-10-03 ENCOUNTER — Telehealth: Payer: Self-pay | Admitting: Nurse Practitioner

## 2017-10-04 NOTE — Telephone Encounter (Signed)
Pt aware of results 

## 2017-11-01 ENCOUNTER — Other Ambulatory Visit: Payer: Self-pay | Admitting: Nurse Practitioner

## 2017-11-01 DIAGNOSIS — E039 Hypothyroidism, unspecified: Secondary | ICD-10-CM

## 2017-11-29 ENCOUNTER — Telehealth: Payer: Self-pay | Admitting: *Deleted

## 2017-11-29 ENCOUNTER — Encounter: Payer: Self-pay | Admitting: Gastroenterology

## 2017-11-29 ENCOUNTER — Encounter: Payer: Self-pay | Admitting: *Deleted

## 2017-11-29 ENCOUNTER — Other Ambulatory Visit: Payer: Self-pay | Admitting: *Deleted

## 2017-11-29 ENCOUNTER — Ambulatory Visit (INDEPENDENT_AMBULATORY_CARE_PROVIDER_SITE_OTHER): Payer: No Typology Code available for payment source | Admitting: Gastroenterology

## 2017-11-29 VITALS — BP 131/80 | HR 104 | Temp 96.6°F | Ht 64.0 in | Wt 250.0 lb

## 2017-11-29 DIAGNOSIS — R1013 Epigastric pain: Secondary | ICD-10-CM

## 2017-11-29 DIAGNOSIS — Z1211 Encounter for screening for malignant neoplasm of colon: Secondary | ICD-10-CM

## 2017-11-29 DIAGNOSIS — R1310 Dysphagia, unspecified: Secondary | ICD-10-CM | POA: Insufficient documentation

## 2017-11-29 MED ORDER — PEG 3350-KCL-NA BICARB-NACL 420 G PO SOLR: 4000.0000 mL | Freq: Once | ORAL | 0 refills | Status: AC

## 2017-11-29 MED ORDER — PANTOPRAZOLE SODIUM 40 MG PO TBEC
40.0000 mg | DELAYED_RELEASE_TABLET | Freq: Every day | ORAL | 3 refills | Status: DC
Start: 2017-11-29 — End: 2019-06-20

## 2017-11-29 NOTE — Telephone Encounter (Signed)
Called # on back of card [phone] but received message "office is currently closed, hours are 8am-8pm est".

## 2017-11-29 NOTE — Telephone Encounter (Signed)
Pre-op scheduled for 01/15/18 at 12:45pm. Letter mailed. LMOVM.

## 2017-11-29 NOTE — Telephone Encounter (Signed)
Called patient plan multiplan at [phone] and received a message that they do not do precerts. Patient does not have any other copies of insurance card.  Called patient and LMOVM advising I need a copy of insurance card to call to see if PA is required for procedure.

## 2017-11-29 NOTE — Patient Instructions (Addendum)
We have arranged a colonoscopy, upper endoscopy, and dilation with Dr. Oneida Alar in the near future.  I would like for you to stop Zantac and start Protonix once each morning, 30 minutes before breakfast. I sent this to the pharmacy.  Continue to make the healthy choices. If you find that you are constipated, call us so we can start something new.   I will see you in follow-up after the procedures!  It was a pleasure to see you today. I strive to create trusting relationships with patients to provide genuine, compassionate, and quality care. I value your feedback. If you receive a survey regarding your visit,  I greatly appreciate you taking time to fill this out.   Annitta Needs, PhD, ANP-BC [facility]    Food Choices for Gastroesophageal Reflux Disease, Adult When you have gastroesophageal reflux disease (GERD), the foods you eat and your eating habits are very important. Choosing the right foods can help ease your discomfort. What guidelines do I need to follow?  Choose fruits, vegetables, whole grains, and low-fat dairy products.  Choose low-fat meat, fish, and poultry.  Limit fats such as oils, salad dressings, butter, nuts, and avocado.  Keep a food diary. This helps you identify foods that cause symptoms.  Avoid foods that cause symptoms. These may be different for everyone.  Eat small meals often instead of 3 large meals a day.  Eat your meals slowly, in a place where you are relaxed.  Limit fried foods.  Cook foods using methods other than frying.  Avoid drinking alcohol.  Avoid drinking large amounts of liquids with your meals.  Avoid bending over or lying down until 2-3 hours after eating. What foods are not recommended? These are some foods and drinks that may make your symptoms worse: Vegetables Tomatoes. Tomato juice. Tomato and spaghetti sauce. Chili peppers. Onion and garlic. Horseradish. Fruits Oranges, grapefruit, and lemon (fruit and  juice). Meats High-fat meats, fish, and poultry. This includes hot dogs, ribs, ham, sausage, salami, and bacon. Dairy Whole milk and chocolate milk. Sour cream. Cream. Butter. Ice cream. Cream cheese. Drinks Coffee and tea. Bubbly (carbonated) drinks or energy drinks. Condiments Hot sauce. Barbecue sauce. Sweets/Desserts Chocolate and cocoa. Donuts. Peppermint and spearmint. Fats and Oils High-fat foods. This includes [place] fries and potato chips. Other Vinegar. Strong spices. This includes black pepper, white pepper, red pepper, cayenne, curry powder, cloves, ginger, and chili powder. The items listed above may not be a complete list of foods and drinks to avoid. Contact your dietitian for more information. This information is not intended to replace advice given to you by your health care provider. Make sure you discuss any questions you have with your health care provider. Document Released: 08/22/2011 Document Revised: 07/29/2015 Document Reviewed: 12/25/2012 Elsevier Interactive Patient Education  2017 [employer].

## 2017-11-29 NOTE — Progress Notes (Addendum)
REVIEWED-NO ADDITIONAL RECOMMENDATIONS.  Primary Care Physician:  Chevis Pretty, FNP Primary Gastroenterologist:  Dr. Oneida Alar   Chief Complaint  Patient presents with  . Bloated  . Diarrhea    HPI:   Erin Hale is a 62 y.o. female presenting today at the request of her PCP secondary to bloating, weight loss, early satiety. No prior colonoscopy/EGD.  Notes bloating upper abdomen, sometimes takes Dulcolax to help "unload". Cheese and sweets worsens symptoms. Been off of work with FMLA due to symptoms. Works at [employer]. Nausea underlying. Gas-x helps some. Better feeling when not eating cheese, sweets. Sometimes feels like food is backing up in chest, regurgitates.. Reflux is better with changing diet. Feels constipated, "soft serve" small amounts but also better with diet. Scant blood with straining one time. Currently only taking Zantac.   Lost down from the 270s to 240s, now gaining weight.   Past Medical History:  Diagnosis Date  . Bronchial asthma   . Depression   . GERD (gastroesophageal reflux disease)   . Panic disorder   . Thyroid disease    hypothyroidism    Past Surgical History:  Procedure Laterality Date  . ABDOMINAL HYSTERECTOMY      Current Outpatient Medications  Medication Sig Dispense Refill  . acetaminophen (TYLENOL) 650 MG CR tablet Take 650 mg by mouth every 8 (eight) hours as needed for pain.    Marland Kitchen albuterol (PROVENTIL HFA;VENTOLIN HFA) 108 (90 Base) MCG/ACT inhaler Inhale 2 puffs into the lungs every 6 (six) hours as needed for wheezing or shortness of breath. 1 Inhaler 0  . buPROPion (WELLBUTRIN XL) 150 MG 24 hr tablet Take 1 tablet (150 mg total) by mouth daily. 30 tablet 5  . clonazePAM (KLONOPIN) 0.5 MG tablet Take 1 tablet (0.5 mg total) by mouth 2 (two) times daily as needed for anxiety (panic). 60 tablet 2  . diphenhydrAMINE (BENADRYL) 25 mg capsule Take 25 mg by mouth every 6 (six) hours as needed.    Marland Kitchen escitalopram (LEXAPRO) 20 MG  tablet Take 1 tablet (20 mg total) by mouth daily. 30 tablet 5  . levothyroxine (SYNTHROID, LEVOTHROID) 75 MCG tablet TAKE 1 TABLET BY MOUTH ONCE DAILY BEFORE BREAKFAST 30 tablet 4  . Melatonin 5 MG CAPS Take by mouth.    . ranitidine (ZANTAC) 150 MG capsule Take 1 capsule (150 mg total) by mouth 2 (two) times daily. 60 capsule 5  . simethicone (MYLICON) 024 MG chewable tablet Chew 250 mg by mouth as needed for flatulence.    . pantoprazole (PROTONIX) 40 MG tablet Take 1 tablet (40 mg total) by mouth daily. Take 30 minutes before breakfast 90 tablet 3   No current facility-administered medications for this visit.     Allergies as of 11/29/2017  . (No Known Allergies)    Family History  Problem Relation Age of Onset  . Stroke Mother   . Congestive Heart Failure Mother   . Diabetes Father   . Cancer Father        brain  . Colon cancer Neg Hx   . Colon polyps Neg Hx     Social History   Socioeconomic History  . Marital status: Divorced    Spouse name: Not on file  . Number of children: Not on file  . Years of education: Not on file  . Highest education level: Not on file  Occupational History  . Not on file  Social Needs  . Financial resource strain: Not on file  . Food  insecurity:    Worry: Not on file    Inability: Not on file  . Transportation needs:    Medical: Not on file    Non-medical: Not on file  Tobacco Use  . Smoking status: Former Smoker    Types: Cigarettes    Last attempt to quit: 03/15/1994    Years since quitting: 23.7  . Smokeless tobacco: Never Used  Substance and Sexual Activity  . Alcohol use: No  . Drug use: No  . Sexual activity: Not on file  Lifestyle  . Physical activity:    Days per week: Not on file    Minutes per session: Not on file  . Stress: Not on file  Relationships  . Social connections:    Talks on phone: Not on file    Gets together: Not on file    Attends religious service: Not on file    Active member of club or  organization: Not on file    Attends meetings of clubs or organizations: Not on file    Relationship status: Not on file  . Intimate partner violence:    Fear of current or ex partner: Not on file    Emotionally abused: Not on file    Physically abused: Not on file    Forced sexual activity: Not on file  Other Topics Concern  . Not on file  Social History Narrative  . Not on file    Review of Systems: Gen: Denies any fever, chills, fatigue, weight loss, lack of appetite.  CV: Denies chest pain, heart palpitations, peripheral edema, syncope.  Resp: Denies shortness of breath at rest or with exertion. Denies wheezing or cough.  GI: see HPI  GU : Denies urinary burning, urinary frequency, urinary hesitancy MS: Denies joint pain, muscle weakness, cramps, or limitation of movement.  Derm: Denies rash, itching, dry skin Psych: Denies depression, anxiety, memory loss, and confusion Heme: Denies bruising, bleeding, and enlarged lymph nodes.  Physical Exam: BP 131/80   Pulse (!) 104   Temp (!) 96.6 F (35.9 C) (Oral)   Ht 5\' 4"  (1.626 m)   Wt 250 lb (113.4 kg)   LMP  (LMP Unknown)   BMI 42.91 kg/m  General:   Alert and oriented. Pleasant and cooperative. Well-nourished and well-developed.  Head:  Normocephalic and atraumatic. Eyes:  Without icterus, sclera clear and conjunctiva pink.  Ears:  Normal auditory acuity. Nose:  No deformity, discharge,  or lesions. Mouth:  No deformity or lesions, oral mucosa pink.  Lungs:  Clear to auscultation bilaterally. No wheezes, rales, or rhonchi. No distress.  Heart:  S1, S2 present without murmurs appreciated.  Abdomen:  +BS, soft, non-tender and non-distended. No HSM noted. No guarding or rebound. No masses appreciated.  Rectal:  Deferred  Msk:  Symmetrical without gross deformities. Normal posture. Extremities:  Without  edema. Neurologic:  Alert and  oriented x4;  grossly normal neurologically. Skin:  Intact without significant lesions  or rashes. Psych:  Alert and cooperative. Normal mood and affect.

## 2017-11-30 NOTE — Telephone Encounter (Signed)
Called # on back of card [phone] but received message "office is currently closed, hours are 8am-8pm est".

## 2017-11-30 NOTE — Telephone Encounter (Signed)
Received call from [place] at [phone]. Was advised patient no longer has coverage with them as of 10/08/17.  Called patient and LMOVM advising of the above and to provide [place] with insurance that is active.

## 2017-12-03 ENCOUNTER — Other Ambulatory Visit: Payer: Self-pay | Admitting: Nurse Practitioner

## 2017-12-03 DIAGNOSIS — F3341 Major depressive disorder, recurrent, in partial remission: Secondary | ICD-10-CM

## 2017-12-03 NOTE — Assessment & Plan Note (Signed)
Vague, also notes food "backing up in chest", no PPI. Start Protonix once daily. Possible dilation if obvious stricture, ring.

## 2017-12-03 NOTE — Assessment & Plan Note (Signed)
No prior colonoscopy. Constipation improved with avoidance of cheese/sweets. Call if persistent despite dietary/behavior modifications.  Proceed with colonoscopy with Dr. Oneida Alar in the near future. The risks, benefits, and alternatives have been discussed in detail with the patient. They state understanding and desire to proceed.  Propofol due to polypharmacy.

## 2017-12-03 NOTE — Assessment & Plan Note (Signed)
62 year old female with nausea, early satiety, bloating, weight loss documented but now actually gaining weight, likely multifactorial in setting of diet/behavior, uncontrolled GERD, possibly constipation even playing a role as well. No prior EGD. Vague sensation of food "backing up" in chest, vague dysphagia.   Will pursue EGD +/- dilation with Dr. Oneida Alar in future. Risks and benefits discussed with stated understanding. Propofol due to polypharmacy. Stop Zantac. Start Protonix. Sent to pharmacy Avoid cheese/sweets, as these worsen symptoms Return in follow-up after procedures.

## 2017-12-04 NOTE — Progress Notes (Signed)
CC'D TO PCP °

## 2017-12-07 ENCOUNTER — Telehealth: Payer: Self-pay | Admitting: Family Medicine

## 2017-12-07 NOTE — Telephone Encounter (Signed)
Discussed FMLA paperwork with patient.  She continues to have quite a bit of generalized abdominal pain, bloating and diarrheal episodes.  There is plan for EGD with Dr. Oneida Alar on 01/22/2018, as well as colonoscopy.  She no longer is losing weight, which is reassuring.  She continues to have early satiety and difficulty eating.  She feels like abdominal pain is worsened by standing positions and does not feel like she is able to perform her job due to this.  I have completed her paperwork.  I have asked her to follow-up as scheduled.  Erin Hale M. Lajuana Ripple, [facility]

## 2017-12-24 ENCOUNTER — Ambulatory Visit: Payer: PRIVATE HEALTH INSURANCE | Admitting: Family Medicine

## 2017-12-26 ENCOUNTER — Telehealth: Payer: Self-pay | Admitting: Family Medicine

## 2017-12-27 NOTE — Telephone Encounter (Signed)
LMOVM paperwork was just faxed, it had been scanned into the system. Called her work number as well but she is not working today. Paperwork is filed under media dated 11/20/17 labeled Letter/Attach [place].

## 2018-01-04 ENCOUNTER — Other Ambulatory Visit: Payer: Self-pay | Admitting: Family Medicine

## 2018-01-04 ENCOUNTER — Other Ambulatory Visit: Payer: Self-pay | Admitting: Nurse Practitioner

## 2018-01-04 DIAGNOSIS — F3342 Major depressive disorder, recurrent, in full remission: Secondary | ICD-10-CM

## 2018-01-04 DIAGNOSIS — F3341 Major depressive disorder, recurrent, in partial remission: Secondary | ICD-10-CM

## 2018-01-04 NOTE — Telephone Encounter (Signed)
Last seen 09/17/17

## 2018-01-04 NOTE — Telephone Encounter (Signed)
Last seen 09/17/17  MMM

## 2018-01-06 ENCOUNTER — Other Ambulatory Visit: Payer: Self-pay | Admitting: Family Medicine

## 2018-01-06 DIAGNOSIS — F3341 Major depressive disorder, recurrent, in partial remission: Secondary | ICD-10-CM

## 2018-01-09 NOTE — Patient Instructions (Signed)
Erin Hale  01/09/2018     @PREFPERIOPPHARMACY @   Your procedure is scheduled on  03/24/2017 .  Report to [facility] at  1200   P.M.  Call this number if you have problems the morning of surgery:  [phone]   Remember:  Follow the diet and prep instructions given to you by Dr Nona Dell office.                   Take these medicines the morning of surgery with A SIP OF WATER wellbutrin, clonazepam, lexapro, levothyroxine, protonix, zantac. Use your  Inhaler before you come.    Do not wear jewelry, make-up or nail polish.  Do not wear lotions, powders, or perfumes, or deodorant.  Do not shave 48 hours prior to surgery.  Men may shave face and neck.  Do not bring valuables to the hospital.  [facility] is not responsible for any belongings or valuables.  Contacts, dentures or bridgework may not be worn into surgery.  Leave your suitcase in the car.  After surgery it may be brought to your room.  For patients admitted to the hospital, discharge time will be determined by your treatment team.  Patients discharged the day of surgery will not be allowed to drive home.   Name and phone number of your driver:   family Special instructions:  None  Please read over the following fact sheets that you were given. Anesthesia Post-op Instructions and Care and Recovery After Surgery       Esophagogastroduodenoscopy Esophagogastroduodenoscopy (EGD) is a procedure to examine the lining of the esophagus, stomach, and first part of the small intestine (duodenum). This procedure is done to check for problems such as inflammation, bleeding, ulcers, or growths. During this procedure, a long, flexible, lighted tube with a camera attached (endoscope) is inserted down the throat. Tell a health care provider about:  Any allergies you have.  All medicines you are taking, including vitamins, herbs, eye drops, creams, and over-the-counter medicines.  Any problems  you or family members have had with anesthetic medicines.  Any blood disorders you have.  Any surgeries you have had.  Any medical conditions you have.  Whether you are pregnant or may be pregnant. What are the risks? Generally, this is a safe procedure. However, problems may occur, including:  Infection.  Bleeding.  A tear (perforation) in the esophagus, stomach, or duodenum.  Trouble breathing.  Excessive sweating.  Spasms of the larynx.  A slowed heartbeat.  Low blood pressure.  What happens before the procedure?  Follow instructions from your health care provider about eating or drinking restrictions.  Ask your health care provider about: ? Changing or stopping your regular medicines. This is especially important if you are taking diabetes medicines or blood thinners. ? Taking medicines such as aspirin and ibuprofen. These medicines can thin your blood. Do not take these medicines before your procedure if your health care provider instructs you not to.  Plan to have someone take you home after the procedure.  If you wear dentures, be ready to remove them before the procedure. What happens during the procedure?  To reduce your risk of infection, your health care team will wash or sanitize their hands.  An IV tube will be put in a vein in your hand or arm. You will get medicines and fluids through this tube.  You will be given one  or more of the following: ? A medicine to help you relax (sedative). ? A medicine to numb the area (local anesthetic). This medicine may be sprayed into your throat. It will make you feel more comfortable and keep you from gagging or coughing during the procedure. ? A medicine for pain.  A mouth guard may be placed in your mouth to protect your teeth and to keep you from biting on the endoscope.  You will be asked to lie on your left side.  The endoscope will be lowered down your throat into your esophagus, stomach, and  duodenum.  Air will be put into the endoscope. This will help your health care provider see better.  The lining of your esophagus, stomach, and duodenum will be examined.  Your health care provider may: ? Take a tissue sample so it can be looked at in a lab (biopsy). ? Remove growths. ? Remove objects (foreign bodies) that are stuck. ? Treat any bleeding with medicines or other devices that stop tissue from bleeding. ? Widen (dilate) or stretch narrowed areas of your esophagus and stomach.  The endoscope will be taken out. The procedure may vary among health care providers and hospitals. What happens after the procedure?  Your blood pressure, heart rate, breathing rate, and blood oxygen level will be monitored often until the medicines you were given have worn off.  Do not eat or drink anything until the numbing medicine has worn off and your gag reflex has returned. This information is not intended to replace advice given to you by your health care provider. Make sure you discuss any questions you have with your health care provider. Document Released: 06/23/2004 Document Revised: 07/29/2015 Document Reviewed: 01/14/2015 Elsevier Interactive Patient Education  2018 [employer]. Esophagogastroduodenoscopy, Care After Refer to this sheet in the next few weeks. These instructions provide you with information about caring for yourself after your procedure. Your health care provider may also give you more specific instructions. Your treatment has been planned according to current medical practices, but problems sometimes occur. Call your health care provider if you have any problems or questions after your procedure. What can I expect after the procedure? After the procedure, it is common to have:  A sore throat.  Nausea.  Bloating.  Dizziness.  Fatigue.  Follow these instructions at home:  Do not eat or drink anything until the numbing medicine (local anesthetic) has worn off  and your gag reflex has returned. You will know that the local anesthetic has worn off when you can swallow comfortably.  Do not drive for 24 hours if you received a medicine to help you relax (sedative).  If your health care provider took a tissue sample for testing during the procedure, make sure to get your test results. This is your responsibility. Ask your health care provider or the department performing the test when your results will be ready.  Keep all follow-up visits as told by your health care provider. This is important. Contact a health care provider if:  You cannot stop coughing.  You are not urinating.  You are urinating less than usual. Get help right away if:  You have trouble swallowing.  You cannot eat or drink.  You have throat or chest pain that gets worse.  You are dizzy or light-headed.  You faint.  You have nausea or vomiting.  You have chills.  You have a fever.  You have severe abdominal pain.  You have black, tarry, or bloody stools.  This information is not intended to replace advice given to you by your health care provider. Make sure you discuss any questions you have with your health care provider. Document Released: 02/07/2012 Document Revised: 07/29/2015 Document Reviewed: 01/14/2015 Elsevier Interactive Patient Education  2018 [employer].  Esophageal Dilatation Esophageal dilatation is a procedure to open a blocked or narrowed part of the esophagus. The esophagus is the long tube in your throat that carries food and liquid from your mouth to your stomach. The procedure is also called esophageal dilation. You may need this procedure if you have a buildup of scar tissue in your esophagus that makes it difficult, painful, or even impossible to swallow. This can be caused by gastroesophageal reflux disease (GERD). In rare cases, people need this procedure because they have cancer of the esophagus or a problem with the way food moves through  the esophagus. Sometimes you may need to have another dilatation to enlarge the opening of the esophagus gradually. Tell a health care provider about:  Any allergies you have.  All medicines you are taking, including vitamins, herbs, eye drops, creams, and over-the-counter medicines.  Any problems you or family members have had with anesthetic medicines.  Any blood disorders you have.  Any surgeries you have had.  Any medical conditions you have.  Any antibiotic medicines you are required to take before dental procedures. What are the risks? Generally, this is a safe procedure. However, problems can occur and include:  Bleeding from a tear in the lining of the esophagus.  A hole (perforation) in the esophagus.  What happens before the procedure?  Do not eat or drink anything after midnight on the night before the procedure or as directed by your health care provider.  Ask your health care provider about changing or stopping your regular medicines. This is especially important if you are taking diabetes medicines or blood thinners.  Plan to have someone take you home after the procedure. What happens during the procedure?  You will be given a medicine that makes you relaxed and sleepy (sedative).  A medicine may be sprayed or gargled to numb the back of the throat.  Your health care provider can use various instruments to do an esophageal dilatation. During the procedure, the instrument used will be placed in your mouth and passed down into your esophagus. Options include: ? Simple dilators. This instrument is carefully placed in the esophagus to stretch it. ? Guided wire bougies. In this method, a flexible tube (endoscope) is used to insert a wire into the esophagus. The dilator is passed over this wire to enlarge the esophagus. Then the wire is removed. ? Balloon dilators. An endoscope with a small balloon at the end is passed down into the esophagus. Inflating the balloon  gently stretches the esophagus and opens it up. What happens after the procedure?  Your blood pressure, heart rate, breathing rate, and blood oxygen level will be monitored often until the medicines you were given have worn off.  Your throat may feel slightly sore and will probably still feel numb. This will improve slowly over time.  You will not be allowed to eat or drink until the throat numbness has resolved.  If this is a same-day procedure, you may be allowed to go home once you have been able to drink, urinate, and sit on the edge of the bed without nausea or dizziness.  If this is a same-day procedure, you should have a friend or family member with you  for the next 24 hours after the procedure. This information is not intended to replace advice given to you by your health care provider. Make sure you discuss any questions you have with your health care provider. Document Released: 04/13/2005 Document Revised: 07/29/2015 Document Reviewed: 07/02/2013 Elsevier Interactive Patient Education  [employer].  Colonoscopy, Adult A colonoscopy is an exam to look at the large intestine. It is done to check for problems, such as:  Lumps (tumors).  Growths (polyps).  Swelling (inflammation).  Bleeding.  What happens before the procedure? Eating and drinking Follow instructions from your doctor about eating and drinking. These instructions may include:  A few days before the procedure - follow a low-fiber diet. ? Avoid nuts. ? Avoid seeds. ? Avoid dried fruit. ? Avoid raw fruits. ? Avoid vegetables.  1-3 days before the procedure - follow a clear liquid diet. Avoid liquids that have red or purple dye. Drink only clear liquids, such as: ? Clear broth or bouillon. ? Black coffee or tea. ? Clear juice. ? Clear soft drinks or sports drinks. ? Gelatin dessert. ? Popsicles.  On the day of the procedure - do not eat or drink anything during the 2 hours before the  procedure.  Bowel prep If you were prescribed an oral bowel prep:  Take it as told by your doctor. Starting the day before your procedure, you will need to drink a lot of liquid. The liquid will cause you to poop (have bowel movements) until your poop is almost clear or light green.  If your skin or butt gets irritated from diarrhea, you may: ? Wipe the area with wipes that have medicine in them, such as adult wet wipes with aloe and vitamin E. ? Put something on your skin that soothes the area, such as petroleum jelly.  If you throw up (vomit) while drinking the bowel prep, take a break for up to 60 minutes. Then begin the bowel prep again. If you keep throwing up and you cannot take the bowel prep without throwing up, call your doctor.  General instructions  Ask your doctor about changing or stopping your normal medicines. This is important if you take diabetes medicines or blood thinners.  Plan to have someone take you home from the hospital or clinic. What happens during the procedure?  An IV tube may be put into one of your veins.  You will be given medicine to help you relax (sedative).  To reduce your risk of infection: ? Your doctors will wash their hands. ? Your anal area will be washed with soap.  You will be asked to lie on your side with your knees bent.  Your doctor will get a long, thin, flexible tube ready. The tube will have a camera and a light on the end.  The tube will be put into your anus.  The tube will be gently put into your large intestine.  Air will be delivered into your large intestine to keep it open. You may feel some pressure or cramping.  The camera will be used to take photos.  A small tissue sample may be removed from your body to be looked at under a microscope (biopsy). If any possible problems are found, the tissue will be sent to a lab for testing.  If small growths are found, your doctor may remove them and have them checked for  cancer.  The tube that was put into your anus will be slowly removed. The procedure may vary  among doctors and hospitals. What happens after the procedure?  Your doctor will check on you often until the medicines you were given have worn off.  Do not drive for 24 hours after the procedure.  You may have a small amount of blood in your poop.  You may pass gas.  You may have mild cramps or bloating in your belly (abdomen).  It is up to you to get the results of your procedure. Ask your doctor, or the department performing the procedure, when your results will be ready. This information is not intended to replace advice given to you by your health care provider. Make sure you discuss any questions you have with your health care provider. Document Released: 03/25/2010 Document Revised: 12/22/2015 Document Reviewed: 05/04/2015 Elsevier Interactive Patient Education  2017 [employer]  Colonoscopy, Adult, Care After This sheet gives you information about how to care for yourself after your procedure. Your health care provider may also give you more specific instructions. If you have problems or questions, contact your health care provider. What can I expect after the procedure? After the procedure, it is common to have:  A small amount of blood in your stool for 24 hours after the procedure.  Some gas.  Mild abdominal cramping or bloating.  Follow these instructions at home: General instructions   For the first 24 hours after the procedure: ? Do not drive or use machinery. ? Do not sign important documents. ? Do not drink alcohol. ? Do your regular daily activities at a slower pace than normal. ? Eat soft, easy-to-digest foods. ? Rest often.  Take over-the-counter or prescription medicines only as told by your health care provider.  It is up to you to get the results of your procedure. Ask your health care provider, or the department performing the procedure, when your  results will be ready. Relieving cramping and bloating  Try walking around when you have cramps or feel bloated.  Apply heat to your abdomen as told by your health care provider. Use a heat source that your health care provider recommends, such as a moist heat pack or a heating pad. ? Place a towel between your skin and the heat source. ? Leave the heat on for 20-30 minutes. ? Remove the heat if your skin turns bright red. This is especially important if you are unable to feel pain, heat, or cold. You may have a greater risk of getting burned. Eating and drinking  Drink enough fluid to keep your urine clear or pale yellow.  Resume your normal diet as instructed by your health care provider. Avoid heavy or fried foods that are hard to digest.  Avoid drinking alcohol for as long as instructed by your health care provider. Contact a health care provider if:  You have blood in your stool 2-3 days after the procedure. Get help right away if:  You have more than a small spotting of blood in your stool.  You pass large blood clots in your stool.  Your abdomen is swollen.  You have nausea or vomiting.  You have a fever.  You have increasing abdominal pain that is not relieved with medicine. This information is not intended to replace advice given to you by your health care provider. Make sure you discuss any questions you have with your health care provider. Document Released: 10/05/2003 Document Revised: 11/15/2015 Document Reviewed: 05/04/2015 Elsevier Interactive Patient Education  2018 [place] Anesthesia is a term that refers  to techniques, procedures, and medicines that help a person stay safe and comfortable during a medical procedure. Monitored anesthesia care, or sedation, is one type of anesthesia. Your anesthesia specialist may recommend sedation if you will be having a procedure that does not require you to be unconscious, such as:  Cataract  surgery.  A dental procedure.  A biopsy.  A colonoscopy.  During the procedure, you may receive a medicine to help you relax (sedative). There are three levels of sedation:  Mild sedation. At this level, you may feel awake and relaxed. You will be able to follow directions.  Moderate sedation. At this level, you will be sleepy. You may not remember the procedure.  Deep sedation. At this level, you will be asleep. You will not remember the procedure.  The more medicine you are given, the deeper your level of sedation will be. Depending on how you respond to the procedure, the anesthesia specialist may change your level of sedation or the type of anesthesia to fit your needs. An anesthesia specialist will monitor you closely during the procedure. Let your health care provider know about:  Any allergies you have.  All medicines you are taking, including vitamins, herbs, eye drops, creams, and over-the-counter medicines.  Any use of steroids (by mouth or as a cream).  Any problems you or family members have had with sedatives and anesthetic medicines.  Any blood disorders you have.  Any surgeries you have had.  Any medical conditions you have, such as sleep apnea.  Whether you are pregnant or may be pregnant.  Any use of cigarettes, alcohol, or street drugs. What are the risks? Generally, this is a safe procedure. However, problems may occur, including:  Getting too much medicine (oversedation).  Nausea.  Allergic reaction to medicines.  Trouble breathing. If this happens, a breathing tube may be used to help with breathing. It will be removed when you are awake and breathing on your own.  Heart trouble.  Lung trouble.  Before the procedure Staying hydrated Follow instructions from your health care provider about hydration, which may include:  Up to 2 hours before the procedure - you may continue to drink clear liquids, such as water, clear fruit juice, black  coffee, and plain tea.  Eating and drinking restrictions Follow instructions from your health care provider about eating and drinking, which may include:  8 hours before the procedure - stop eating heavy meals or foods such as meat, fried foods, or fatty foods.  6 hours before the procedure - stop eating light meals or foods, such as toast or cereal.  6 hours before the procedure - stop drinking milk or drinks that contain milk.  2 hours before the procedure - stop drinking clear liquids.  Medicines Ask your health care provider about:  Changing or stopping your regular medicines. This is especially important if you are taking diabetes medicines or blood thinners.  Taking medicines such as aspirin and ibuprofen. These medicines can thin your blood. Do not take these medicines before your procedure if your health care provider instructs you not to.  Tests and exams  You will have a physical exam.  You may have blood tests done to show: ? How well your kidneys and liver are working. ? How well your blood can clot.  General instructions  Plan to have someone take you home from the hospital or clinic.  If you will be going home right after the procedure, plan to have someone with you for  24 hours.  What happens during the procedure?  Your blood pressure, heart rate, breathing, level of pain and overall condition will be monitored.  An IV tube will be inserted into one of your veins.  Your anesthesia specialist will give you medicines as needed to keep you comfortable during the procedure. This may mean changing the level of sedation.  The procedure will be performed. After the procedure  Your blood pressure, heart rate, breathing rate, and blood oxygen level will be monitored until the medicines you were given have worn off.  Do not drive for 24 hours if you received a sedative.  You may: ? Feel sleepy, clumsy, or nauseous. ? Feel forgetful about what happened after the  procedure. ? Have a sore throat if you had a breathing tube during the procedure. ? Vomit. This information is not intended to replace advice given to you by your health care provider. Make sure you discuss any questions you have with your health care provider. Document Released: 11/16/2004 Document Revised: 07/30/2015 Document Reviewed: 06/13/2015 Elsevier Interactive Patient Education  2018 [place], Care After These instructions provide you with information about caring for yourself after your procedure. Your health care provider may also give you more specific instructions. Your treatment has been planned according to current medical practices, but problems sometimes occur. Call your health care provider if you have any problems or questions after your procedure. What can I expect after the procedure? After your procedure, it is common to:  Feel sleepy for several hours.  Feel clumsy and have poor balance for several hours.  Feel forgetful about what happened after the procedure.  Have poor judgment for several hours.  Feel nauseous or vomit.  Have a sore throat if you had a breathing tube during the procedure.  Follow these instructions at home: For at least 24 hours after the procedure:   Do not: ? Participate in activities in which you could fall or become injured. ? Drive. ? Use heavy machinery. ? Drink alcohol. ? Take sleeping pills or medicines that cause drowsiness. ? Make important decisions or sign legal documents. ? Take care of children on your own.  Rest. Eating and drinking  Follow the diet that is recommended by your health care provider.  If you vomit, drink water, juice, or soup when you can drink without vomiting.  Make sure you have little or no nausea before eating solid foods. General instructions  Have a responsible adult stay with you until you are awake and alert.  Take over-the-counter and prescription  medicines only as told by your health care provider.  If you smoke, do not smoke without supervision.  Keep all follow-up visits as told by your health care provider. This is important. Contact a health care provider if:  You keep feeling nauseous or you keep vomiting.  You feel light-headed.  You develop a rash.  You have a fever. Get help right away if:  You have trouble breathing. This information is not intended to replace advice given to you by your health care provider. Make sure you discuss any questions you have with your health care provider. Document Released: 06/13/2015 Document Revised: 10/13/2015 Document Reviewed: 06/13/2015 Elsevier Interactive Patient Education  [employer].

## 2018-01-15 ENCOUNTER — Encounter (HOSPITAL_COMMUNITY)
Admission: RE | Admit: 2018-01-15 | Discharge: 2018-01-15 | Disposition: A | Discharge status: Home / Self Care | Payer: No Typology Code available for payment source | Source: Ambulatory Visit | Attending: Gastroenterology | Admitting: Gastroenterology

## 2018-01-16 NOTE — Patient Instructions (Signed)
Erin Hale  01/16/2018     @PREFPERIOPPHARMACY @   Your procedure is scheduled on  01/22/2018 .  Report to [facility] at  1145   A.M.  Call this number if you have problems the morning of surgery:  [phone]   Remember:  Follow the diet and prep instructions given to you by Dr Nona Dell office.                       Take these medicines the morning of surgery with A SIP OF WATER  Wellbutrin, clonazepam, lexapro, levotyroxine, protonix, zantac.    Do not wear jewelry, make-up or nail polish.  Do not wear lotions, powders, or perfumes, or deodorant.  Do not shave 48 hours prior to surgery.  Men may shave face and neck.  Do not bring valuables to the hospital.  [facility] is not responsible for any belongings or valuables.  Contacts, dentures or bridgework may not be worn into surgery.  Leave your suitcase in the car.  After surgery it may be brought to your room.  For patients admitted to the hospital, discharge time will be determined by your treatment team.  Patients discharged the day of surgery will not be allowed to drive home.   Name and phone number of your driver:   family Special instructions:  None  Please read over the following fact sheets that you were given. Anesthesia Post-op Instructions and Care and Recovery After Surgery       Esophagogastroduodenoscopy Esophagogastroduodenoscopy (EGD) is a procedure to examine the lining of the esophagus, stomach, and first part of the small intestine (duodenum). This procedure is done to check for problems such as inflammation, bleeding, ulcers, or growths. During this procedure, a long, flexible, lighted tube with a camera attached (endoscope) is inserted down the throat. Tell a health care provider about:  Any allergies you have.  All medicines you are taking, including vitamins, herbs, eye drops, creams, and over-the-counter medicines.  Any problems you or family members have had  with anesthetic medicines.  Any blood disorders you have.  Any surgeries you have had.  Any medical conditions you have.  Whether you are pregnant or may be pregnant. What are the risks? Generally, this is a safe procedure. However, problems may occur, including:  Infection.  Bleeding.  A tear (perforation) in the esophagus, stomach, or duodenum.  Trouble breathing.  Excessive sweating.  Spasms of the larynx.  A slowed heartbeat.  Low blood pressure.  What happens before the procedure?  Follow instructions from your health care provider about eating or drinking restrictions.  Ask your health care provider about: ? Changing or stopping your regular medicines. This is especially important if you are taking diabetes medicines or blood thinners. ? Taking medicines such as aspirin and ibuprofen. These medicines can thin your blood. Do not take these medicines before your procedure if your health care provider instructs you not to.  Plan to have someone take you home after the procedure.  If you wear dentures, be ready to remove them before the procedure. What happens during the procedure?  To reduce your risk of infection, your health care team will wash or sanitize their hands.  An IV tube will be put in a vein in your hand or arm. You will get medicines and fluids through this tube.  You will be given one or more  of the following: ? A medicine to help you relax (sedative). ? A medicine to numb the area (local anesthetic). This medicine may be sprayed into your throat. It will make you feel more comfortable and keep you from gagging or coughing during the procedure. ? A medicine for pain.  A mouth guard may be placed in your mouth to protect your teeth and to keep you from biting on the endoscope.  You will be asked to lie on your left side.  The endoscope will be lowered down your throat into your esophagus, stomach, and duodenum.  Air will be put into the  endoscope. This will help your health care provider see better.  The lining of your esophagus, stomach, and duodenum will be examined.  Your health care provider may: ? Take a tissue sample so it can be looked at in a lab (biopsy). ? Remove growths. ? Remove objects (foreign bodies) that are stuck. ? Treat any bleeding with medicines or other devices that stop tissue from bleeding. ? Widen (dilate) or stretch narrowed areas of your esophagus and stomach.  The endoscope will be taken out. The procedure may vary among health care providers and hospitals. What happens after the procedure?  Your blood pressure, heart rate, breathing rate, and blood oxygen level will be monitored often until the medicines you were given have worn off.  Do not eat or drink anything until the numbing medicine has worn off and your gag reflex has returned. This information is not intended to replace advice given to you by your health care provider. Make sure you discuss any questions you have with your health care provider. Document Released: 06/23/2004 Document Revised: 07/29/2015 Document Reviewed: 01/14/2015 Elsevier Interactive Patient Education  2018 [employer]. Esophagogastroduodenoscopy, Care After Refer to this sheet in the next few weeks. These instructions provide you with information about caring for yourself after your procedure. Your health care provider may also give you more specific instructions. Your treatment has been planned according to current medical practices, but problems sometimes occur. Call your health care provider if you have any problems or questions after your procedure. What can I expect after the procedure? After the procedure, it is common to have:  A sore throat.  Nausea.  Bloating.  Dizziness.  Fatigue.  Follow these instructions at home:  Do not eat or drink anything until the numbing medicine (local anesthetic) has worn off and your gag reflex has returned. You  will know that the local anesthetic has worn off when you can swallow comfortably.  Do not drive for 24 hours if you received a medicine to help you relax (sedative).  If your health care provider took a tissue sample for testing during the procedure, make sure to get your test results. This is your responsibility. Ask your health care provider or the department performing the test when your results will be ready.  Keep all follow-up visits as told by your health care provider. This is important. Contact a health care provider if:  You cannot stop coughing.  You are not urinating.  You are urinating less than usual. Get help right away if:  You have trouble swallowing.  You cannot eat or drink.  You have throat or chest pain that gets worse.  You are dizzy or light-headed.  You faint.  You have nausea or vomiting.  You have chills.  You have a fever.  You have severe abdominal pain.  You have black, tarry, or bloody stools. This information  is not intended to replace advice given to you by your health care provider. Make sure you discuss any questions you have with your health care provider. Document Released: 02/07/2012 Document Revised: 07/29/2015 Document Reviewed: 01/14/2015 Elsevier Interactive Patient Education  2018 [employer].  Esophageal Dilatation Esophageal dilatation is a procedure to open a blocked or narrowed part of the esophagus. The esophagus is the long tube in your throat that carries food and liquid from your mouth to your stomach. The procedure is also called esophageal dilation. You may need this procedure if you have a buildup of scar tissue in your esophagus that makes it difficult, painful, or even impossible to swallow. This can be caused by gastroesophageal reflux disease (GERD). In rare cases, people need this procedure because they have cancer of the esophagus or a problem with the way food moves through the esophagus. Sometimes you may need to  have another dilatation to enlarge the opening of the esophagus gradually. Tell a health care provider about:  Any allergies you have.  All medicines you are taking, including vitamins, herbs, eye drops, creams, and over-the-counter medicines.  Any problems you or family members have had with anesthetic medicines.  Any blood disorders you have.  Any surgeries you have had.  Any medical conditions you have.  Any antibiotic medicines you are required to take before dental procedures. What are the risks? Generally, this is a safe procedure. However, problems can occur and include:  Bleeding from a tear in the lining of the esophagus.  A hole (perforation) in the esophagus.  What happens before the procedure?  Do not eat or drink anything after midnight on the night before the procedure or as directed by your health care provider.  Ask your health care provider about changing or stopping your regular medicines. This is especially important if you are taking diabetes medicines or blood thinners.  Plan to have someone take you home after the procedure. What happens during the procedure?  You will be given a medicine that makes you relaxed and sleepy (sedative).  A medicine may be sprayed or gargled to numb the back of the throat.  Your health care provider can use various instruments to do an esophageal dilatation. During the procedure, the instrument used will be placed in your mouth and passed down into your esophagus. Options include: ? Simple dilators. This instrument is carefully placed in the esophagus to stretch it. ? Guided wire bougies. In this method, a flexible tube (endoscope) is used to insert a wire into the esophagus. The dilator is passed over this wire to enlarge the esophagus. Then the wire is removed. ? Balloon dilators. An endoscope with a small balloon at the end is passed down into the esophagus. Inflating the balloon gently stretches the esophagus and opens it  up. What happens after the procedure?  Your blood pressure, heart rate, breathing rate, and blood oxygen level will be monitored often until the medicines you were given have worn off.  Your throat may feel slightly sore and will probably still feel numb. This will improve slowly over time.  You will not be allowed to eat or drink until the throat numbness has resolved.  If this is a same-day procedure, you may be allowed to go home once you have been able to drink, urinate, and sit on the edge of the bed without nausea or dizziness.  If this is a same-day procedure, you should have a friend or family member with you for the  next 24 hours after the procedure. This information is not intended to replace advice given to you by your health care provider. Make sure you discuss any questions you have with your health care provider. Document Released: 04/13/2005 Document Revised: 07/29/2015 Document Reviewed: 07/02/2013 Elsevier Interactive Patient Education  [employer].  Colonoscopy, Adult A colonoscopy is an exam to look at the large intestine. It is done to check for problems, such as:  Lumps (tumors).  Growths (polyps).  Swelling (inflammation).  Bleeding.  What happens before the procedure? Eating and drinking Follow instructions from your doctor about eating and drinking. These instructions may include:  A few days before the procedure - follow a low-fiber diet. ? Avoid nuts. ? Avoid seeds. ? Avoid dried fruit. ? Avoid raw fruits. ? Avoid vegetables.  1-3 days before the procedure - follow a clear liquid diet. Avoid liquids that have red or purple dye. Drink only clear liquids, such as: ? Clear broth or bouillon. ? Black coffee or tea. ? Clear juice. ? Clear soft drinks or sports drinks. ? Gelatin dessert. ? Popsicles.  On the day of the procedure - do not eat or drink anything during the 2 hours before the procedure.  Bowel prep If you were prescribed an oral  bowel prep:  Take it as told by your doctor. Starting the day before your procedure, you will need to drink a lot of liquid. The liquid will cause you to poop (have bowel movements) until your poop is almost clear or light green.  If your skin or butt gets irritated from diarrhea, you may: ? Wipe the area with wipes that have medicine in them, such as adult wet wipes with aloe and vitamin E. ? Put something on your skin that soothes the area, such as petroleum jelly.  If you throw up (vomit) while drinking the bowel prep, take a break for up to 60 minutes. Then begin the bowel prep again. If you keep throwing up and you cannot take the bowel prep without throwing up, call your doctor.  General instructions  Ask your doctor about changing or stopping your normal medicines. This is important if you take diabetes medicines or blood thinners.  Plan to have someone take you home from the hospital or clinic. What happens during the procedure?  An IV tube may be put into one of your veins.  You will be given medicine to help you relax (sedative).  To reduce your risk of infection: ? Your doctors will wash their hands. ? Your anal area will be washed with soap.  You will be asked to lie on your side with your knees bent.  Your doctor will get a long, thin, flexible tube ready. The tube will have a camera and a light on the end.  The tube will be put into your anus.  The tube will be gently put into your large intestine.  Air will be delivered into your large intestine to keep it open. You may feel some pressure or cramping.  The camera will be used to take photos.  A small tissue sample may be removed from your body to be looked at under a microscope (biopsy). If any possible problems are found, the tissue will be sent to a lab for testing.  If small growths are found, your doctor may remove them and have them checked for cancer.  The tube that was put into your anus will be slowly  removed. The procedure may vary among doctors  and hospitals. What happens after the procedure?  Your doctor will check on you often until the medicines you were given have worn off.  Do not drive for 24 hours after the procedure.  You may have a small amount of blood in your poop.  You may pass gas.  You may have mild cramps or bloating in your belly (abdomen).  It is up to you to get the results of your procedure. Ask your doctor, or the department performing the procedure, when your results will be ready. This information is not intended to replace advice given to you by your health care provider. Make sure you discuss any questions you have with your health care provider. Document Released: 03/25/2010 Document Revised: 12/22/2015 Document Reviewed: 05/04/2015 Elsevier Interactive Patient Education  2017 [employer]  Colonoscopy, Adult, Care After This sheet gives you information about how to care for yourself after your procedure. Your health care provider may also give you more specific instructions. If you have problems or questions, contact your health care provider. What can I expect after the procedure? After the procedure, it is common to have:  A small amount of blood in your stool for 24 hours after the procedure.  Some gas.  Mild abdominal cramping or bloating.  Follow these instructions at home: General instructions   For the first 24 hours after the procedure: ? Do not drive or use machinery. ? Do not sign important documents. ? Do not drink alcohol. ? Do your regular daily activities at a slower pace than normal. ? Eat soft, easy-to-digest foods. ? Rest often.  Take over-the-counter or prescription medicines only as told by your health care provider.  It is up to you to get the results of your procedure. Ask your health care provider, or the department performing the procedure, when your results will be ready. Relieving cramping and bloating  Try  walking around when you have cramps or feel bloated.  Apply heat to your abdomen as told by your health care provider. Use a heat source that your health care provider recommends, such as a moist heat pack or a heating pad. ? Place a towel between your skin and the heat source. ? Leave the heat on for 20-30 minutes. ? Remove the heat if your skin turns bright red. This is especially important if you are unable to feel pain, heat, or cold. You may have a greater risk of getting burned. Eating and drinking  Drink enough fluid to keep your urine clear or pale yellow.  Resume your normal diet as instructed by your health care provider. Avoid heavy or fried foods that are hard to digest.  Avoid drinking alcohol for as long as instructed by your health care provider. Contact a health care provider if:  You have blood in your stool 2-3 days after the procedure. Get help right away if:  You have more than a small spotting of blood in your stool.  You pass large blood clots in your stool.  Your abdomen is swollen.  You have nausea or vomiting.  You have a fever.  You have increasing abdominal pain that is not relieved with medicine. This information is not intended to replace advice given to you by your health care provider. Make sure you discuss any questions you have with your health care provider. Document Released: 10/05/2003 Document Revised: 11/15/2015 Document Reviewed: 05/04/2015 Elsevier Interactive Patient Education  2018 [place] Anesthesia is a term that refers to techniques,  procedures, and medicines that help a person stay safe and comfortable during a medical procedure. Monitored anesthesia care, or sedation, is one type of anesthesia. Your anesthesia specialist may recommend sedation if you will be having a procedure that does not require you to be unconscious, such as:  Cataract surgery.  A dental procedure.  A biopsy.  A  colonoscopy.  During the procedure, you may receive a medicine to help you relax (sedative). There are three levels of sedation:  Mild sedation. At this level, you may feel awake and relaxed. You will be able to follow directions.  Moderate sedation. At this level, you will be sleepy. You may not remember the procedure.  Deep sedation. At this level, you will be asleep. You will not remember the procedure.  The more medicine you are given, the deeper your level of sedation will be. Depending on how you respond to the procedure, the anesthesia specialist may change your level of sedation or the type of anesthesia to fit your needs. An anesthesia specialist will monitor you closely during the procedure. Let your health care provider know about:  Any allergies you have.  All medicines you are taking, including vitamins, herbs, eye drops, creams, and over-the-counter medicines.  Any use of steroids (by mouth or as a cream).  Any problems you or family members have had with sedatives and anesthetic medicines.  Any blood disorders you have.  Any surgeries you have had.  Any medical conditions you have, such as sleep apnea.  Whether you are pregnant or may be pregnant.  Any use of cigarettes, alcohol, or street drugs. What are the risks? Generally, this is a safe procedure. However, problems may occur, including:  Getting too much medicine (oversedation).  Nausea.  Allergic reaction to medicines.  Trouble breathing. If this happens, a breathing tube may be used to help with breathing. It will be removed when you are awake and breathing on your own.  Heart trouble.  Lung trouble.  Before the procedure Staying hydrated Follow instructions from your health care provider about hydration, which may include:  Up to 2 hours before the procedure - you may continue to drink clear liquids, such as water, clear fruit juice, black coffee, and plain tea.  Eating and drinking  restrictions Follow instructions from your health care provider about eating and drinking, which may include:  8 hours before the procedure - stop eating heavy meals or foods such as meat, fried foods, or fatty foods.  6 hours before the procedure - stop eating light meals or foods, such as toast or cereal.  6 hours before the procedure - stop drinking milk or drinks that contain milk.  2 hours before the procedure - stop drinking clear liquids.  Medicines Ask your health care provider about:  Changing or stopping your regular medicines. This is especially important if you are taking diabetes medicines or blood thinners.  Taking medicines such as aspirin and ibuprofen. These medicines can thin your blood. Do not take these medicines before your procedure if your health care provider instructs you not to.  Tests and exams  You will have a physical exam.  You may have blood tests done to show: ? How well your kidneys and liver are working. ? How well your blood can clot.  General instructions  Plan to have someone take you home from the hospital or clinic.  If you will be going home right after the procedure, plan to have someone with you for 24 hours.  What happens during the procedure?  Your blood pressure, heart rate, breathing, level of pain and overall condition will be monitored.  An IV tube will be inserted into one of your veins.  Your anesthesia specialist will give you medicines as needed to keep you comfortable during the procedure. This may mean changing the level of sedation.  The procedure will be performed. After the procedure  Your blood pressure, heart rate, breathing rate, and blood oxygen level will be monitored until the medicines you were given have worn off.  Do not drive for 24 hours if you received a sedative.  You may: ? Feel sleepy, clumsy, or nauseous. ? Feel forgetful about what happened after the procedure. ? Have a sore throat if you had a  breathing tube during the procedure. ? Vomit. This information is not intended to replace advice given to you by your health care provider. Make sure you discuss any questions you have with your health care provider. Document Released: 11/16/2004 Document Revised: 07/30/2015 Document Reviewed: 06/13/2015 Elsevier Interactive Patient Education  2018 [place], Care After These instructions provide you with information about caring for yourself after your procedure. Your health care provider may also give you more specific instructions. Your treatment has been planned according to current medical practices, but problems sometimes occur. Call your health care provider if you have any problems or questions after your procedure. What can I expect after the procedure? After your procedure, it is common to:  Feel sleepy for several hours.  Feel clumsy and have poor balance for several hours.  Feel forgetful about what happened after the procedure.  Have poor judgment for several hours.  Feel nauseous or vomit.  Have a sore throat if you had a breathing tube during the procedure.  Follow these instructions at home: For at least 24 hours after the procedure:   Do not: ? Participate in activities in which you could fall or become injured. ? Drive. ? Use heavy machinery. ? Drink alcohol. ? Take sleeping pills or medicines that cause drowsiness. ? Make important decisions or sign legal documents. ? Take care of children on your own.  Rest. Eating and drinking  Follow the diet that is recommended by your health care provider.  If you vomit, drink water, juice, or soup when you can drink without vomiting.  Make sure you have little or no nausea before eating solid foods. General instructions  Have a responsible adult stay with you until you are awake and alert.  Take over-the-counter and prescription medicines only as told by your health care  provider.  If you smoke, do not smoke without supervision.  Keep all follow-up visits as told by your health care provider. This is important. Contact a health care provider if:  You keep feeling nauseous or you keep vomiting.  You feel light-headed.  You develop a rash.  You have a fever. Get help right away if:  You have trouble breathing. This information is not intended to replace advice given to you by your health care provider. Make sure you discuss any questions you have with your health care provider. Document Released: 06/13/2015 Document Revised: 10/13/2015 Document Reviewed: 06/13/2015 Elsevier Interactive Patient Education  [employer].

## 2018-01-21 ENCOUNTER — Encounter (HOSPITAL_COMMUNITY)
Admission: RE | Admit: 2018-01-21 | Discharge: 2018-01-21 | Disposition: A | Discharge status: Home / Self Care | Payer: No Typology Code available for payment source | Source: Ambulatory Visit | Attending: Gastroenterology | Admitting: Gastroenterology

## 2018-01-21 ENCOUNTER — Other Ambulatory Visit: Payer: Self-pay

## 2018-01-21 ENCOUNTER — Telehealth: Payer: Self-pay | Admitting: *Deleted

## 2018-01-21 ENCOUNTER — Encounter (HOSPITAL_COMMUNITY): Payer: Self-pay

## 2018-01-21 DIAGNOSIS — F329 Major depressive disorder, single episode, unspecified: Secondary | ICD-10-CM | POA: Diagnosis not present

## 2018-01-21 DIAGNOSIS — K449 Diaphragmatic hernia without obstruction or gangrene: Secondary | ICD-10-CM | POA: Diagnosis not present

## 2018-01-21 DIAGNOSIS — R1310 Dysphagia, unspecified: Secondary | ICD-10-CM

## 2018-01-21 DIAGNOSIS — K222 Esophageal obstruction: Secondary | ICD-10-CM | POA: Diagnosis not present

## 2018-01-21 DIAGNOSIS — D123 Benign neoplasm of transverse colon: Secondary | ICD-10-CM | POA: Diagnosis not present

## 2018-01-21 DIAGNOSIS — R000 Tachycardia, unspecified: Secondary | ICD-10-CM | POA: Insufficient documentation

## 2018-01-21 DIAGNOSIS — K2970 Gastritis, unspecified, without bleeding: Secondary | ICD-10-CM | POA: Diagnosis not present

## 2018-01-21 DIAGNOSIS — K648 Other hemorrhoids: Secondary | ICD-10-CM | POA: Diagnosis not present

## 2018-01-21 DIAGNOSIS — E039 Hypothyroidism, unspecified: Secondary | ICD-10-CM | POA: Diagnosis not present

## 2018-01-21 DIAGNOSIS — K644 Residual hemorrhoidal skin tags: Secondary | ICD-10-CM | POA: Diagnosis not present

## 2018-01-21 DIAGNOSIS — K219 Gastro-esophageal reflux disease without esophagitis: Secondary | ICD-10-CM | POA: Diagnosis not present

## 2018-01-21 DIAGNOSIS — R1013 Epigastric pain: Secondary | ICD-10-CM

## 2018-01-21 DIAGNOSIS — Z87891 Personal history of nicotine dependence: Secondary | ICD-10-CM | POA: Diagnosis not present

## 2018-01-21 DIAGNOSIS — Z01818 Encounter for other preprocedural examination: Secondary | ICD-10-CM

## 2018-01-21 DIAGNOSIS — Z1211 Encounter for screening for malignant neoplasm of colon: Secondary | ICD-10-CM | POA: Insufficient documentation

## 2018-01-21 DIAGNOSIS — K6389 Other specified diseases of intestine: Secondary | ICD-10-CM | POA: Diagnosis not present

## 2018-01-21 DIAGNOSIS — J45909 Unspecified asthma, uncomplicated: Secondary | ICD-10-CM | POA: Diagnosis not present

## 2018-01-21 DIAGNOSIS — Z6841 Body Mass Index (BMI) 40.0 and over, adult: Secondary | ICD-10-CM | POA: Diagnosis not present

## 2018-01-21 HISTORY — DX: Hypothyroidism, unspecified: E03.9

## 2018-01-21 LAB — BASIC METABOLIC PANEL
ANION GAP: 11 (ref 5–15)
BUN: 16 mg/dL (ref 8–23)
CALCIUM: 9 mg/dL (ref 8.9–10.3)
CO2: 21 mmol/L — ABNORMAL LOW (ref 22–32)
CREATININE: 0.98 mg/dL (ref 0.44–1.00)
Chloride: 105 mmol/L (ref 98–111)
Glucose, Bld: 145 mg/dL — ABNORMAL HIGH (ref 70–99)
Potassium: 3.7 mmol/L (ref 3.5–5.1)
SODIUM: 137 mmol/L (ref 135–145)

## 2018-01-21 LAB — CBC WITH DIFFERENTIAL/PLATELET
Abs Immature Granulocytes: 0.02 10*3/uL (ref 0.00–0.07)
BASOS ABS: 0.1 10*3/uL (ref 0.0–0.1)
BASOS PCT: 1 %
EOS ABS: 0.2 10*3/uL (ref 0.0–0.5)
Eosinophils Relative: 2 %
HEMATOCRIT: 45.3 % (ref 36.0–46.0)
Hemoglobin: 14.8 g/dL (ref 12.0–15.0)
IMMATURE GRANULOCYTES: 0 %
Lymphocytes Relative: 32 %
Lymphs Abs: 2.4 10*3/uL (ref 0.7–4.0)
MCH: 28.9 pg (ref 26.0–34.0)
MCHC: 32.7 g/dL (ref 30.0–36.0)
MCV: 88.5 fL (ref 80.0–100.0)
Monocytes Absolute: 0.7 10*3/uL (ref 0.1–1.0)
Monocytes Relative: 9 %
NEUTROS PCT: 56 %
Neutro Abs: 4.3 10*3/uL (ref 1.7–7.7)
PLATELETS: 308 10*3/uL (ref 150–400)
RBC: 5.12 MIL/uL — AB (ref 3.87–5.11)
RDW: 13.4 % (ref 11.5–15.5)
WBC: 7.6 10*3/uL (ref 4.0–10.5)
nRBC: 0 % (ref 0.0–0.2)

## 2018-01-21 NOTE — Telephone Encounter (Signed)
LMOVM for pt to discuss prep times for tomorrow.  Per Hoyle Sauer in Endo patient was moved up on [facility] schedule for 01/22/18 to 10:15am. Patient was given arrival time in pre-op today. Patient was not instructed on new prep times for tomorrow since TCS was moved up.

## 2018-01-21 NOTE — Telephone Encounter (Signed)
Patient called back and is aware of times to start drinking prep tomorrow starting at 5:15am. She can have clear liquids ONLY until 7:15am. Nothing after this point. Patient voiced understanding

## 2018-01-22 ENCOUNTER — Ambulatory Visit (HOSPITAL_COMMUNITY)
Admission: RE | Admit: 2018-01-22 | Discharge: 2018-01-22 | Disposition: A | Discharge status: Home / Self Care | Payer: No Typology Code available for payment source | Source: Ambulatory Visit | Attending: Gastroenterology | Admitting: Gastroenterology

## 2018-01-22 ENCOUNTER — Other Ambulatory Visit: Payer: Self-pay

## 2018-01-22 ENCOUNTER — Encounter (HOSPITAL_COMMUNITY)
Admission: RE | Disposition: A | Discharge status: Home / Self Care | Payer: Self-pay | Source: Ambulatory Visit | Attending: Gastroenterology

## 2018-01-22 ENCOUNTER — Ambulatory Visit (HOSPITAL_COMMUNITY): Payer: No Typology Code available for payment source | Admitting: Anesthesiology

## 2018-01-22 ENCOUNTER — Encounter (HOSPITAL_COMMUNITY): Payer: Self-pay | Admitting: Anesthesiology

## 2018-01-22 DIAGNOSIS — F329 Major depressive disorder, single episode, unspecified: Secondary | ICD-10-CM | POA: Insufficient documentation

## 2018-01-22 DIAGNOSIS — K2970 Gastritis, unspecified, without bleeding: Secondary | ICD-10-CM

## 2018-01-22 DIAGNOSIS — K219 Gastro-esophageal reflux disease without esophagitis: Secondary | ICD-10-CM | POA: Insufficient documentation

## 2018-01-22 DIAGNOSIS — D123 Benign neoplasm of transverse colon: Secondary | ICD-10-CM | POA: Diagnosis not present

## 2018-01-22 DIAGNOSIS — J45909 Unspecified asthma, uncomplicated: Secondary | ICD-10-CM | POA: Insufficient documentation

## 2018-01-22 DIAGNOSIS — K449 Diaphragmatic hernia without obstruction or gangrene: Secondary | ICD-10-CM

## 2018-01-22 DIAGNOSIS — R1013 Epigastric pain: Secondary | ICD-10-CM

## 2018-01-22 DIAGNOSIS — E039 Hypothyroidism, unspecified: Secondary | ICD-10-CM | POA: Insufficient documentation

## 2018-01-22 DIAGNOSIS — K648 Other hemorrhoids: Secondary | ICD-10-CM

## 2018-01-22 DIAGNOSIS — Z6841 Body Mass Index (BMI) 40.0 and over, adult: Secondary | ICD-10-CM | POA: Insufficient documentation

## 2018-01-22 DIAGNOSIS — K222 Esophageal obstruction: Secondary | ICD-10-CM | POA: Diagnosis not present

## 2018-01-22 DIAGNOSIS — Z87891 Personal history of nicotine dependence: Secondary | ICD-10-CM | POA: Insufficient documentation

## 2018-01-22 DIAGNOSIS — K644 Residual hemorrhoidal skin tags: Secondary | ICD-10-CM | POA: Insufficient documentation

## 2018-01-22 DIAGNOSIS — Z1211 Encounter for screening for malignant neoplasm of colon: Secondary | ICD-10-CM

## 2018-01-22 DIAGNOSIS — K6389 Other specified diseases of intestine: Secondary | ICD-10-CM

## 2018-01-22 DIAGNOSIS — R1310 Dysphagia, unspecified: Secondary | ICD-10-CM

## 2018-01-22 HISTORY — PX: BIOPSY: SHX5522

## 2018-01-22 HISTORY — PX: ESOPHAGOGASTRODUODENOSCOPY (EGD) WITH PROPOFOL: SHX5813

## 2018-01-22 HISTORY — PX: SAVORY DILATION: SHX5439

## 2018-01-22 HISTORY — PX: POLYPECTOMY: SHX5525

## 2018-01-22 HISTORY — PX: COLONOSCOPY WITH PROPOFOL: SHX5780

## 2018-01-22 SURGERY — COLONOSCOPY WITH PROPOFOL
Anesthesia: General

## 2018-01-22 MED ORDER — PROPOFOL 10 MG/ML IV BOLUS
INTRAVENOUS | Status: DC | PRN
  Administered 2018-01-22: 40 mg via INTRAVENOUS

## 2018-01-22 MED ORDER — CHLORHEXIDINE GLUCONATE CLOTH 2 % EX PADS: 6.0000 | MEDICATED_PAD | Freq: Once | CUTANEOUS | Status: DC

## 2018-01-22 MED ORDER — HYDROCODONE-ACETAMINOPHEN 7.5-325 MG PO TABS: 1.0000 | ORAL_TABLET | Freq: Once | ORAL | Status: DC | PRN

## 2018-01-22 MED ORDER — LACTATED RINGERS IV SOLN
INTRAVENOUS | Status: DC
  Administered 2018-01-22: 09:00:00 via INTRAVENOUS

## 2018-01-22 MED ORDER — STERILE WATER FOR IRRIGATION IR SOLN
Status: DC | PRN
  Administered 2018-01-22: 1.5 mL

## 2018-01-22 MED ORDER — PROPOFOL 500 MG/50ML IV EMUL
INTRAVENOUS | Status: DC | PRN
  Administered 2018-01-22: 150 ug/kg/min via INTRAVENOUS
  Administered 2018-01-22: 11:00:00 via INTRAVENOUS

## 2018-01-22 MED ORDER — HYDROMORPHONE HCL 1 MG/ML IJ SOLN: 0.2500 mg | INTRAMUSCULAR | Status: DC | PRN

## 2018-01-22 MED ORDER — PROMETHAZINE HCL 25 MG/ML IJ SOLN: 6.2500 mg | INTRAMUSCULAR | Status: DC | PRN

## 2018-01-22 MED ORDER — MIDAZOLAM HCL 2 MG/2ML IJ SOLN: 0.5000 mg | Freq: Once | INTRAMUSCULAR | Status: DC | PRN

## 2018-01-22 MED ORDER — EPHEDRINE SULFATE 50 MG/ML IJ SOLN
INTRAMUSCULAR | Status: DC | PRN
  Administered 2018-01-22: 30 mg via INTRAVENOUS

## 2018-01-22 NOTE — Op Note (Addendum)
[facility] Patient Name: Erin Hale Procedure Date: 01/22/2018 11:00 AM MRN: [id number] Date of Birth: 1955/09/05 Attending MD: Barney Drain MD, MD CSN: [id number] Age: 62 Admit Type: Outpatient Procedure:                Upper GI endoscopy WITH COLD FORCEPS                            BIOPSY/ESOPHAGEAL DILATION Indications:              Dysphagia Providers:                Barney Drain MD, MD, Janeece Riggers, RN, Gerome Sam, RN, Nelma Rothman, Technician Referring MD:             Koleen Distance. Gottschalk Medicines:                Propofol per Anesthesia Complications:            No immediate complications. Estimated Blood Loss:     Estimated blood loss was minimal. Procedure:                Pre-Anesthesia Assessment:                           - Prior to the procedure, a History and Physical                            was performed, and patient medications and                            allergies were reviewed. The patient's tolerance of                            previous anesthesia was also reviewed. The risks                            and benefits of the procedure and the sedation                            options and risks were discussed with the patient.                            All questions were answered, and informed consent                            was obtained. Prior Anticoagulants: The patient has                            taken no previous anticoagulant or antiplatelet                            agents. ASA Grade Assessment: II - A patient with  mild systemic disease. After reviewing the risks                            and benefits, the patient was deemed in                            satisfactory condition to undergo the procedure.                            After obtaining informed consent, the endoscope was                            passed under direct vision. Throughout the   procedure, the patient's blood pressure, pulse, and                            oxygen saturations were monitored continuously. The                            [id number]-[id number] ([id number]) scope was introduced through the                            mouth, and advanced to the second part of duodenum.                            The upper GI endoscopy was accomplished without                            difficulty. The patient tolerated the procedure                            well. Scope In: 11:03:11 AM Scope Out: 11:10:11 AM Total Procedure Duration: 0 hours 7 minutes 0 seconds  Findings:      One benign-appearing, intrinsic moderate (circumferential scarring or       stenosis; an endoscope may pass) stenosis was found. This stenosis       measured 1.4 cm (inner diameter). The stenosis was traversed. A       guidewire was placed and the scope was withdrawn. Dilation was performed       with a Savary dilator with mild resistance at 16 mm and 17 mm.      A small hiatal hernia was present.      Patchy mild inflammation characterized by congestion (edema) and       erythema was found in the gastric antrum. Biopsies were taken with a       cold forceps for Helicobacter pylori testing.      The examined duodenum was normal. Impression:               - DYSPHAGIS DUE TO Benign-appearingPEPTIC                            STRICTURE. Dilated.                           - Small hiatal hernia.                           -  MILD Gastritis. Biopsied. Moderate Sedation:      Per Anesthesia Care Recommendation:           - Patient has a contact number available for                            emergencies. The signs and symptoms of potential                            delayed complications were discussed with the                            patient. Return to normal activities tomorrow.                            Written discharge instructions were provided to the                            patient.                            - High fiber diet and lactose free diet. LOSE                            WEIGHT TO BMI < 30. CALL FOR REFERRAL TO [facility].                           - Continue present medications.                           - Await pathology results.                           - Return to my office in 4 months. Procedure Code(s):        --- Professional ---                           [phone], Esophagogastroduodenoscopy, flexible,                            transoral; with insertion of guide wire followed by                            passage of dilator(s) through esophagus over guide                            wire                           85885, 59, Esophagogastroduodenoscopy, flexible,                            transoral; with biopsy, single or multiple Diagnosis Code(s):        ---  Professional ---                           K22.2, Esophageal obstruction                           K44.9, Diaphragmatic hernia without obstruction or                            gangrene                           K29.70, Gastritis, unspecified, without bleeding                           R13.10, Dysphagia, unspecified CPT copyright 2018 American Medical Association. All rights reserved. The codes documented in this report are preliminary and upon coder review may  be revised to meet current compliance requirements. Barney Drain, MD Barney Drain MD, MD 01/22/2018 11:33:49 AM This report has been signed electronically. Number of Addenda: 0

## 2018-01-22 NOTE — Anesthesia Postprocedure Evaluation (Signed)
Anesthesia Post Note  Patient: JOELYS STAUBS  Procedure(s) Performed: COLONOSCOPY WITH PROPOFOL (N/A ) ESOPHAGOGASTRODUODENOSCOPY (EGD) WITH PROPOFOL (N/A ) SAVORY DILATION (N/A ) BIOPSY POLYPECTOMY  Patient location during evaluation: PACU Anesthesia Type: General Level of consciousness: awake and alert and oriented Pain management: pain level controlled Vital Signs Assessment: post-procedure vital signs reviewed and stable Respiratory status: spontaneous breathing Cardiovascular status: stable Postop Assessment: no apparent nausea or vomiting Anesthetic complications: no     Last Vitals:  Vitals:   01/22/18 0904 01/22/18 1116  BP: (!) 134/91 (P) 96/68  Pulse: 100 (P) 76  Resp: 20 (P) 20  Temp: 36.9 C (P) 36.6 C  SpO2: 100% (P) 96%    Last Pain:  Vitals:   01/22/18 1030  TempSrc:   PainSc: 0-No pain                 ADAMS, AMY A

## 2018-01-22 NOTE — Transfer of Care (Signed)
Immediate Anesthesia Transfer of Care Note  Patient: Erin Hale  Procedure(s) Performed: COLONOSCOPY WITH PROPOFOL (N/A ) ESOPHAGOGASTRODUODENOSCOPY (EGD) WITH PROPOFOL (N/A ) SAVORY DILATION (N/A ) BIOPSY POLYPECTOMY  Patient Location: PACU  Anesthesia Type:General  Level of Consciousness: awake, alert , oriented and patient cooperative  Airway & Oxygen Therapy: Patient Spontanous Breathing  Post-op Assessment: Report given to RN and Post -op Vital signs reviewed and stable  Post vital signs: Reviewed and stable  Last Vitals:  Vitals Value Taken Time  BP 96/68 01/22/2018 11:17 AM  Temp    Pulse 74 01/22/2018 11:20 AM  Resp 21 01/22/2018 11:20 AM  SpO2 97 % 01/22/2018 11:20 AM  Vitals shown include unvalidated device data.  Last Pain:  Vitals:   01/22/18 1030  TempSrc:   PainSc: 0-No pain         Complications: No apparent anesthesia complications

## 2018-01-22 NOTE — Op Note (Addendum)
[facility] Patient Name: Erin Hale Procedure Date: 01/22/2018 10:18 AM MRN: [id number] Date of Birth: 1955-06-09 Attending MD: Barney Drain MD, MD CSN: [id number] Age: 62 Admit Type: Outpatient Procedure:                Colonoscopy WITH COLD FORCEPS/COLD SNARE POLYPECTOMY Indications:              Screening for colorectal malignant neoplasm Providers:                Barney Drain MD, MD, Janeece Riggers, RN, Gerome Sam, RN, Nelma Rothman, Technician Referring MD:             Koleen Distance. Gottschalk Medicines:                Propofol per Anesthesia Complications:            No immediate complications. Estimated Blood Loss:     Estimated blood loss was minimal. Procedure:                Pre-Anesthesia Assessment:                           - Prior to the procedure, a History and Physical                            was performed, and patient medications and                            allergies were reviewed. The patient's tolerance of                            previous anesthesia was also reviewed. The risks                            and benefits of the procedure and the sedation                            options and risks were discussed with the patient.                            All questions were answered, and informed consent                            was obtained. Prior Anticoagulants: The patient has                            taken no previous anticoagulant or antiplatelet                            agents. ASA Grade Assessment: II - A patient with                            mild systemic disease. After reviewing the risks  and benefits, the patient was deemed in                            satisfactory condition to undergo the procedure.                            After obtaining informed consent, the colonoscope                            was passed under direct vision. Throughout the                            procedure,  the patient's blood pressure, pulse, and                            oxygen saturations were monitored continuously. The                            [id number]-[id number] ([id number]) scope was introduced through                            the anus and advanced to the the cecum, identified                            by appendiceal orifice and ileocecal valve. The                            colonoscopy was somewhat difficult due to a                            tortuous colon. Successful completion of the                            procedure was aided by straightening and shortening                            the scope to obtain bowel loop reduction and                            COLOWRAP. The patient tolerated the procedure well.                            The quality of the bowel preparation was good. The                            ileocecal valve, appendiceal orifice, and rectum                            were photographed. Scope In: 10:42:21 AM Scope Out: 10:56:20 AM Scope Withdrawal Time: 0 hours 12 minutes 14 seconds  Total Procedure Duration: 0 hours 13 minutes 59 seconds  Findings:      A 6 mm polyp was found in the splenic flexure. The polyp was sessile.  The polyp was removed with a cold snare. Resection and retrieval were       complete.      One submucosal nodule was found at the hepatic flexure. This was       biopsied with a cold forceps for histology.      External and internal hemorrhoids were found. The hemorrhoids were small.      The recto-sigmoid colon and sigmoid colon were mildly redundant. Impression:               - One 6 mm polyp at the splenic flexure, removed                            with a cold snare. Resected and retrieved.                           - Submucosal nodule at the hepatic flexure.                            Biopsied.                           - External and internal hemorrhoids.                           - Redundant LEFT colon. Moderate Sedation:      Per  Anesthesia Care Recommendation:           - Patient has a contact number available for                            emergencies. The signs and symptoms of potential                            delayed complications were discussed with the                            patient. Return to normal activities tomorrow.                            Written discharge instructions were provided to the                            patient.                           - High fiber diet.                           - Continue present medications.                           - Await pathology results.                           - Repeat colonoscopy in 5-10 years for surveillance.                           -  Return to GI office in 4 months. Procedure Code(s):        --- Professional ---                           [phone], Colonoscopy, flexible; with removal of                            tumor(s), polyp(s), or other lesion(s) by snare                            technique                           45380, 63, Colonoscopy, flexible; with biopsy,                            single or multiple Diagnosis Code(s):        --- Professional ---                           Z12.11, Encounter for screening for malignant                            neoplasm of colon                           D12.3, Benign neoplasm of transverse colon (hepatic                            flexure or splenic flexure)                           K63.89, Other specified diseases of intestine                           K64.8, Other hemorrhoids                           Q43.8, Other specified congenital malformations of                            intestine CPT copyright 2018 American Medical Association. All rights reserved. The codes documented in this report are preliminary and upon coder review may  be revised to meet current compliance requirements. Barney Drain, MD Barney Drain MD, MD 01/22/2018 09:38:18 AM This report has been signed  electronically. Number of Addenda: 0

## 2018-01-22 NOTE — H&P (Signed)
Primary Care Physician:  [name], DO Primary Gastroenterologist:  Dr. Oneida Alar  Pre-Procedure History & Physical: HPI:  Erin Hale is a 62 y.o. female here for DYSPEPSIA/DYSPHAGIA/screening.  Past Medical History:  Diagnosis Date  . Bronchial asthma   . Depression   . GERD (gastroesophageal reflux disease)   . Hypothyroidism   . Panic disorder   . Thyroid disease    hypothyroidism    Past Surgical History:  Procedure Laterality Date  . ABDOMINAL HYSTERECTOMY      Prior to Admission medications   Medication Sig Start Date End Date Taking? Authorizing Provider  acetaminophen (TYLENOL) 650 MG CR tablet Take 650 mg by mouth every 8 (eight) hours as needed for pain.   Yes [provider]  albuterol (PROVENTIL HFA;VENTOLIN HFA) 108 (90 Base) MCG/ACT inhaler Inhale 2 puffs into the lungs every 6 (six) hours as needed for wheezing or shortness of breath. 04/24/17  Yes Martin, Mary-Margaret, FNP  buPROPion (WELLBUTRIN XL) 150 MG 24 hr tablet TAKE 1 TABLET BY MOUTH ONCE DAILY Patient taking differently: Take 150 mg by mouth daily.  01/04/18  Yes Martin, Mary-Margaret, FNP  clonazePAM (KLONOPIN) 0.5 MG tablet TAKE 1 TABLET BY MOUTH TWICE DAILY AS NEEDED FOR ANXIETY (PANIC) Patient taking differently: Take 0.5 mg by mouth 2 (two) times daily.  01/08/18  Yes Martin, Mary-Margaret, FNP  diphenhydrAMINE (BENADRYL) 25 mg capsule Take 25 mg by mouth every 6 (six) hours as needed.   Yes [provider]  escitalopram (LEXAPRO) 20 MG tablet TAKE 1 TABLET BY MOUTH ONCE DAILY Patient taking differently: Take 20 mg by mouth daily.  01/04/18  Yes Martin, Mary-Margaret, FNP  levothyroxine (SYNTHROID, LEVOTHROID) 75 MCG tablet TAKE 1 TABLET BY MOUTH ONCE DAILY BEFORE BREAKFAST Patient taking differently: Take 75 mcg by mouth daily before breakfast.  11/02/17  Yes Hassell Done, Mary-Margaret, FNP  Melatonin 5 MG CAPS Take 5 mg by mouth.    Yes [provider]  pantoprazole  (PROTONIX) 40 MG tablet Take 1 tablet (40 mg total) by mouth daily. Take 30 minutes before breakfast 11/29/17  Yes Annitta Needs, NP  ranitidine (ZANTAC) 150 MG capsule Take 1 capsule (150 mg total) by mouth 2 (two) times daily. 03/20/17  Yes Hassell Done, Mary-Margaret, FNP  simethicone (MYLICON) 458 MG chewable tablet Chew 250 mg by mouth as needed for flatulence.   Yes [provider]    Allergies as of 11/29/2017  . (No Known Allergies)    Family History  Problem Relation Age of Onset  . Stroke Mother   . Congestive Heart Failure Mother   . Diabetes Father   . Cancer Father        brain  . Colon cancer Neg Hx   . Colon polyps Neg Hx     Social History   Socioeconomic History  . Marital status: Divorced    Spouse name: Not on file  . Number of children: Not on file  . Years of education: Not on file  . Highest education level: Not on file  Occupational History  . Not on file  Social Needs  . Financial resource strain: Not on file  . Food insecurity:    Worry: Not on file    Inability: Not on file  . Transportation needs:    Medical: Not on file    Non-medical: Not on file  Tobacco Use  . Smoking status: Former Smoker    Types: Cigarettes    Last attempt to quit: 03/15/1994  Years since quitting: 23.8  . Smokeless tobacco: Never Used  Substance and Sexual Activity  . Alcohol use: No  . Drug use: No  . Sexual activity: Not on file  Lifestyle  . Physical activity:    Days per week: Not on file    Minutes per session: Not on file  . Stress: Not on file  Relationships  . Social connections:    Talks on phone: Not on file    Gets together: Not on file    Attends religious service: Not on file    Active member of club or organization: Not on file    Attends meetings of clubs or organizations: Not on file    Relationship status: Not on file  . Intimate partner violence:    Fear of current or ex partner: Not on file    Emotionally abused: Not on file     Physically abused: Not on file    Forced sexual activity: Not on file  Other Topics Concern  . Not on file  Social History Narrative  . Not on file    Review of Systems: See HPI, otherwise negative ROS   Physical Exam: BP (!) 134/91   Pulse 100   Temp 98.4 F (36.9 C) (Oral)   Resp 20   Ht 5\' 4"  (1.626 m)   Wt 112.6 kg   LMP  (LMP Unknown)   SpO2 100%   BMI 42.61 kg/m  General:   Alert,  pleasant and cooperative in NAD Head:  Normocephalic and atraumatic. Neck:  Supple; Lungs:  Clear throughout to auscultation.    Heart:  Regular rate and rhythm. Abdomen:  Soft, nontender and nondistended. Normal bowel sounds, without guarding, and without rebound.   Neurologic:  Alert and  oriented x4;  grossly normal neurologically.  Impression/Plan:     DYSPEPSIA/DYSPHAGIA/screening  PLAN:  EGD/DIL/tcs TODAY. DISCUSSED PROCEDURE, BENEFITS, & RISKS: < 1% chance of medication reaction, bleeding, perforation, or rupture of spleen/liver.

## 2018-01-22 NOTE — Anesthesia Procedure Notes (Signed)
Procedure Name: General with mask airway Date/Time: 01/22/2018 10:30 AM Performed by: Andree Elk Amy A, CRNA Pre-anesthesia Checklist: Patient identified, Emergency Drugs available, Suction available, Patient being monitored and Timeout performed

## 2018-01-22 NOTE — Progress Notes (Signed)
CC'D TO PCP °

## 2018-01-22 NOTE — Anesthesia Preprocedure Evaluation (Signed)
Anesthesia Evaluation  Patient identified by MRN, date of birth, ID band Patient awake    Reviewed: Allergy & Precautions, NPO status , Patient's Chart, lab work & pertinent test results  Airway Mallampati: II  TM Distance: >3 FB Neck ROM: Full    Dental no notable dental hx. 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 [employer] negative neurological ROS  negative psych ROS   GI/Hepatic Neg liver ROS, GERD  Medicated and Controlled,Denies GERD sx today     Endo/Other  Hypothyroidism Morbid obesity  Renal/GU negative Renal ROS  negative genitourinary   Musculoskeletal negative musculoskeletal ROS (+)   Abdominal   Peds negative pediatric ROS (+)  Hematology negative hematology ROS (+)   Anesthesia Other Findings   Reproductive/Obstetrics negative OB ROS                             Anesthesia Physical Anesthesia Plan  ASA: II  Anesthesia Plan: General   Post-op Pain Management:    Induction: Intravenous  PONV Risk Score and Plan:   Airway Management Planned: Nasal Cannula and Simple Face Mask  Additional Equipment:   Intra-op Plan:   Post-operative Plan:   Informed Consent: I have reviewed the patients History and Physical, chart, labs and discussed the procedure including the risks, benefits and alternatives for the proposed anesthesia with the patient or authorized representative who has indicated his/her understanding and acceptance.   Dental advisory given  Plan Discussed with: CRNA  Anesthesia Plan Comments:         Anesthesia Quick Evaluation

## 2018-01-22 NOTE — Discharge Instructions (Signed)
You had 1 small polyp removed. I BIOPSIED A BENIGN APPEARING LESION IN YOUR COLON AS WELL. You have internal hemorrhoids. 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 I biopsied your stomach.   DRINK WATER TO KEEP YOUR URINE LIGHT YELLOW.  CONTINUE YOUR WEIGHT LOSS EFFORTS. YOUR BODY MASS INDEX IS OVER 40 WHICH MEANS YOU ARE MORBIDLY OBESE. OBESITY IS ASSOCIATED WITH AN INCREASED FOR CIRRHOSIS AND ALL CANCERS, INCLUDING ESOPHAGEAL AND COLON CANCER. A WEIGHT OF 230 LBS OR LESS WILL GET YOUR BODY MASS INDEX(BMI) UNDER 40 BUT YOUR BODY MASS INDEX WILL STILL BE OVER 30 WHICH MEANS YOU ARE OBESE.  A WEIGHT OF 170 LBS OR LESS  WILL GET YOUR BODY MASS INDEX(BMI) UNDER 30. IF YOU TRY AND CANNOT LOSE WEIGHT PLEASE CALL FOR A REFERRAL TO THE BARIATRIC WEIGHT LOSS CLINIC.  FOLLOW A HIGH FIBER/LACTOSE FREE DIET. AVOID ITEMS THAT CAUSE BLOATING. SEE INFO BELOW.  CONTINUE PROTONIX. TAKE 30 MINUTES PRIOR TO MEALS TWICE DAILY.  YOUR BIOPSY RESULTS WILL BE BACK IN 5 BUSINESS DAYS.  FOLLOW UP IN 4 MOS.  Next colonoscopy in 5-10 years.  ENDOSCOPY Care After Read the instructions outlined below and refer to this sheet in the next week. These discharge instructions provide you with general information on caring for yourself after you leave the hospital. While your treatment has been planned according to the most current medical practices available, unavoidable complications occasionally occur. If you have any problems or questions after discharge, call DR. Islam Eichinger, [phone].  ACTIVITY  You may resume your regular activity, but move at a slower pace for the next 24 hours.   Take frequent rest periods for the next 24 hours.   Walking will help get rid of the air and reduce the bloated feeling in your belly (abdomen).   No driving for 24 hours (because of the medicine (anesthesia) used during the test).   You may shower.   Do not sign any important legal  documents or operate any machinery for 24 hours (because of the anesthesia used during the test).    NUTRITION  Drink plenty of fluids.   You may resume your normal diet as instructed by your doctor.   Begin with a light meal and progress to your normal diet. Heavy or fried foods are harder to digest and may make you feel sick to your stomach (nauseated).   Avoid alcoholic beverages for 24 hours or as instructed.    MEDICATIONS  You may resume your normal medications.   WHAT YOU CAN EXPECT TODAY  Some feelings of bloating in the abdomen.   Passage of more gas than usual.   Spotting of blood in your stool or on the toilet paper  .  IF YOU HAD POLYPS REMOVED DURING THE ENDOSCOPY:  Eat a soft diet IF YOU HAVE NAUSEA, BLOATING, ABDOMINAL PAIN, OR VOMITING.    FINDING OUT THE RESULTS OF YOUR TEST Not all test results are available during your visit. DR. Oneida Alar WILL CALL YOU WITHIN 14 DAYS OF YOUR PROCEDUE WITH YOUR RESULTS. Do not assume everything is normal if you have not heard from DR. Aarush Stukey, CALL HER OFFICE AT [phone].  SEEK IMMEDIATE MEDICAL ATTENTION AND CALL THE OFFICE: [phone] IF:  You have more than a spotting of blood in your stool.   Your belly is swollen (abdominal distention).   You are nauseated or vomiting.   You have a temperature over 101F.   You have  abdominal pain or discomfort that is severe or gets worse throughout the day.  Gastritis  Gastritis is an inflammation (the body's way of reacting to injury and/or infection) of the stomach. It is often caused by viral or bacterial (germ) infections. It can also be caused BY ASPIRIN, BC/GOODY POWDER'S, (IBUPROFEN) MOTRIN, OR ALEVE (NAPROXEN), chemicals (including alcohol), SPICY FOODS, and medications. This illness may be associated with generalized malaise (feeling tired, not well), UPPER ABDOMINAL STOMACH cramps, and fever. One common bacterial cause of gastritis is an organism known as H.  Pylori. This can be treated with antibiotics.    Lactose Free Diet Lactose is a carbohydrate that is found mainly in milk and milk products, as well as in foods with added milk or whey. Lactose must be digested by the enzyme in order to be used by the body. Lactose intolerance occurs when there is a shortage of lactase. When your body is not able to digest lactose, you may feel sick to your stomach (nausea), bloating, cramping, gas and diarrhea.  There are many dairy products that may be tolerated better than milk by some people:  The use of cultured dairy products such as yogurt, buttermilk, cottage cheese, and sweet acidophilus milk (Kefir) for lactase-deficient individuals is usually well tolerated. This is because the healthy bacteria help digest lactose.   Lactose-hydrolyzed milk (Lactaid) contains 40-90% less lactose than milk and may also be well tolerated.    SPECIAL NOTES  Lactose is a carbohydrates. The major food source is dairy products. Reading food labels is important. Many products contain lactose even when they are not made from milk. Look for the following words: whey, milk solids, dry milk solids, nonfat dry milk powder. Typical sources of lactose other than dairy products include breads, candies, cold cuts, prepared and processed foods, and commercial sauces and gravies.   All foods must be prepared without milk, cream, or other dairy foods.   Soy milk and lactose-free supplements (LACTASE) may be used as an alternative to milk.   FOOD GROUP ALLOWED/RECOMMENDED AVOID/USE SPARINGLY  BREADS / STARCHES 4 servings or more* Breads and rolls made without milk. [place], [place], or [place] bread. Breads and rolls that contain milk. Prepared mixes such as muffins, biscuits, waffles, pancakes. Sweet rolls, donuts, [place] toast (if made with milk or lactose).  Crackers: Soda crackers, graham crackers. Any crackers prepared without lactose. Zwieback crackers, corn curls, or any that  contain lactose.  Cereals: Cooked or dry cereals prepared without lactose (read labels). Cooked or dry cereals prepared with lactose (read labels). Total, Cocoa Krispies. Special K.  Potatoes / Pasta / Rice: Any prepared without milk or lactose. Popcorn. Instant potatoes, frozen [place] fries, scalloped or au gratin potatoes.  VEGETABLES 2 servings or more Fresh, frozen, and canned vegetables. Creamed or breaded vegetables. Vegetables in a cheese sauce or with lactose-containing margarines.  FRUIT 2 servings or more All fresh, canned, or frozen fruits that are not processed with lactose. Any canned or frozen fruits processed with lactose.  MEAT & SUBSTITUTES 2 servings or more (4 to 6 oz. total per day) Plain beef, chicken, fish, [place], lamb, veal, pork, or ham. Kosher prepared meat products. Strained or junior meats that do not contain milk. Eggs, soy meat substitutes, nuts. Scrambled eggs, omelets, and souffles that contain milk. Creamed or breaded meat, fish, or fowl. Sausage products such as wieners, liver sausage, or cold cuts that contain milk solids. Cheese, cottage cheese, or cheese spreads.  MILK None. (See BEVERAGES for milk  substitutes. See DESSERTS for ice cream and frozen desserts.) Milk (whole, 2%, skim, or chocolate). Evaporated, powdered, or condensed milk; malted milk.  SOUPS & COMBINATION FOODS Bouillon, broth, vegetable soups, clear soups, consomms. Homemade soups made with allowed ingredients. Combination or prepared foods that do not contain milk or milk products (read labels). Cream soups, chowders, commercially prepared soups containing lactose. Macaroni and cheese, pizza. Combination or prepared foods that contain milk or milk products.  DESSERTS & SWEETS In moderation Water and fruit ices; gelatin; angel food cake. Homemade cookies, pies, or cakes made from allowed ingredients. Pudding (if made with water or a milk substitute). Lactose-free tofu desserts. Sugar, honey,  corn syrup, jam, jelly; marmalade; molasses (beet sugar); Pure sugar candy; marshmallows. Ice cream, ice milk, sherbet, custard, pudding, frozen yogurt. Commercial cake and cookie mixes. Desserts that contain chocolate. Pie crust made with milk-containing margarine; reduced-calorie desserts made with a sugar substitute that contains lactose. Toffee, peppermint, butterscotch, chocolate, caramels.  FATS & OILS In moderation Butter (as tolerated; contains very small amounts of lactose). Margarines and dressings that do not contain milk, Vegetable oils, shortening, Miracle Whip, mayonnaise, nondairy cream & whipped toppings without lactose or milk solids added (examples: Coffee Rich, Carnation Coffeemate, Rich's Whipped Topping, PolyRich). Berniece Salines. Margarines and salad dressings containing milk; cream, cream cheese; peanut butter with added milk solids, sour cream, chip dips, made with sour cream.  BEVERAGES Carbonated drinks; tea; coffee and freeze-dried coffee; some instant coffees (check labels). Fruit drinks; fruit and vegetable juice; Rice or Soy milk. Ovaltine, hot chocolate. Some cocoas; some instant coffees; instant iced teas; powdered fruit drinks (read labels).   CONDIMENTS / MISCELLANEOUS Soy sauce, carob powder, olives, gravy made with water, baker's cocoa, pickles, pure seasonings and spices, wine, pure monosodium glutamate, catsup, mustard. Some chewing gums, chocolate, some cocoas. Certain antibiotics and vitamin / mineral preparations. Spice blends if they contain milk products. MSG extender. Artificial sweeteners that contain lactose such as Equal (Nutra-Sweet) and Sweet 'n Low. Some nondairy creamers (read labels).    High-Fiber Diet A high-fiber diet changes your normal diet to include more whole grains, legumes, fruits, and vegetables. Changes in the diet involve replacing refined carbohydrates with unrefined foods. The calorie level of the diet is essentially unchanged. The Dietary  Reference Intake (recommended amount) for adult males is 38 grams per day. For adult females, it is 25 grams per day. Pregnant and lactating women should consume 28 grams of fiber per day. Fiber is the intact part of a plant that is not broken down during digestion. Functional fiber is fiber that has been isolated from the plant to provide a beneficial effect in the body. PURPOSE  Increase stool bulk.   Ease and regulate bowel movements.   Lower cholesterol.   REDUCE RISK OF COLON CANCER   INDICATIONS THAT YOU NEED MORE FIBER  Constipation and hemorrhoids.   Uncomplicated diverticulosis (intestine condition) and irritable bowel syndrome.   Weight management.   As a protective measure against hardening of the arteries (atherosclerosis), diabetes, and cancer.   GUIDELINES FOR INCREASING FIBER IN THE DIET  Start adding fiber to the diet slowly. A gradual increase of about 5 more grams (2 slices of whole-wheat bread, 2 servings of most fruits or vegetables, or 1 bowl of high-fiber cereal) per day is best. Too rapid an increase in fiber may result in constipation, flatulence, and bloating.   Drink enough water and fluids to keep your urine clear or pale yellow. Water, juice, or caffeine-free drinks  are recommended. Not drinking enough fluid may cause constipation.   Eat a variety of high-fiber foods rather than one type of fiber.   Try to increase your intake of fiber through using high-fiber foods rather than fiber pills or supplements that contain small amounts of fiber.   The goal is to change the types of food eaten. Do not supplement your present diet with high-fiber foods, but replace foods in your present diet.   INCLUDE A VARIETY OF FIBER SOURCES  Replace refined and processed grains with whole grains, canned fruits with fresh fruits, and incorporate other fiber sources. White rice, white breads, and most bakery goods contain little or no fiber.   Brown whole-grain rice,  buckwheat oats, and many fruits and vegetables are all good sources of fiber. These include: broccoli, Brussels sprouts, cabbage, cauliflower, beets, sweet potatoes, white potatoes (skin on), carrots, tomatoes, eggplant, squash, berries, fresh fruits, and dried fruits.   Cereals appear to be the richest source of fiber. Cereal fiber is found in whole grains and bran. Bran is the fiber-rich outer coat of cereal grain, which is largely removed in refining. In whole-grain cereals, the bran remains. In breakfast cereals, the largest amount of fiber is found in those with "bran" in their names. The fiber content is sometimes indicated on the label.   You may need to include additional fruits and vegetables each day.   In baking, for 1 cup white flour, you may use the following substitutions:   1 cup whole-wheat flour minus 2 tablespoons.   1/2 cup white flour plus 1/2 cup whole-wheat flour.

## 2018-01-23 ENCOUNTER — Encounter: Payer: Self-pay | Admitting: Gastroenterology

## 2018-01-24 ENCOUNTER — Telehealth: Payer: Self-pay | Admitting: Gastroenterology

## 2018-01-24 NOTE — Telephone Encounter (Signed)
Please call pt. She had ONE simple adenoma removed. HER STOMACH BIOPSIES SHOW NO EVIDENCE FOR H PYLORI INFECTION.    DRINK WATER TO KEEP YOUR URINE LIGHT YELLOW.  CONTINUE YOUR WEIGHT LOSS EFFORTS.   FOLLOW A HIGH FIBER/LACTOSE FREE DIET. AVOID ITEMS THAT CAUSE BLOATING.   CONTINUE PROTONIX. TAKE 30 MINUTES PRIOR TO MEALS TWICE DAILY.  FOLLOW UP IN 4 MOS.  Next colonoscopy in 5-10 years.

## 2018-01-24 NOTE — Progress Notes (Signed)
CC'D TO PCP °

## 2018-01-24 NOTE — Telephone Encounter (Signed)
PT is aware.

## 2018-01-25 ENCOUNTER — Encounter (HOSPITAL_COMMUNITY): Payer: Self-pay | Admitting: Gastroenterology

## 2018-01-28 NOTE — Telephone Encounter (Signed)
PATIENT SCHEDULED AND LETTER SENT  °

## 2018-02-04 ENCOUNTER — Other Ambulatory Visit: Payer: Self-pay | Admitting: Nurse Practitioner

## 2018-02-04 ENCOUNTER — Telehealth: Payer: Self-pay | Admitting: Gastroenterology

## 2018-02-04 DIAGNOSIS — F3342 Major depressive disorder, recurrent, in full remission: Secondary | ICD-10-CM

## 2018-02-04 DIAGNOSIS — F3341 Major depressive disorder, recurrent, in partial remission: Secondary | ICD-10-CM

## 2018-02-04 NOTE — Telephone Encounter (Signed)
Pt scheduled for f/u appt 02/05/18 at 2:30 with Dr Lajuana Ripple.

## 2018-02-04 NOTE — Telephone Encounter (Signed)
[place]. NTBS 30 days given 01/04/18

## 2018-02-04 NOTE — Telephone Encounter (Signed)
[phone] please call patient, she has questions about the procedure she had

## 2018-02-04 NOTE — Telephone Encounter (Signed)
Pt was just calling for path report. She is aware Dr. Oneida Alar has been on vacation and will be back Wed.

## 2018-02-05 ENCOUNTER — Ambulatory Visit: Payer: PRIVATE HEALTH INSURANCE | Admitting: Family Medicine

## 2018-02-06 NOTE — Telephone Encounter (Signed)
Called and recording said pt is unavailable.

## 2018-02-06 NOTE — Telephone Encounter (Addendum)
Please call pt. She had ONE simple adenoma removed from her colon. SHE HAS A HEPATIC FLEXURE LIPOMA. HER stomach Bx shows mild gastritis.    DRINK WATER TO KEEP YOUR URINE LIGHT YELLOW.  CONTINUE YOUR WEIGHT LOSS EFFORTS.  A WEIGHT OF 170 LBS OR LESS  WILL GET YOUR BODY MASS INDEX(BMI) UNDER 30. IF YOU TRY AND CANNOT LOSE WEIGHT PLEASE CALL FOR A REFERRAL TO THE BARIATRIC WEIGHT LOSS CLINIC.  FOLLOW A HIGH FIBER/LACTOSE FREE DIET. AVOID ITEMS THAT CAUSE BLOATING.   CONTINUE PROTONIX. TAKE 30 MINUTES PRIOR TO MEALS TWICE DAILY.  FOLLOW UP IN 4 MOS.  Next colonoscopy in 5-10 years.

## 2018-02-08 NOTE — Telephone Encounter (Signed)
Reminder in epic °

## 2018-02-11 ENCOUNTER — Ambulatory Visit (INDEPENDENT_AMBULATORY_CARE_PROVIDER_SITE_OTHER): Payer: PRIVATE HEALTH INSURANCE | Admitting: Family Medicine

## 2018-02-11 VITALS — BP 118/73 | HR 99 | Temp 98.4°F | Ht 64.0 in | Wt 250.0 lb

## 2018-02-11 DIAGNOSIS — F3341 Major depressive disorder, recurrent, in partial remission: Secondary | ICD-10-CM

## 2018-02-11 DIAGNOSIS — F410 Panic disorder [episodic paroxysmal anxiety] without agoraphobia: Secondary | ICD-10-CM

## 2018-02-11 DIAGNOSIS — Z2821 Immunization not carried out because of patient refusal: Secondary | ICD-10-CM | POA: Diagnosis not present

## 2018-02-11 MED ORDER — ESCITALOPRAM OXALATE 20 MG PO TABS: 20.0000 mg | ORAL_TABLET | Freq: Every day | ORAL | 1 refills | Status: DC

## 2018-02-11 MED ORDER — BUPROPION HCL ER (XL) 150 MG PO TB24: 150.0000 mg | ORAL_TABLET | Freq: Every day | ORAL | 1 refills | Status: DC

## 2018-02-11 MED ORDER — CLONAZEPAM 0.5 MG PO TABS: ORAL_TABLET | ORAL | 2 refills | Status: DC

## 2018-02-11 NOTE — Telephone Encounter (Signed)
LMOM for a return call. Mailing a letter to call also.

## 2018-02-11 NOTE — Progress Notes (Signed)
Subjective: OA:CZYSAYTKZS/ GAD/ unintended weight loss PCP: [name], DO WFU:XNATF Erin Hale is a 62 y.o. female presenting to clinic today for:  1. Depressive disorder/anxiety History: long-standing history of depression, diagnosed over 20 years ago.   Patient reports that she is overall doing ok.  She has reports compliance w/ Lexapro 20 mg, Wellbutrin 150 mg and Klonopin 0.5 mg twice daily as needed.  Denies excessive sedation, falls or respiratory depression from medications.  She is currently in the process of obtaining disability.   ROS: Per HPI  No Known Allergies Past Medical History:  Diagnosis Date  . Bronchial asthma   . Depression   . GERD (gastroesophageal reflux disease)   . Hypothyroidism   . Panic disorder   . Thyroid disease    hypothyroidism    Current Outpatient Medications:  .  acetaminophen (TYLENOL) 650 MG CR tablet, Take 650 mg by mouth every 8 (eight) hours as needed for pain., Disp: , Rfl:  .  albuterol (PROVENTIL HFA;VENTOLIN HFA) 108 (90 Base) MCG/ACT inhaler, Inhale 2 puffs into the lungs every 6 (six) hours as needed for wheezing or shortness of breath., Disp: 1 Inhaler, Rfl: 0 .  buPROPion (WELLBUTRIN XL) 150 MG 24 hr tablet, TAKE 1 TABLET BY MOUTH ONCE DAILY (Patient taking differently: Take 150 mg by mouth daily. ), Disp: 30 tablet, Rfl: 0 .  clonazePAM (KLONOPIN) 0.5 MG tablet, TAKE 1 TABLET BY MOUTH TWICE DAILY AS NEEDED FOR ANXIETY (PANIC) (Patient taking differently: Take 0.5 mg by mouth 2 (two) times daily. ), Disp: 60 tablet, Rfl: 2 .  diphenhydrAMINE (BENADRYL) 25 mg capsule, Take 25 mg by mouth every 6 (six) hours as needed., Disp: , Rfl:  .  escitalopram (LEXAPRO) 20 MG tablet, TAKE 1 TABLET BY MOUTH ONCE DAILY (Patient taking differently: Take 20 mg by mouth daily. ), Disp: 30 tablet, Rfl: 0 .  levothyroxine (SYNTHROID, LEVOTHROID) 75 MCG tablet, TAKE 1 TABLET BY MOUTH ONCE DAILY BEFORE BREAKFAST (Patient taking differently: Take  75 mcg by mouth daily before breakfast. ), Disp: 30 tablet, Rfl: 4 .  Melatonin 5 MG CAPS, Take 5 mg by mouth. , Disp: , Rfl:  .  pantoprazole (PROTONIX) 40 MG tablet, Take 1 tablet (40 mg total) by mouth daily. Take 30 minutes before breakfast, Disp: 90 tablet, Rfl: 3 .  simethicone (MYLICON) 573 MG chewable tablet, Chew 250 mg by mouth as needed for flatulence., Disp: , Rfl:  Social History   Socioeconomic History  . Marital status: Divorced    Spouse name: Not on file  . Number of children: Not on file  . Years of education: Not on file  . Highest education level: Not on file  Occupational History  . Not on file  Social Needs  . Financial resource strain: Not on file  . Food insecurity:    Worry: Not on file    Inability: Not on file  . Transportation needs:    Medical: Not on file    Non-medical: Not on file  Tobacco Use  . Smoking status: Former Smoker    Types: Cigarettes    Last attempt to quit: 03/15/1994    Years since quitting: 23.9  . Smokeless tobacco: Never Used  Substance and Sexual Activity  . Alcohol use: No  . Drug use: No  . Sexual activity: Not on file  Lifestyle  . Physical activity:    Days per week: Not on file    Minutes per session: Not on file  .  Stress: Not on file  Relationships  . Social connections:    Talks on phone: Not on file    Gets together: Not on file    Attends religious service: Not on file    Active member of club or organization: Not on file    Attends meetings of clubs or organizations: Not on file    Relationship status: Not on file  . Intimate partner violence:    Fear of current or ex partner: Not on file    Emotionally abused: Not on file    Physically abused: Not on file    Forced sexual activity: Not on file  Other Topics Concern  . Not on file  Social History Narrative  . Not on file   Family History  Problem Relation Age of Onset  . Stroke Mother   . Congestive Heart Failure Mother   . Diabetes Father   .  Cancer Father        brain  . Colon cancer Neg Hx   . Colon polyps Neg Hx     Objective: Office vital signs reviewed. BP 118/73   Pulse 99   Temp 98.4 F (36.9 C) (Oral)   Ht 5\' 4"  (1.626 m)   Wt 250 lb (113.4 kg)   LMP  (LMP Unknown)   BMI 42.91 kg/m   Physical Examination:  General: Awake, alert, obese, No acute distress HEENT: Normal, sclera white, MMM Psych: mood stable, speech normal, affect appropriate, good eye contact, pleasant and interactive  Depression screen [facility] 2/9 02/11/2018 09/17/2017 08/28/2017  Decreased Interest 1 2 1   Down, Depressed, Hopeless 1 2 0  PHQ - 2 Score 2 4 1   Altered sleeping 2 1 -  Tired, decreased energy 1 2 -  Change in appetite 2 2 -  Feeling bad or failure about yourself  1 2 -  Trouble concentrating 0 1 -  Moving slowly or fidgety/restless 1 1 -  Suicidal thoughts 0 0 -  PHQ-9 Score 9 13 -  Difficult doing work/chores Somewhat difficult Somewhat difficult -  Some recent data might be hidden   GAD 7 : Generalized Anxiety Score 02/11/2018 09/17/2017 05/02/2016 09/21/2015  Nervous, Anxious, on Edge 1 2 1 2   Control/stop worrying 1 2 0 2  Worry too much - different things 1 2 0 1  Trouble relaxing 1 1 1 1   Restless 1 0 0 1  Easily annoyed or irritable 0 1 0 1  Afraid - awful might happen 1 2 0 1  Total GAD 7 Score 6 10 2 9   Anxiety Difficulty Not difficult at all Somewhat difficult Not difficult at all Somewhat difficult    Assessment/ Plan: 62 y.o. female   1. Recurrent major depressive disorder, in partial remission ([place]) Patient with a reduced PHQ 9 score and gad 7 score compared to previous visit.  She is currently on Lexapro 20 mg, Wellbutrin XL 150 and Klonopin.  Continue current therapies.  No adverse side effects.  Use appears appropriate. - buPROPion (WELLBUTRIN XL) 150 MG 24 hr tablet; Take 1 tablet (150 mg total) by mouth daily.  Dispense: 90 tablet; Refill: 1 - escitalopram (LEXAPRO) 20 MG tablet; Take 1 tablet (20 mg  total) by mouth daily.  Dispense: 90 tablet; Refill: 1 - clonazePAM (KLONOPIN) 0.5 MG tablet; TAKE 1 TABLET BY MOUTH TWICE DAILY AS NEEDED FOR ANXIETY (PANIC)  Dispense: 60 tablet; Refill: 2  2. Panic attack As above. - clonazePAM (KLONOPIN) 0.5 MG tablet; TAKE 1  TABLET BY MOUTH TWICE DAILY AS NEEDED FOR ANXIETY (PANIC)  Dispense: 60 tablet; Refill: 2  The Narcotic Database has been reviewed.  There were no red flags.     No orders of the defined types were placed in this encounter.  Meds ordered this encounter  Medications  . buPROPion (WELLBUTRIN XL) 150 MG 24 hr tablet    Sig: Take 1 tablet (150 mg total) by mouth daily.    Dispense:  90 tablet    Refill:  1    Please consider 90 day supplies to promote better adherence  . escitalopram (LEXAPRO) 20 MG tablet    Sig: Take 1 tablet (20 mg total) by mouth daily.    Dispense:  90 tablet    Refill:  1    Please consider 90 day supplies to promote better adherence  . clonazePAM (KLONOPIN) 0.5 MG tablet    Sig: TAKE 1 TABLET BY MOUTH TWICE DAILY AS NEEDED FOR ANXIETY (PANIC)    Dispense:  60 tablet    Refill:  2    Please cancel any additional fills under [facility] name and replace with this prescription     Marci Polito Windell Moulding, [place] [phone]

## 2018-02-11 NOTE — Telephone Encounter (Signed)
Pt called and was informed of results.  

## 2018-02-12 ENCOUNTER — Other Ambulatory Visit: Payer: Self-pay | Admitting: *Deleted

## 2018-02-12 DIAGNOSIS — E039 Hypothyroidism, unspecified: Secondary | ICD-10-CM

## 2018-02-12 MED ORDER — LEVOTHYROXINE SODIUM 75 MCG PO TABS: ORAL_TABLET | ORAL | 1 refills | Status: DC

## 2018-03-21 ENCOUNTER — Ambulatory Visit: Payer: No Typology Code available for payment source | Admitting: Gastroenterology

## 2018-05-13 ENCOUNTER — Encounter: Payer: Self-pay | Admitting: Family Medicine

## 2018-05-13 ENCOUNTER — Ambulatory Visit (INDEPENDENT_AMBULATORY_CARE_PROVIDER_SITE_OTHER): Payer: PRIVATE HEALTH INSURANCE | Admitting: Family Medicine

## 2018-05-13 VITALS — BP 119/78 | HR 87 | Temp 96.9°F | Ht 64.0 in | Wt 258.0 lb

## 2018-05-13 DIAGNOSIS — F3341 Major depressive disorder, recurrent, in partial remission: Secondary | ICD-10-CM

## 2018-05-13 DIAGNOSIS — F410 Panic disorder [episodic paroxysmal anxiety] without agoraphobia: Secondary | ICD-10-CM

## 2018-05-13 DIAGNOSIS — E039 Hypothyroidism, unspecified: Secondary | ICD-10-CM

## 2018-05-13 MED ORDER — CLONAZEPAM 0.5 MG PO TABS: ORAL_TABLET | ORAL | 2 refills | Status: DC

## 2018-05-13 NOTE — Patient Instructions (Signed)
You had labs performed today.  You will be contacted with the results of the labs once they are available, usually in the next 3 business days for routine lab work.  If you had a pap smear or biopsy performed, expect to be contacted in about 7-10 days.  

## 2018-05-13 NOTE — Progress Notes (Signed)
Subjective: XI:PJASNKNLZJ/ GAD PCP: [name], DO QBH:ALPFX E Brossard is a 63 y.o. female presenting to clinic today for:  1. Depressive disorder/anxiety History: long-standing history of depression, diagnosed over 20 years ago.    Patient reports that she is overall doing well. She has had some increased stress releated to applying for disability.  She has a phone appt w/ [facility] later this month..  She has reports compliance w/ Lexapro 20 mg, Wellbutrin 150 mg and Klonopin 0.5 mg twice daily as needed.  Denies excessive sedation, falls or respiratory problems.  2. Hypothyroidism History:  Dx several years ago.  Family history of thyroid disorder in her mother  She reports compliance with her Synthroid.  She notes alternating diarrhea and constipation.  No tremor.  She does have intermittent heart palpitations that seem to be more prominent in the morning but are never sustained.  Denies any dizziness, loss of consciousness or shortness of breath associated with this.   ROS: Per HPI  No Known Allergies Past Medical History:  Diagnosis Date  . Bronchial asthma   . Depression   . GERD (gastroesophageal reflux disease)   . Hypothyroidism   . Panic disorder   . Thyroid disease    hypothyroidism    Current Outpatient Medications:  .  acetaminophen (TYLENOL) 650 MG CR tablet, Take 650 mg by mouth every 8 (eight) hours as needed for pain., Disp: , Rfl:  .  albuterol (PROVENTIL HFA;VENTOLIN HFA) 108 (90 Base) MCG/ACT inhaler, Inhale 2 puffs into the lungs every 6 (six) hours as needed for wheezing or shortness of breath., Disp: 1 Inhaler, Rfl: 0 .  buPROPion (WELLBUTRIN XL) 150 MG 24 hr tablet, Take 1 tablet (150 mg total) by mouth daily., Disp: 90 tablet, Rfl: 1 .  clonazePAM (KLONOPIN) 0.5 MG tablet, TAKE 1 TABLET BY MOUTH TWICE DAILY AS NEEDED FOR ANXIETY (PANIC), Disp: 60 tablet, Rfl: 2 .  diphenhydrAMINE (BENADRYL) 25 mg capsule, Take 25 mg by mouth every 6 (six) hours as  needed., Disp: , Rfl:  .  escitalopram (LEXAPRO) 20 MG tablet, Take 1 tablet (20 mg total) by mouth daily., Disp: 90 tablet, Rfl: 1 .  levothyroxine (SYNTHROID, LEVOTHROID) 75 MCG tablet, TAKE 1 TABLET BY MOUTH ONCE DAILY BEFORE BREAKFAST, Disp: 90 tablet, Rfl: 1 .  Melatonin 5 MG CAPS, Take 5 mg by mouth. , Disp: , Rfl:  .  pantoprazole (PROTONIX) 40 MG tablet, Take 1 tablet (40 mg total) by mouth daily. Take 30 minutes before breakfast, Disp: 90 tablet, Rfl: 3 .  simethicone (MYLICON) 902 MG chewable tablet, Chew 250 mg by mouth as needed for flatulence., Disp: , Rfl:  Social History   Socioeconomic History  . Marital status: Divorced    Spouse name: Not on file  . Number of children: Not on file  . Years of education: Not on file  . Highest education level: Not on file  Occupational History  . Not on file  Social Needs  . Financial resource strain: Not on file  . Food insecurity:    Worry: Not on file    Inability: Not on file  . Transportation needs:    Medical: Not on file    Non-medical: Not on file  Tobacco Use  . Smoking status: Former Smoker    Types: Cigarettes    Last attempt to quit: 03/15/1994    Years since quitting: 24.1  . Smokeless tobacco: Never Used  Substance and Sexual Activity  . Alcohol use: No  .  Drug use: No  . Sexual activity: Not on file  Lifestyle  . Physical activity:    Days per week: Not on file    Minutes per session: Not on file  . Stress: Not on file  Relationships  . Social connections:    Talks on phone: Not on file    Gets together: Not on file    Attends religious service: Not on file    Active member of club or organization: Not on file    Attends meetings of clubs or organizations: Not on file    Relationship status: Not on file  . Intimate partner violence:    Fear of current or ex partner: Not on file    Emotionally abused: Not on file    Physically abused: Not on file    Forced sexual activity: Not on file  Other Topics  Concern  . Not on file  Social History Narrative  . Not on file   Family History  Problem Relation Age of Onset  . Stroke Mother   . Congestive Heart Failure Mother   . Diabetes Father   . Cancer Father        brain  . Colon cancer Neg Hx   . Colon polyps Neg Hx     Objective: Office vital signs reviewed. BP 119/78   Pulse 87   Temp (!) 96.9 F (36.1 C) (Oral)   Ht _0  (1.626 m)   Wt 258 lb (117 kg)   LMP  (LMP Unknown)   BMI 44.29 kg/m   Physical Examination:  General: Awake, alert, obese, No acute distress HEENT: Normal, sclera white, MMM Cardio: RRR, S1S2 heard, no murmurs Pulm: CTAB, normal WOB on room air. No wheeze, rhonchi or rales Psych: mood stable, speech normal, affect appropriate, good eye contact, pleasant and interactive. Does not appear to be responding to internal stimuli  Depression screen [facility] 2/9 02/11/2018 09/17/2017 08/28/2017  Decreased Interest _1 Down, Depressed, Hopeless 1 2 0  PHQ - 2 Score _2 Altered sleeping 2 1 -  Tired, decreased energy 1 2 -  Change in appetite 2 2 -  Feeling bad or failure about yourself  1 2 -  Trouble concentrating 0 1 -  Moving slowly or fidgety/restless 1 1 -  Suicidal thoughts 0 0 -  PHQ-9 Score 9 13 -  Difficult doing work/chores Somewhat difficult Somewhat difficult -  Some recent data might be hidden   GAD 7 : Generalized Anxiety Score 02/11/2018 09/17/2017 05/02/2016 09/21/2015  Nervous, Anxious, on Edge _3 Control/stop worrying 1 2 0 2  Worry too much - different things 1 2 0 1  Trouble relaxing _4 Restless 1 0 0 1  Easily annoyed or irritable 0 1 0 1  Afraid - awful might happen 1 2 0 1  Total GAD 7 Score _5 Anxiety Difficulty Not difficult at all Somewhat difficult Not difficult at all Somewhat difficult    Assessment/ Plan: 63 y.o. female   1. Panic attack Controlled with Klonopin.  The national narcotic database was reviewed and last refill was 04/12/2018.  No red flag  signs.  Controlled substance contract reviewed and renewed today.  Check CMP - clonazePAM (KLONOPIN) 0.5 MG tablet; TAKE 1 TABLET BY MOUTH TWICE DAILY AS NEEDED FOR ANXIETY (PANIC)  Dispense: 60 tablet; Refill: 2 - CMP14+EGFR  2. Recurrent major depressive disorder, in partial remission (  [place]) Controlled with SSRI and Wellbutrin. - clonazePAM (KLONOPIN) 0.5 MG tablet; TAKE 1 TABLET BY MOUTH TWICE DAILY AS NEEDED FOR ANXIETY (PANIC)  Dispense: 60 tablet; Refill: 2 - CMP14+EGFR  3. Acquired hypothyroidism Last thyroid panel in July 2019 was stable.  Recheck today.  She continues to have alternating diarrhea and constipation.  Difficulty telling if this is related to underlying GI issues versus have been flow of thyroid levels - CMP14+EGFR - Thyroid Panel With TSH  4. Severe obesity (BMI >= 40) (HCC) Patient is fasting. - CMP14+EGFR - Lipid Panel   Orders Placed This Encounter  Procedures  . CMP14+EGFR  . Lipid Panel  . Thyroid Panel With TSH   Meds ordered this encounter  Medications  . clonazePAM (KLONOPIN) 0.5 MG tablet    Sig: TAKE 1 TABLET BY MOUTH TWICE DAILY AS NEEDED FOR ANXIETY (PANIC)    Dispense:  60 tablet    Refill:  2      Windell Moulding, DO [place] [phone]

## 2018-05-14 LAB — CMP14+EGFR
A/G RATIO: 1.8 (ref 1.2–2.2)
ALT: 20 IU/L (ref 0–32)
AST: 20 IU/L (ref 0–40)
Albumin: 3.9 g/dL (ref 3.8–4.8)
Alkaline Phosphatase: 109 IU/L (ref 39–117)
BUN/Creatinine Ratio: 17 (ref 12–28)
BUN: 16 mg/dL (ref 8–27)
Bilirubin Total: 0.3 mg/dL (ref 0.0–1.2)
CALCIUM: 8.8 mg/dL (ref 8.7–10.3)
CO2: 22 mmol/L (ref 20–29)
CREATININE: 0.92 mg/dL (ref 0.57–1.00)
Chloride: 107 mmol/L — ABNORMAL HIGH (ref 96–106)
GFR, EST AFRICAN AMERICAN: 77 mL/min/{1.73_m2} (ref >59)
GFR, EST NON AFRICAN AMERICAN: 67 mL/min/{1.73_m2} (ref >59)
Globulin, Total: 2.2 g/dL (ref 1.5–4.5)
Glucose: 101 mg/dL — ABNORMAL HIGH (ref 65–99)
POTASSIUM: 4.2 mmol/L (ref 3.5–5.2)
Sodium: 144 mmol/L (ref 134–144)
TOTAL PROTEIN: 6.1 g/dL (ref 6.0–8.5)

## 2018-05-14 LAB — LIPID PANEL
CHOL/HDL RATIO: 3.4 ratio (ref 0.0–4.4)
Cholesterol, Total: 229 mg/dL — ABNORMAL HIGH (ref 100–199)
HDL: 68 mg/dL (ref >39)
LDL CALC: 140 mg/dL — AB (ref 0–99)
Triglycerides: 106 mg/dL (ref 0–149)
VLDL Cholesterol Cal: 21 mg/dL (ref 5–40)

## 2018-05-14 LAB — THYROID PANEL WITH TSH
FREE THYROXINE INDEX: 2.3 (ref 1.2–4.9)
T3 Uptake Ratio: 28 % (ref 24–39)
T4, Total: 8.2 ug/dL (ref 4.5–12.0)
TSH: 4.52 u[IU]/mL — AB (ref 0.450–4.500)

## 2018-05-20 ENCOUNTER — Ambulatory Visit: Payer: No Typology Code available for payment source | Admitting: Gastroenterology

## 2018-07-25 ENCOUNTER — Ambulatory Visit: Payer: No Typology Code available for payment source | Admitting: Gastroenterology

## 2018-07-31 ENCOUNTER — Encounter: Payer: Self-pay | Admitting: Gastroenterology

## 2018-08-10 ENCOUNTER — Other Ambulatory Visit: Payer: Self-pay | Admitting: Family Medicine

## 2018-08-10 DIAGNOSIS — F3341 Major depressive disorder, recurrent, in partial remission: Secondary | ICD-10-CM

## 2018-08-10 DIAGNOSIS — F410 Panic disorder [episodic paroxysmal anxiety] without agoraphobia: Secondary | ICD-10-CM

## 2018-08-12 ENCOUNTER — Other Ambulatory Visit: Payer: Self-pay | Admitting: Family Medicine

## 2018-08-12 DIAGNOSIS — F3341 Major depressive disorder, recurrent, in partial remission: Secondary | ICD-10-CM

## 2018-08-12 NOTE — Telephone Encounter (Signed)
Please call for appt for refills on controlled substance.  Ok to put as e-visit if does not want to come in.

## 2018-08-13 ENCOUNTER — Other Ambulatory Visit: Payer: Self-pay

## 2018-08-14 ENCOUNTER — Encounter: Payer: Self-pay | Admitting: Family Medicine

## 2018-08-14 ENCOUNTER — Ambulatory Visit (INDEPENDENT_AMBULATORY_CARE_PROVIDER_SITE_OTHER): Payer: PRIVATE HEALTH INSURANCE | Admitting: Family Medicine

## 2018-08-14 DIAGNOSIS — E039 Hypothyroidism, unspecified: Secondary | ICD-10-CM

## 2018-08-14 DIAGNOSIS — F410 Panic disorder [episodic paroxysmal anxiety] without agoraphobia: Secondary | ICD-10-CM

## 2018-08-14 DIAGNOSIS — F3341 Major depressive disorder, recurrent, in partial remission: Secondary | ICD-10-CM | POA: Diagnosis not present

## 2018-08-14 MED ORDER — ESCITALOPRAM OXALATE 20 MG PO TABS: 20.0000 mg | ORAL_TABLET | Freq: Every day | ORAL | 1 refills | Status: DC

## 2018-08-14 MED ORDER — CLONAZEPAM 0.5 MG PO TABS: ORAL_TABLET | ORAL | 2 refills | Status: DC

## 2018-08-14 MED ORDER — LEVOTHYROXINE SODIUM 75 MCG PO TABS: ORAL_TABLET | ORAL | 1 refills | Status: DC

## 2018-08-14 NOTE — Progress Notes (Signed)
Telephone visit  Subjective: CC: Follow-up panic attacks, hypothyroidism PCP: [name], DO NTZ:GYFVC Erin Hale is a 63 y.o. female calls for telephone consult today. Patient provides verbal consent for consult held via phone.  Location of patient: home Location of provider: [facility] Others present for call: none  1.  Depression/anxiety/panic attack Reports some anxiety and panic with relation to COVID-19 outbreak.  She is compliant with Wellbutrin 150 mg daily, Lexapro 20 mg daily and Klonopin.  Denies excessive sedation, respiratory depression, confusion or falls with Klonopin.  2.  Hypothyroidism TSH noted to be slightly above goal at last visit.  Patient reports compliance with Synthroid.  She denies any heart palpitations, change in voice or difficulty swallowing.   ROS: Per HPI  No Known Allergies Past Medical History:  Diagnosis Date  . Bronchial asthma   . Depression   . GERD (gastroesophageal reflux disease)   . Hypothyroidism   . Panic disorder   . Thyroid disease    hypothyroidism    Current Outpatient Medications:  .  acetaminophen (TYLENOL) 650 MG CR tablet, Take 650 mg by mouth every 8 (eight) hours as needed for pain., Disp: , Rfl:  .  albuterol (PROVENTIL HFA;VENTOLIN HFA) 108 (90 Base) MCG/ACT inhaler, Inhale 2 puffs into the lungs every 6 (six) hours as needed for wheezing or shortness of breath., Disp: 1 Inhaler, Rfl: 0 .  buPROPion (WELLBUTRIN XL) 150 MG 24 hr tablet, Take 1 tablet by mouth once daily, Disp: 90 tablet, Rfl: 0 .  clonazePAM (KLONOPIN) 0.5 MG tablet, TAKE 1 TABLET BY MOUTH TWICE DAILY AS NEEDED FOR ANXIETY (PANIC), Disp: 60 tablet, Rfl: 2 .  diphenhydrAMINE (BENADRYL) 25 mg capsule, Take 25 mg by mouth every 6 (six) hours as needed., Disp: , Rfl:  .  escitalopram (LEXAPRO) 20 MG tablet, Take 1 tablet (20 mg total) by mouth daily., Disp: 90 tablet, Rfl: 1 .  levothyroxine (SYNTHROID, LEVOTHROID) 75 MCG tablet, TAKE 1 TABLET BY MOUTH ONCE  DAILY BEFORE BREAKFAST, Disp: 90 tablet, Rfl: 1 .  Melatonin 5 MG CAPS, Take 5 mg by mouth. , Disp: , Rfl:  .  pantoprazole (PROTONIX) 40 MG tablet, Take 1 tablet (40 mg total) by mouth daily. Take 30 minutes before breakfast, Disp: 90 tablet, Rfl: 3 .  simethicone (MYLICON) 944 MG chewable tablet, Chew 250 mg by mouth as needed for flatulence., Disp: , Rfl:   Depression screen [facility] 2/9 08/14/2018 05/13/2018 02/11/2018  Decreased Interest 1 1 1   Down, Depressed, Hopeless 1 1 1   PHQ - 2 Score 2 2 2   Altered sleeping 1 2 2   Tired, decreased energy 0 2 1  Change in appetite 2 2 2   Feeling bad or failure about yourself  0 1 1  Trouble concentrating 0 0 0  Moving slowly or fidgety/restless 0 1 1  Suicidal thoughts 0 0 0  PHQ-9 Score 5 10 9   Difficult doing work/chores - Somewhat difficult Somewhat difficult  Some recent data might be hidden   GAD 7 : Generalized Anxiety Score 08/14/2018 05/13/2018 02/11/2018 09/17/2017  Nervous, Anxious, on Edge 1 1 1 2   Control/stop worrying 1 2 1 2   Worry too much - different things 1 2 1 2   Trouble relaxing 1 1 1 1   Restless 0 0 1 0  Easily annoyed or irritable 1 1 0 1  Afraid - awful might happen 1 1 1 2   Total GAD 7 Score 6 8 6 10   Anxiety Difficulty Not difficult at  all Not difficult at all Not difficult at all Somewhat difficult      Assessment/ Plan: 63 y.o. female   1. Recurrent major depressive disorder, in partial remission ([place]) Controlled.  Continue Lexapro, Wellbutrin and Klonopin as needed.  National cardiac database was reviewed and there were no red flags.  Okay to go ahead and proceed with refill.  We have scheduled a 25-month follow-up and patient is aware of date and time.  Plan for labs at that visit given slightly abnormal thyroid panel on last visit - clonazePAM (KLONOPIN) 0.5 MG tablet; TAKE 1 TABLET BY MOUTH TWICE DAILY AS NEEDED FOR ANXIETY (PANIC)  Dispense: 60 tablet; Refill: 2 - escitalopram (LEXAPRO) 20 MG tablet; Take 1 tablet  (20 mg total) by mouth daily.  Dispense: 90 tablet; Refill: 1  2. Panic attack As above - clonazePAM (KLONOPIN) 0.5 MG tablet; TAKE 1 TABLET BY MOUTH TWICE DAILY AS NEEDED FOR ANXIETY (PANIC)  Dispense: 60 tablet; Refill: 2  3. Hypothyroidism, unspecified type - levothyroxine (SYNTHROID) 75 MCG tablet; TAKE 1 TABLET BY MOUTH ONCE DAILY BEFORE BREAKFAST  Dispense: 90 tablet; Refill: 1   Start time: 2:01pm End time: 2:09p  Total time spent on patient care (including telephone call/ virtual visit): 15 minutes  [place], [place] [phone]

## 2018-08-28 IMAGING — DX DG ABDOMEN 1V
2 series · 2 of 2 positions shown · non-contrast
Comparison: 11/14/2016

CLINICAL DATA: Abdominal pain and constipation

EXAM:
ABDOMEN - 1 VIEW

[abdomen kub (1 of 2)]
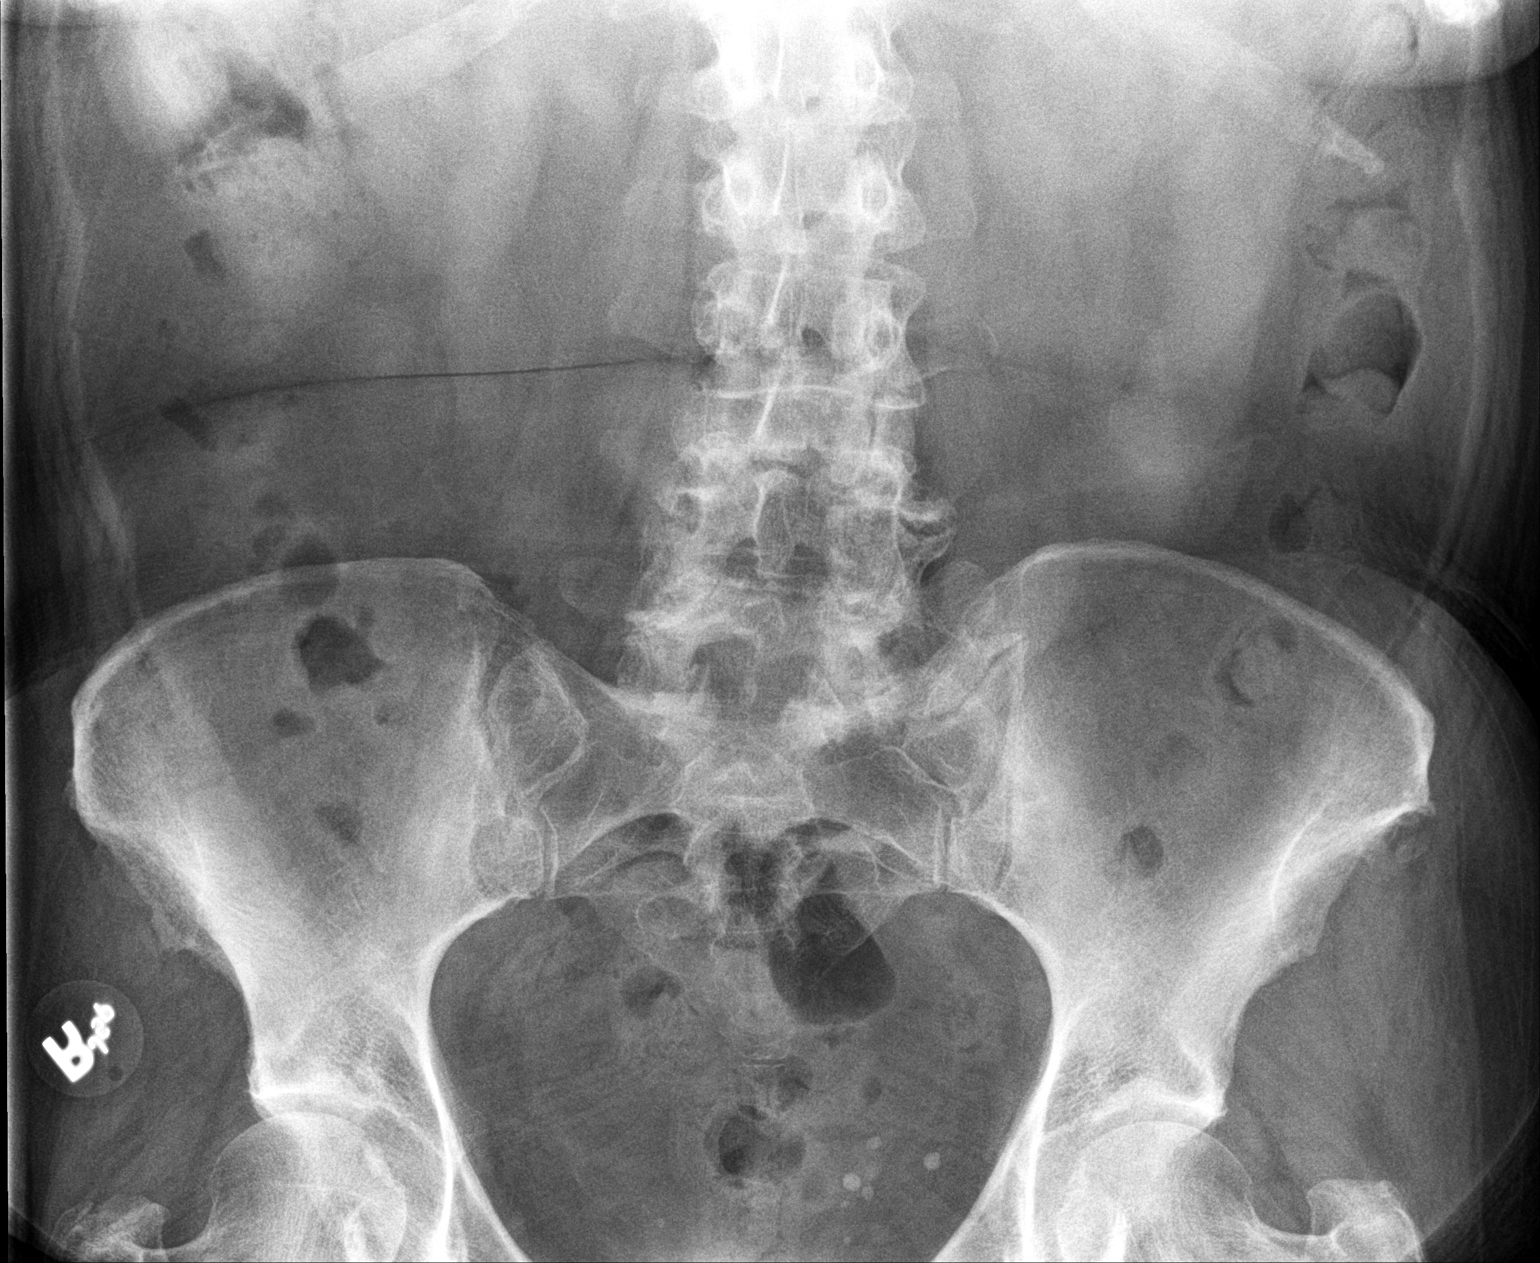

[abdomen kub (2 of 2)]
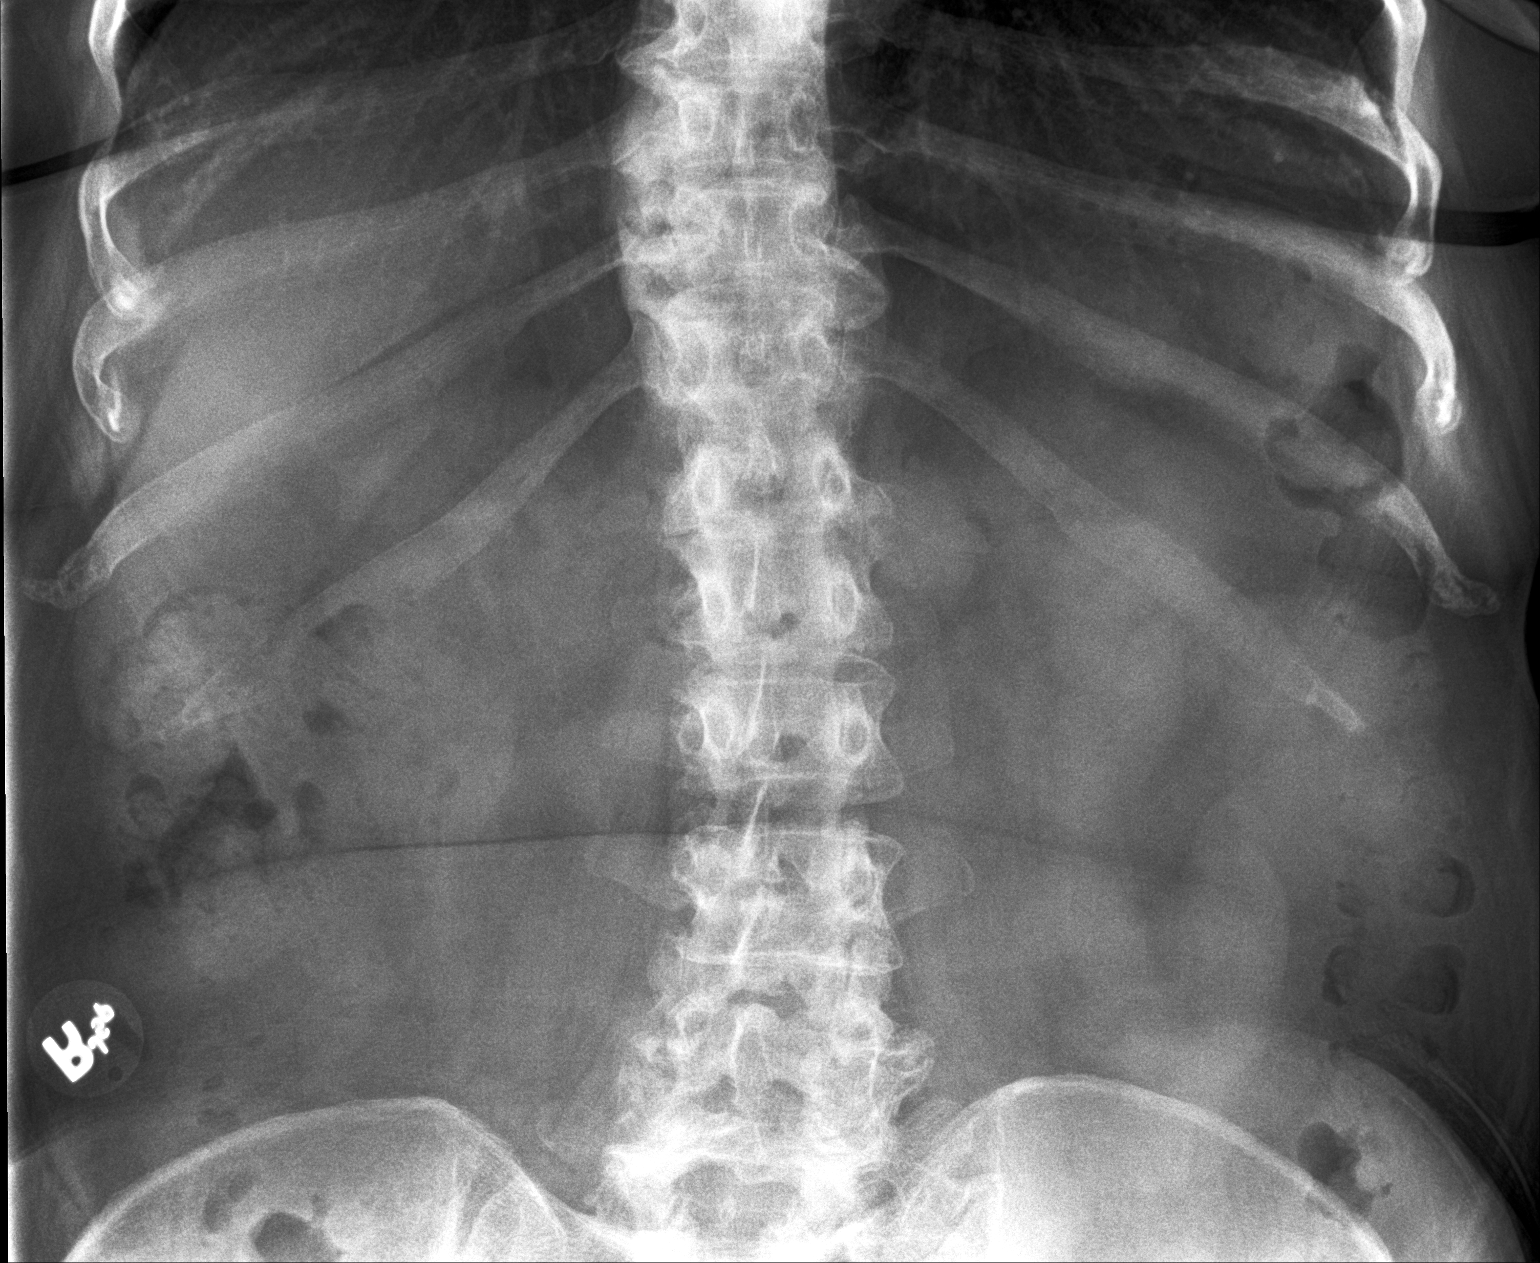

[2 of 2 positions shown; findings below may reference images not displayed]

FINDINGS: Mild to moderate stool volume without obstruction or rectal
impaction. No evidence of small bowel obstruction. No concerning
mass effect or calcification. Pelvic calcifications are stable from
3451 and likely phleboliths. Lumbar spine degeneration with mild
levoscoliosis. Bulky endplate spurring at the lower thoracic spine.
IMPRESSION: Normal bowel gas pattern.  No excessive stool retention.

## 2018-09-13 ENCOUNTER — Ambulatory Visit: Payer: No Typology Code available for payment source | Admitting: Gastroenterology

## 2018-10-09 ENCOUNTER — Ambulatory Visit: Payer: No Typology Code available for payment source | Admitting: Gastroenterology

## 2018-10-28 ENCOUNTER — Ambulatory Visit: Payer: Self-pay | Admitting: Family Medicine

## 2018-11-10 ENCOUNTER — Other Ambulatory Visit: Payer: Self-pay | Admitting: Family Medicine

## 2018-11-10 DIAGNOSIS — F3341 Major depressive disorder, recurrent, in partial remission: Secondary | ICD-10-CM

## 2018-11-18 ENCOUNTER — Other Ambulatory Visit: Payer: Self-pay

## 2018-11-19 ENCOUNTER — Encounter: Payer: Self-pay | Admitting: Family Medicine

## 2018-11-19 ENCOUNTER — Ambulatory Visit (INDEPENDENT_AMBULATORY_CARE_PROVIDER_SITE_OTHER): Payer: Medicaid Other | Admitting: Family Medicine

## 2018-11-19 DIAGNOSIS — F410 Panic disorder [episodic paroxysmal anxiety] without agoraphobia: Secondary | ICD-10-CM

## 2018-11-19 DIAGNOSIS — F3341 Major depressive disorder, recurrent, in partial remission: Secondary | ICD-10-CM | POA: Diagnosis not present

## 2018-11-19 DIAGNOSIS — E039 Hypothyroidism, unspecified: Secondary | ICD-10-CM

## 2018-11-19 DIAGNOSIS — J410 Simple chronic bronchitis: Secondary | ICD-10-CM | POA: Diagnosis not present

## 2018-11-19 MED ORDER — CLONAZEPAM 0.5 MG PO TABS: ORAL_TABLET | ORAL | 2 refills | Status: DC

## 2018-11-19 MED ORDER — ALBUTEROL SULFATE HFA 108 (90 BASE) MCG/ACT IN AERS: 2.0000 | INHALATION_SPRAY | Freq: Four times a day (QID) | RESPIRATORY_TRACT | 0 refills | Status: DC | PRN

## 2018-11-19 NOTE — Progress Notes (Signed)
Telephone visit  Subjective: CC: Follow-up GAD/ panic attacks, hypothyroidism PCP: [name], DO [id number] Erin Hale is a 63 y.o. female calls for telephone consult today. Patient provides verbal consent for consult held via phone.  Location of patient: home Location of provider: [facility] Others present for call: none  1.  Depression/anxiety/panic attack She notes stability of symptoms since last visit but does state that she does seem to stress eat quite a bit and has "not been eating the right things".  She is compliant with Wellbutrin 150 mg daily, Lexapro 20 mg daily and Klonopin.  No falls, difficulty breathing, mental status changes.  2.  Hypothyroidism Patient reports compliance with Synthroid.  Denies any tremor, heart palpitations, unplanned weight loss or weight gain.  She does report changes in bowel habits intermittently but relates this to what she is eating.   ROS: Per HPI  No Known Allergies Past Medical History:  Diagnosis Date  . Bronchial asthma   . Depression   . GERD (gastroesophageal reflux disease)   . Hypothyroidism   . Panic disorder   . Thyroid disease    hypothyroidism    Current Outpatient Medications:  .  acetaminophen (TYLENOL) 650 MG CR tablet, Take 650 mg by mouth every 8 (eight) hours as needed for pain., Disp: , Rfl:  .  albuterol (PROVENTIL HFA;VENTOLIN HFA) 108 (90 Base) MCG/ACT inhaler, Inhale 2 puffs into the lungs every 6 (six) hours as needed for wheezing or shortness of breath., Disp: 1 Inhaler, Rfl: 0 .  buPROPion (WELLBUTRIN XL) 150 MG 24 hr tablet, Take 1 tablet by mouth once daily, Disp: 90 tablet, Rfl: 0 .  clonazePAM (KLONOPIN) 0.5 MG tablet, TAKE 1 TABLET BY MOUTH TWICE DAILY AS NEEDED FOR ANXIETY (PANIC), Disp: 60 tablet, Rfl: 2 .  diphenhydrAMINE (BENADRYL) 25 mg capsule, Take 25 mg by mouth every 6 (six) hours as needed., Disp: , Rfl:  .  escitalopram (LEXAPRO) 20 MG tablet, Take 1 tablet (20 mg total) by mouth daily., Disp:  90 tablet, Rfl: 1 .  levothyroxine (SYNTHROID) 75 MCG tablet, TAKE 1 TABLET BY MOUTH ONCE DAILY BEFORE BREAKFAST, Disp: 90 tablet, Rfl: 1 .  Melatonin 5 MG CAPS, Take 5 mg by mouth. , Disp: , Rfl:  .  pantoprazole (PROTONIX) 40 MG tablet, Take 1 tablet (40 mg total) by mouth daily. Take 30 minutes before breakfast, Disp: 90 tablet, Rfl: 3 .  simethicone (MYLICON) 0000000 MG chewable tablet, Chew 250 mg by mouth as needed for flatulence., Disp: , Rfl:   Depression screen [facility] 2/9 08/14/2018 05/13/2018 02/11/2018  Decreased Interest 1 1 1   Down, Depressed, Hopeless 1 1 1   PHQ - 2 Score 2 2 2   Altered sleeping 1 2 2   Tired, decreased energy 0 2 1  Change in appetite 2 2 2   Feeling bad or failure about yourself  0 1 1  Trouble concentrating 0 0 0  Moving slowly or fidgety/restless 0 1 1  Suicidal thoughts 0 0 0  PHQ-9 Score 5 10 9   Difficult doing work/chores - Somewhat difficult Somewhat difficult  Some recent data might be hidden   GAD 7 : Generalized Anxiety Score 08/14/2018 05/13/2018 02/11/2018 09/17/2017  Nervous, Anxious, on Edge 1 1 1 2   Control/stop worrying 1 2 1 2   Worry too much - different things 1 2 1 2   Trouble relaxing 1 1 1 1   Restless 0 0 1 0  Easily annoyed or irritable 1 1 0 1  Afraid -  awful might happen 1 1 1 2   Total GAD 7 Score 6 8 6 10   Anxiety Difficulty Not difficult at all Not difficult at all Not difficult at all Somewhat difficult      Assessment/ Plan: 63 y.o. female   1. Panic attack Stable.  The national narcotic database was reviewed and there were no red flags.  I have renewed her Klonopin.  She will follow-up in office in 3 months - clonazePAM (KLONOPIN) 0.5 MG tablet; TAKE 1 TABLET BY MOUTH TWICE DAILY AS NEEDED FOR ANXIETY (PANIC)  Dispense: 60 tablet; Refill: 2  2. Recurrent major depressive disorder, in partial remission (HCC) Stable.  Continue current regimen - clonazePAM (KLONOPIN) 0.5 MG tablet; TAKE 1 TABLET BY MOUTH TWICE DAILY AS NEEDED FOR  ANXIETY (PANIC)  Dispense: 60 tablet; Refill: 2  3. Simple chronic bronchitis ([place]) Asymptomatic but needs an albuterol on hand.  This is been renewed - albuterol (VENTOLIN HFA) 108 (90 Base) MCG/ACT inhaler; Inhale 2 puffs into the lungs every 6 (six) hours as needed for wheezing or shortness of breath.  Dispense: 18 g; Refill: 0  4. Hypothyroidism, unspecified type She has had some decreased energy and I wonder if this is reflective of underactive thyroid.  Her last TSH was not within normal range.  I advised her to come in for lab and we will adjust Synthroid if needed.  She voiced good understanding. - Thyroid Panel With TSH; Future    Start time: 1:48pm (LVM); 1:53pm End time: 2:01pm  Total time spent on patient care (including telephone call/ virtual visit): 15 minutes  [place], [place] [phone]

## 2018-12-04 ENCOUNTER — Other Ambulatory Visit: Payer: Self-pay

## 2018-12-04 ENCOUNTER — Other Ambulatory Visit: Payer: Medicaid Other

## 2018-12-04 DIAGNOSIS — E039 Hypothyroidism, unspecified: Secondary | ICD-10-CM

## 2018-12-05 LAB — THYROID PANEL WITH TSH
Free Thyroxine Index: 2.6 (ref 1.2–4.9)
T3 Uptake Ratio: 29 % (ref 24–39)
T4, Total: 9.1 ug/dL (ref 4.5–12.0)
TSH: 5.59 u[IU]/mL — ABNORMAL HIGH (ref 0.450–4.500)

## 2018-12-06 ENCOUNTER — Other Ambulatory Visit: Payer: Self-pay | Admitting: Family Medicine

## 2018-12-06 DIAGNOSIS — E039 Hypothyroidism, unspecified: Secondary | ICD-10-CM

## 2018-12-06 MED ORDER — LEVOTHYROXINE SODIUM 88 MCG PO TABS: 88.0000 ug | ORAL_TABLET | Freq: Every day | ORAL | 0 refills | Status: DC

## 2019-02-17 ENCOUNTER — Other Ambulatory Visit: Payer: Self-pay

## 2019-02-18 ENCOUNTER — Other Ambulatory Visit: Payer: Self-pay

## 2019-02-18 ENCOUNTER — Encounter: Payer: Self-pay | Admitting: Family Medicine

## 2019-02-18 ENCOUNTER — Ambulatory Visit: Payer: PRIVATE HEALTH INSURANCE | Admitting: Family Medicine

## 2019-02-18 VITALS — BP 137/82 | HR 92 | Temp 97.6°F | Ht 64.0 in | Wt 271.0 lb

## 2019-02-18 DIAGNOSIS — F4321 Adjustment disorder with depressed mood: Secondary | ICD-10-CM | POA: Diagnosis not present

## 2019-02-18 DIAGNOSIS — F410 Panic disorder [episodic paroxysmal anxiety] without agoraphobia: Secondary | ICD-10-CM | POA: Diagnosis not present

## 2019-02-18 DIAGNOSIS — R1084 Generalized abdominal pain: Secondary | ICD-10-CM | POA: Diagnosis not present

## 2019-02-18 DIAGNOSIS — Z114 Encounter for screening for human immunodeficiency virus [HIV]: Secondary | ICD-10-CM | POA: Diagnosis not present

## 2019-02-18 DIAGNOSIS — E039 Hypothyroidism, unspecified: Secondary | ICD-10-CM

## 2019-02-18 DIAGNOSIS — F3341 Major depressive disorder, recurrent, in partial remission: Secondary | ICD-10-CM

## 2019-02-18 DIAGNOSIS — R300 Dysuria: Secondary | ICD-10-CM | POA: Diagnosis not present

## 2019-02-18 LAB — URINALYSIS, COMPLETE
Bilirubin, UA: NEGATIVE
Glucose, UA: NEGATIVE
Ketones, UA: NEGATIVE
Leukocytes,UA: NEGATIVE
Nitrite, UA: NEGATIVE
Protein,UA: NEGATIVE
RBC, UA: NEGATIVE
Specific Gravity, UA: >1.03 — ABNORMAL HIGH (ref 1.005–1.030)
Urobilinogen, Ur: 0.2 mg/dL (ref 0.2–1.0)
pH, UA: 5.5 (ref 5.0–7.5)

## 2019-02-18 LAB — MICROSCOPIC EXAMINATION: RBC, Urine: NONE SEEN /hpf (ref 0–2)

## 2019-02-18 MED ORDER — CLONAZEPAM 0.5 MG PO TABS: ORAL_TABLET | ORAL | 3 refills | Status: DC

## 2019-02-18 MED ORDER — BUPROPION HCL ER (XL) 150 MG PO TB24: 150.0000 mg | ORAL_TABLET | Freq: Every day | ORAL | 0 refills | Status: DC

## 2019-02-18 MED ORDER — ESCITALOPRAM OXALATE 20 MG PO TABS: 20.0000 mg | ORAL_TABLET | Freq: Every day | ORAL | 1 refills | Status: DC

## 2019-02-18 NOTE — Patient Instructions (Addendum)
Schedule your mammogram, pap smear.  Your urine does NOT look infected.  I think the symptoms you are experiencing may be related to your hernia.  Call Dr Oneida Alar to schedule a follow up.  We talked about counseling and possibly increasing your Wellbutrin if needed in the future.  You had labs performed today.  You will be contacted with the results of the labs once they are available, usually in the next 3 business days for routine lab work.  If you have an active my chart account, they will be released to your MyChart.  If you prefer to have these labs released to you via telephone, please let us know.  If you had a pap smear or biopsy performed, expect to be contacted in about 7-10 days.

## 2019-02-18 NOTE — Progress Notes (Signed)
Subjective: CC: Follow-up anxiety, depression PCP: [name], DO [id number] E Kerper is a 63 y.o. female presenting to clinic today for:  1.  Anxiety with depression Patient was seen via virtual visit in September.  At that time, symptoms were stable.  She reports compliance with Lexapro, Wellbutrin and twice daily Klonopin.  Denies any falls, mental status changes, memory loss or respiratory depression.  She does report exacerbation of both anxiety and depression lately.  Her sister, whom she resided with, recently passed away from cardiac complications.  She is very tearful when talking about this.  She does feel that she has good family support.  She has 6 remaining siblings as well as a niece, her sister's daughter, who she can depend on to talk to.  She is planning on seeing a counselor through the church soon.  No SI, HI.  She is trying to get into a new apartment and hopes that this may help also with help some of her symptoms.  She also has a cat, her sister's cat, whom she is taking care of.  2.  Hypothyroidism Patient reports compliance with increased dose of Synthroid.  This was increased to 88 mcg last visit for TSH greater than 5.  She reports some intermittent heart fluttering.  Denies any constipation, diarrhea, unplanned weight loss.  She has gained a little bit of weight but attributes this to poor eating habits in the setting of COVID-19 isolation precautions.  3.  Abdominal pain Patient reports intermittent generalized abdominal pain that seems to be worse in the epigastric region.  She reports early satiety.  She has history of hiatal hernia and she wonders if this is worse.  She does report intermittent dysuria as well but seems better.  Symptoms have been ongoing for about a week now.  No nausea, vomiting, fevers, diarrhea, constipation, blood in stool.  She is established with Dr. Oneida Alar.  ROS: Per HPI  No Known Allergies Past Medical History:  Diagnosis Date  .  Bronchial asthma   . Depression   . GERD (gastroesophageal reflux disease)   . Hypothyroidism   . Panic disorder   . Thyroid disease    hypothyroidism    Current Outpatient Medications:  .  acetaminophen (TYLENOL) 650 MG CR tablet, Take 650 mg by mouth every 8 (eight) hours as needed for pain. , Disp: , Rfl:  .  albuterol (VENTOLIN HFA) 108 (90 Base) MCG/ACT inhaler, Inhale 2 puffs into the lungs every 6 (six) hours as needed for wheezing or shortness of breath. (Patient taking differently: Inhale 2 puffs into the lungs every 6 (six) hours as needed for wheezing or shortness of breath. ), Disp: 18 g, Rfl: 0 .  Ascorbic Acid (VITAMIN C) 100 MG tablet, Take 100 mg by mouth daily., Disp: , Rfl:  .  buPROPion (WELLBUTRIN XL) 150 MG 24 hr tablet, Take 1 tablet by mouth once daily, Disp: 90 tablet, Rfl: 0 .  diphenhydrAMINE (BENADRYL) 25 mg capsule, Take 25 mg by mouth every 6 (six) hours as needed., Disp: , Rfl:  .  levothyroxine (SYNTHROID) 88 MCG tablet, Take 1 tablet (88 mcg total) by mouth daily., Disp: 90 tablet, Rfl: 0 .  pantoprazole (PROTONIX) 40 MG tablet, Take 1 tablet (40 mg total) by mouth daily. Take 30 minutes before breakfast, Disp: 90 tablet, Rfl: 3 .  simethicone (MYLICON) 0000000 MG chewable tablet, Chew 250 mg by mouth as needed for flatulence. , Disp: , Rfl:  .  clonazePAM (KLONOPIN)  0.5 MG tablet, TAKE 1 TABLET BY MOUTH TWICE DAILY AS NEEDED FOR ANXIETY (PANIC), Disp: 60 tablet, Rfl: 3 .  escitalopram (LEXAPRO) 20 MG tablet, Take 1 tablet (20 mg total) by mouth daily., Disp: 90 tablet, Rfl: 1 .  Melatonin 5 MG CAPS, Take 5 mg by mouth. , Disp: , Rfl:  Social History   Socioeconomic History  . Marital status: Divorced    Spouse name: Not on file  . Number of children: Not on file  . Years of education: Not on file  . Highest education level: Not on file  Occupational History  . Not on file  Tobacco Use  . Smoking status: Former Smoker    Types: Cigarettes    Quit date:  03/15/1994    Years since quitting: 24.9  . Smokeless tobacco: Never Used  Substance and Sexual Activity  . Alcohol use: No  . Drug use: No  . Sexual activity: Not on file  Other Topics Concern  . Not on file  Social History Narrative  . Not on file   Social Determinants of Health   Financial Resource Strain:   . Difficulty of Paying Living Expenses: Not on file  Food Insecurity:   . Worried About Charity fundraiser in the Last Year: Not on file  . Ran Out of Food in the Last Year: Not on file  Transportation Needs:   . Lack of Transportation (Medical): Not on file  . Lack of Transportation (Non-Medical): Not on file  Physical Activity:   . Days of Exercise per Week: Not on file  . Minutes of Exercise per Session: Not on file  Stress:   . Feeling of Stress : Not on file  Social Connections:   . Frequency of Communication with Friends and Family: Not on file  . Frequency of Social Gatherings with Friends and Family: Not on file  . Attends Religious Services: Not on file  . Active Member of Clubs or Organizations: Not on file  . Attends Archivist Meetings: Not on file  . Marital Status: Not on file  Intimate Partner Violence:   . Fear of Current or Ex-Partner: Not on file  . Emotionally Abused: Not on file  . Physically Abused: Not on file  . Sexually Abused: Not on file   Family History  Problem Relation Age of Onset  . Stroke Mother   . Congestive Heart Failure Mother   . Diabetes Father   . Cancer Father        brain  . Colon cancer Neg Hx   . Colon polyps Neg Hx     Objective: Office vital signs reviewed. BP 137/82   Pulse 92   Temp 97.6 F (36.4 C) (Temporal)   Ht 5\' 4"  (1.626 m)   Wt 271 lb (122.9 kg)   LMP  (LMP Unknown)   SpO2 97%   BMI 46.52 kg/m   Physical Examination:  General: Awake, alert, obese, No acute distress HEENT: Normal; no exophthalmos.  No goiter Cardio: regular rate and rhythm, S1S2 heard, no murmurs appreciated  Pulm: clear to auscultation bilaterally, no wheezes, rhonchi or rales; normal work of breathing on room air GI: No point tenderness.  No palpable masses.  No suprapubic tenderness to palpation. Psych: Mood depressed.  Patient intermittently tearful.  Eye contact fair.  Thought process linear. Depression screen [facility] 2/9 02/18/2019 08/14/2018 05/13/2018  Decreased Interest 2 1 1   Down, Depressed, Hopeless 2 1 1   PHQ - 2 Score  4 2 2   Altered sleeping 2 1 2   Tired, decreased energy 2 0 2  Change in appetite 2 2 2   Feeling bad or failure about yourself  2 0 1  Trouble concentrating 2 0 0  Moving slowly or fidgety/restless 1 0 1  Suicidal thoughts 0 0 0  PHQ-9 Score 15 5 10   Difficult doing work/chores Somewhat difficult - Somewhat difficult  Some recent data might be hidden   GAD 7 : Generalized Anxiety Score 02/18/2019 08/14/2018 05/13/2018 02/11/2018  Nervous, Anxious, on Edge 1 1 1 1   Control/stop worrying 2 1 2 1   Worry too much - different things 2 1 2 1   Trouble relaxing 1 1 1 1   Restless 1 0 0 1  Easily annoyed or irritable 1 1 1  0  Afraid - awful might happen 2 1 1 1   Total GAD 7 Score 10 6 8 6   Anxiety Difficulty Somewhat difficult Not difficult at all Not difficult at all Not difficult at all   Assessment/ Plan: 63 y.o. female   1. Grief reaction I agree with pursuing counseling.  2. Recurrent major depressive disorder, in partial remission (HCC) Continue current regimen.  May need to consider increasing Wellbutrin if depressive symptoms are ongoing.  The national narcotic database was reviewed and there were no red flags.  Renewal of Klonopin sent - clonazePAM (KLONOPIN) 0.5 MG tablet; TAKE 1 TABLET BY MOUTH TWICE DAILY AS NEEDED FOR ANXIETY (PANIC)  Dispense: 60 tablet; Refill: 3 - escitalopram (LEXAPRO) 20 MG tablet; Take 1 tablet (20 mg total) by mouth daily.  Dispense: 90 tablet; Refill: 1 - buPROPion (WELLBUTRIN XL) 150 MG 24 hr tablet; Take 1 tablet (150 mg total) by mouth  daily.  Dispense: 90 tablet; Refill: 0  3. Panic attack - clonazePAM (KLONOPIN) 0.5 MG tablet; TAKE 1 TABLET BY MOUTH TWICE DAILY AS NEEDED FOR ANXIETY (PANIC)  Dispense: 60 tablet; Refill: 3  4. Acquired hypothyroidism Intermittent heart palpitations.  She is had some weight gain since last visit as well but this may be due to poor dietary choices.  Check thyroid panel - Thyroid Panel With TSH - Basic Metabolic Panel  5. Screening for HIV without presence of risk factors - HIV antibody (with reflex)  6. Generalized abdominal pain Urine without evidence of infection.  I have encouraged her to follow-up with her gastroenterologist I will CC this chart to her.  We discussed red flag signs and symptoms.  She voiced understanding will follow-up as needed - urinalysis- dip and micro   Orders Placed This Encounter  Procedures  . Thyroid Panel With TSH  . Basic Metabolic Panel  . HIV antibody (with reflex)  . urinalysis- dip and micro   Meds ordered this encounter  Medications  . clonazePAM (KLONOPIN) 0.5 MG tablet    Sig: TAKE 1 TABLET BY MOUTH TWICE DAILY AS NEEDED FOR ANXIETY (PANIC)    Dispense:  60 tablet    Refill:  3  . escitalopram (LEXAPRO) 20 MG tablet    Sig: Take 1 tablet (20 mg total) by mouth daily.    Dispense:  90 tablet    Refill:  1    Please consider 90 day supplies to promote better adherence     [name], DO [place] [phone]

## 2019-02-19 ENCOUNTER — Other Ambulatory Visit: Payer: Self-pay | Admitting: Family Medicine

## 2019-02-19 DIAGNOSIS — E039 Hypothyroidism, unspecified: Secondary | ICD-10-CM

## 2019-02-19 LAB — BASIC METABOLIC PANEL
BUN/Creatinine Ratio: 14 (ref 12–28)
BUN: 14 mg/dL (ref 8–27)
CO2: 22 mmol/L (ref 20–29)
Calcium: 9.3 mg/dL (ref 8.7–10.3)
Chloride: 104 mmol/L (ref 96–106)
Creatinine, Ser: 0.98 mg/dL (ref 0.57–1.00)
GFR calc Af Amer: 71 mL/min/{1.73_m2} (ref >59)
GFR calc non Af Amer: 62 mL/min/{1.73_m2} (ref >59)
Glucose: 115 mg/dL — ABNORMAL HIGH (ref 65–99)
Potassium: 4.3 mmol/L (ref 3.5–5.2)
Sodium: 142 mmol/L (ref 134–144)

## 2019-02-19 LAB — THYROID PANEL WITH TSH
Free Thyroxine Index: 3.7 (ref 1.2–4.9)
T3 Uptake Ratio: 33 % (ref 24–39)
T4, Total: 11.1 ug/dL (ref 4.5–12.0)
TSH: 3.49 u[IU]/mL (ref 0.450–4.500)

## 2019-02-19 LAB — HIV ANTIBODY (ROUTINE TESTING W REFLEX): HIV Screen 4th Generation wRfx: NONREACTIVE

## 2019-02-19 MED ORDER — LEVOTHYROXINE SODIUM 88 MCG PO TABS: 88.0000 ug | ORAL_TABLET | Freq: Every day | ORAL | 1 refills | Status: DC

## 2019-02-19 MED ORDER — CEPHALEXIN 500 MG PO CAPS: 500.0000 mg | ORAL_CAPSULE | Freq: Two times a day (BID) | ORAL | 0 refills | Status: AC

## 2019-02-20 ENCOUNTER — Telehealth: Payer: Self-pay | Admitting: Family Medicine

## 2019-02-20 NOTE — Telephone Encounter (Signed)
Pt called and said she received a missed call from nurse regarding her lab results. Wants to be called back.

## 2019-02-20 NOTE — Telephone Encounter (Signed)
Refer to lab   °

## 2019-05-18 ENCOUNTER — Other Ambulatory Visit: Payer: Self-pay | Admitting: Family Medicine

## 2019-05-18 DIAGNOSIS — F3341 Major depressive disorder, recurrent, in partial remission: Secondary | ICD-10-CM

## 2019-05-19 NOTE — Telephone Encounter (Signed)
Ov 06/20/19

## 2019-06-19 ENCOUNTER — Other Ambulatory Visit: Payer: Self-pay | Admitting: Family Medicine

## 2019-06-19 DIAGNOSIS — F3341 Major depressive disorder, recurrent, in partial remission: Secondary | ICD-10-CM

## 2019-06-19 DIAGNOSIS — F410 Panic disorder [episodic paroxysmal anxiety] without agoraphobia: Secondary | ICD-10-CM

## 2019-06-20 ENCOUNTER — Encounter: Payer: Self-pay | Admitting: Family Medicine

## 2019-06-20 ENCOUNTER — Ambulatory Visit (INDEPENDENT_AMBULATORY_CARE_PROVIDER_SITE_OTHER): Payer: Medicaid Other | Admitting: Family Medicine

## 2019-06-20 ENCOUNTER — Other Ambulatory Visit: Payer: Self-pay

## 2019-06-20 VITALS — BP 122/83 | Temp 97.6°F | Ht 64.0 in | Wt 275.4 lb

## 2019-06-20 DIAGNOSIS — E039 Hypothyroidism, unspecified: Secondary | ICD-10-CM | POA: Diagnosis not present

## 2019-06-20 DIAGNOSIS — F4321 Adjustment disorder with depressed mood: Secondary | ICD-10-CM

## 2019-06-20 DIAGNOSIS — F410 Panic disorder [episodic paroxysmal anxiety] without agoraphobia: Secondary | ICD-10-CM

## 2019-06-20 DIAGNOSIS — Z79899 Other long term (current) drug therapy: Secondary | ICD-10-CM | POA: Diagnosis not present

## 2019-06-20 DIAGNOSIS — J301 Allergic rhinitis due to pollen: Secondary | ICD-10-CM | POA: Diagnosis not present

## 2019-06-20 DIAGNOSIS — F3341 Major depressive disorder, recurrent, in partial remission: Secondary | ICD-10-CM | POA: Diagnosis not present

## 2019-06-20 LAB — BAYER DCA HB A1C WAIVED: HB A1C (BAYER DCA - WAIVED): 5.3 % (ref <7.0)

## 2019-06-20 MED ORDER — CLONAZEPAM 0.5 MG PO TABS: ORAL_TABLET | ORAL | 3 refills | Status: DC

## 2019-06-20 MED ORDER — PANTOPRAZOLE SODIUM 40 MG PO TBEC: 40.0000 mg | DELAYED_RELEASE_TABLET | Freq: Every day | ORAL | 3 refills | Status: DC

## 2019-06-20 MED ORDER — BUPROPION HCL ER (XL) 150 MG PO TB24: 150.0000 mg | ORAL_TABLET | Freq: Every day | ORAL | 1 refills | Status: DC

## 2019-06-20 MED ORDER — ESCITALOPRAM OXALATE 20 MG PO TABS: 20.0000 mg | ORAL_TABLET | Freq: Every day | ORAL | 1 refills | Status: DC

## 2019-06-20 MED ORDER — LEVOTHYROXINE SODIUM 88 MCG PO TABS: 88.0000 ug | ORAL_TABLET | Freq: Every day | ORAL | 1 refills | Status: DC

## 2019-06-20 NOTE — Patient Instructions (Signed)

## 2019-06-20 NOTE — Progress Notes (Signed)
Subjective: CC: Follow-up anxiety, depression, hypothyroidism, GERD PCP: [name], DO Erin Hale is a 64 y.o. female presenting to clinic today for:  1.  Anxiety/depression Patient reports fairly good control of anxiety depression.  She does have some intermittent depressive symptoms, citing that her brother passed away from complications of Covid in January.  She continues to take her Lexapro and Klonopin daily as directed.  She has occasional dizzy sensation but she feels that this is related to her sinuses which have not been well controlled with Benadryl.  Denies any falls, nausea, vomiting.  No respiratory depression.  2.  Hypothyroidism Patient reports compliance with Synthroid 88 mcg daily.  No change in voice, difficulty swallowing, change in weight, tremor, heart palpitations  3.  GERD Patient reports good control with Protonix.  She uses this more as a as needed medication in addition to Tums.  Denies any nausea, vomiting, abdominal pain, hematochezia or melena.   ROS: Per HPI  No Known Allergies Past Medical History:  Diagnosis Date  . Bronchial asthma   . Depression   . GERD (gastroesophageal reflux disease)   . Hypothyroidism   . Panic disorder   . Thyroid disease    hypothyroidism    Current Outpatient Medications:  .  acetaminophen (TYLENOL) 650 MG CR tablet, Take 650 mg by mouth every 8 (eight) hours as needed for pain. , Disp: , Rfl:  .  albuterol (VENTOLIN HFA) 108 (90 Base) MCG/ACT inhaler, Inhale 2 puffs into the lungs every 6 (six) hours as needed for wheezing or shortness of breath. (Patient taking differently: Inhale 2 puffs into the lungs every 6 (six) hours as needed for wheezing or shortness of breath. ), Disp: 18 g, Rfl: 0 .  Ascorbic Acid (VITAMIN C) 100 MG tablet, Take 100 mg by mouth daily., Disp: , Rfl:  .  buPROPion (WELLBUTRIN XL) 150 MG 24 hr tablet, Take 1 tablet by mouth once daily, Disp: 90 tablet, Rfl: 0 .  clonazePAM  (KLONOPIN) 0.5 MG tablet, TAKE 1 TABLET BY MOUTH TWICE DAILY AS NEEDED FOR ANXIETY (PANIC), Disp: 60 tablet, Rfl: 3 .  diphenhydrAMINE (BENADRYL) 25 mg capsule, Take 25 mg by mouth every 6 (six) hours as needed., Disp: , Rfl:  .  escitalopram (LEXAPRO) 20 MG tablet, Take 1 tablet (20 mg total) by mouth daily., Disp: 90 tablet, Rfl: 1 .  levothyroxine (SYNTHROID) 88 MCG tablet, Take 1 tablet (88 mcg total) by mouth daily., Disp: 90 tablet, Rfl: 1 .  Melatonin 5 MG CAPS, Take 5 mg by mouth. , Disp: , Rfl:  .  Multiple Vitamins-Minerals (VITAMIN D3 COMPLETE PO), Take by mouth., Disp: , Rfl:  .  pantoprazole (PROTONIX) 40 MG tablet, Take 1 tablet (40 mg total) by mouth daily. Take 30 minutes before breakfast, Disp: 90 tablet, Rfl: 3 .  simethicone (MYLICON) 010 MG chewable tablet, Chew 250 mg by mouth as needed for flatulence. , Disp: , Rfl:  Social History   Socioeconomic History  . Marital status: Divorced    Spouse name: Not on file  . Number of children: Not on file  . Years of education: Not on file  . Highest education level: Not on file  Occupational History  . Not on file  Tobacco Use  . Smoking status: Former Smoker    Types: Cigarettes    Quit date: 03/15/1994    Years since quitting: 25.2  . Smokeless tobacco: Never Used  Substance and Sexual Activity  . Alcohol  use: No  . Drug use: No  . Sexual activity: Not on file  Other Topics Concern  . Not on file  Social History Narrative  . Not on file   Social Determinants of Health   Financial Resource Strain:   . Difficulty of Paying Living Expenses:   Food Insecurity:   . Worried About Charity fundraiser in the Last Year:   . Arboriculturist in the Last Year:   Transportation Needs:   . Film/video editor (Medical):   Marland Kitchen Lack of Transportation (Non-Medical):   Physical Activity:   . Days of Exercise per Week:   . Minutes of Exercise per Session:   Stress:   . Feeling of Stress :   Social Connections:   .  Frequency of Communication with Friends and Family:   . Frequency of Social Gatherings with Friends and Family:   . Attends Religious Services:   . Active Member of Clubs or Organizations:   . Attends Archivist Meetings:   Marland Kitchen Marital Status:   Intimate Partner Violence:   . Fear of Current or Ex-Partner:   . Emotionally Abused:   Marland Kitchen Physically Abused:   . Sexually Abused:    Family History  Problem Relation Age of Onset  . Stroke Mother   . Congestive Heart Failure Mother   . Diabetes Father   . Cancer Father        brain  . Colon cancer Neg Hx   . Colon polyps Neg Hx     Objective: Office vital signs reviewed. BP 122/83   Temp 97.6 F (36.4 C) (Temporal)   Ht '5\' 4"'$  (1.626 m)   Wt 275 lb 6.4 oz (124.9 kg)   LMP  (LMP Unknown)   SpO2 93%   BMI 47.27 kg/m   Physical Examination:  General: Awake, alert, obese, No acute distress HEENT: Normal; no exophthalmos.  No goiter; bilateral nasal turbinates with mild erythema and mild edema.  No purulent nasal discharge.  She has cobblestone appearance of the oropharynx. Cardio: regular rate and rhythm, S1S2 heard, no murmurs appreciated Pulm: clear to auscultation bilaterally, no wheezes, rhonchi or rales; normal work of breathing on room air Extremities: warm, well perfused, No edema, cyanosis or clubbing; +2 pulses bilaterally MSK: slow gait and normal station Skin: dry; intact; no rashes or lesions Neuro: No tremor Psych: Mood stable, speech normal, affect appropriate Depression screen [facility] 2/9 06/20/2019 02/18/2019 08/14/2018  Decreased Interest '2 2 1  '$ Down, Depressed, Hopeless '2 2 1  '$ PHQ - 2 Score '4 4 2  '$ Altered sleeping '2 2 1  '$ Tired, decreased energy 2 2 0  Change in appetite '2 2 2  '$ Feeling bad or failure about yourself  1 2 0  Trouble concentrating 2 2 0  Moving slowly or fidgety/restless 0 1 0  Suicidal thoughts 0 0 0  PHQ-9 Score '13 15 5  '$ Difficult doing work/chores Somewhat difficult Somewhat difficult -    Some recent data might be hidden   GAD 7 : Generalized Anxiety Score 06/20/2019 02/18/2019 08/14/2018 05/13/2018  Nervous, Anxious, on Edge '1 1 1 1  '$ Control/stop worrying '2 2 1 2  '$ Worry too much - different things '2 2 1 2  '$ Trouble relaxing '2 1 1 1  '$ Restless 0 1 0 0  Easily annoyed or irritable '1 1 1 1  '$ Afraid - awful might happen '1 2 1 1  '$ Total GAD 7 Score '9 10 6 8  '$ Anxiety  Difficulty Somewhat difficult Somewhat difficult Not difficult at all Not difficult at all    Assessment/ Plan: 64 y.o. female   1. Panic attack Stable.  National narcotic database was reviewed and there were no red flags.  UDS and CSC updated - ToxASSURE Select 13 (MW), Urine - clonazePAM (KLONOPIN) 0.5 MG tablet; TAKE 1 TABLET BY MOUTH TWICE DAILY AS NEEDED FOR ANXIETY (PANIC)  Dispense: 60 tablet; Refill: 3  2. Grief reaction  3. Recurrent major depressive disorder, in partial remission (HCC) - buPROPion (WELLBUTRIN XL) 150 MG 24 hr tablet; Take 1 tablet (150 mg total) by mouth daily.  Dispense: 90 tablet; Refill: 1 - clonazePAM (KLONOPIN) 0.5 MG tablet; TAKE 1 TABLET BY MOUTH TWICE DAILY AS NEEDED FOR ANXIETY (PANIC)  Dispense: 60 tablet; Refill: 3 - escitalopram (LEXAPRO) 20 MG tablet; Take 1 tablet (20 mg total) by mouth daily.  Dispense: 90 tablet; Refill: 1  4. Acquired hypothyroidism Asymptomatic.  Check labs - Lipid Panel - CMP14+EGFR - Thyroid Panel With TSH - levothyroxine (SYNTHROID) 88 MCG tablet; Take 1 tablet (88 mcg total) by mouth daily.  Dispense: 90 tablet; Refill: 1  5. Controlled substance agreement signed - ToxASSURE Select 13 (MW), Urine  6. Morbid obesity ([place]) - Lipid Panel - CMP14+EGFR - Bayer DCA Hb A1c Waived  7. Seasonal allergic rhinitis due to pollen Recommended use of Claritin and lieu of Benadryl.  Okay to use nasal saline spray and add Nasacort or Flonase if needed.   Orders Placed This Encounter  Procedures  . ToxASSURE Select 13 (MW), Urine  . Lipid Panel  .  CMP14+EGFR  . Thyroid Panel With TSH  . Bayer DCA Hb A1c Waived   No orders of the defined types were placed in this encounter.    [name], DO [place] [phone]

## 2019-06-21 LAB — CMP14+EGFR
ALT: 15 IU/L (ref 0–32)
AST: 23 IU/L (ref 0–40)
Albumin/Globulin Ratio: 1.9 (ref 1.2–2.2)
Albumin: 4.1 g/dL (ref 3.8–4.8)
Alkaline Phosphatase: 122 IU/L — ABNORMAL HIGH (ref 39–117)
BUN/Creatinine Ratio: 12 (ref 12–28)
BUN: 12 mg/dL (ref 8–27)
Bilirubin Total: 0.4 mg/dL (ref 0.0–1.2)
CO2: 24 mmol/L (ref 20–29)
Calcium: 9.1 mg/dL (ref 8.7–10.3)
Chloride: 102 mmol/L (ref 96–106)
Creatinine, Ser: 1 mg/dL (ref 0.57–1.00)
GFR calc Af Amer: 69 mL/min/{1.73_m2} (ref >59)
GFR calc non Af Amer: 60 mL/min/{1.73_m2} (ref >59)
Globulin, Total: 2.2 g/dL (ref 1.5–4.5)
Glucose: 124 mg/dL — ABNORMAL HIGH (ref 65–99)
Potassium: 4.5 mmol/L (ref 3.5–5.2)
Sodium: 140 mmol/L (ref 134–144)
Total Protein: 6.3 g/dL (ref 6.0–8.5)

## 2019-06-21 LAB — LIPID PANEL
Chol/HDL Ratio: 3 ratio (ref 0.0–4.4)
Cholesterol, Total: 187 mg/dL (ref 100–199)
HDL: 63 mg/dL (ref >39)
LDL Chol Calc (NIH): 105 mg/dL — ABNORMAL HIGH (ref 0–99)
Triglycerides: 104 mg/dL (ref 0–149)
VLDL Cholesterol Cal: 19 mg/dL (ref 5–40)

## 2019-06-21 LAB — THYROID PANEL WITH TSH
Free Thyroxine Index: 3.2 (ref 1.2–4.9)
T3 Uptake Ratio: 30 % (ref 24–39)
T4, Total: 10.6 ug/dL (ref 4.5–12.0)
TSH: 2.8 u[IU]/mL (ref 0.450–4.500)

## 2019-06-24 LAB — TOXASSURE SELECT 13 (MW), URINE

## 2019-08-19 ENCOUNTER — Other Ambulatory Visit: Payer: Self-pay | Admitting: Family Medicine

## 2019-08-19 DIAGNOSIS — F3341 Major depressive disorder, recurrent, in partial remission: Secondary | ICD-10-CM

## 2019-09-20 ENCOUNTER — Other Ambulatory Visit: Payer: Self-pay | Admitting: Family Medicine

## 2019-09-20 DIAGNOSIS — E039 Hypothyroidism, unspecified: Secondary | ICD-10-CM

## 2019-10-20 ENCOUNTER — Ambulatory Visit: Payer: Medicaid Other | Admitting: Family Medicine

## 2019-11-03 ENCOUNTER — Ambulatory Visit: Payer: Medicaid Other | Admitting: Family Medicine

## 2019-11-06 ENCOUNTER — Other Ambulatory Visit: Payer: Self-pay | Admitting: Family Medicine

## 2019-11-06 DIAGNOSIS — F3341 Major depressive disorder, recurrent, in partial remission: Secondary | ICD-10-CM

## 2019-11-06 DIAGNOSIS — F410 Panic disorder [episodic paroxysmal anxiety] without agoraphobia: Secondary | ICD-10-CM

## 2019-11-06 NOTE — Telephone Encounter (Signed)
Patient needs to be seen for refill on clonazepam.

## 2019-11-07 ENCOUNTER — Other Ambulatory Visit: Payer: Self-pay | Admitting: Family Medicine

## 2019-11-07 DIAGNOSIS — F3341 Major depressive disorder, recurrent, in partial remission: Secondary | ICD-10-CM

## 2019-11-07 DIAGNOSIS — F410 Panic disorder [episodic paroxysmal anxiety] without agoraphobia: Secondary | ICD-10-CM

## 2019-11-07 NOTE — Telephone Encounter (Signed)
Upcoming appointment 12/22/2019 Last office visit 06/20/2019 with instruction to follow up in 4 months Last refill 06/20/2019, #60, 3 refills

## 2019-11-10 ENCOUNTER — Other Ambulatory Visit: Payer: Self-pay | Admitting: Family Medicine

## 2019-11-10 DIAGNOSIS — F410 Panic disorder [episodic paroxysmal anxiety] without agoraphobia: Secondary | ICD-10-CM

## 2019-11-10 DIAGNOSIS — F3341 Major depressive disorder, recurrent, in partial remission: Secondary | ICD-10-CM

## 2019-11-11 ENCOUNTER — Telehealth: Payer: Self-pay | Admitting: Family Medicine

## 2019-11-11 ENCOUNTER — Other Ambulatory Visit: Payer: Self-pay | Admitting: Family Medicine

## 2019-11-11 DIAGNOSIS — F3341 Major depressive disorder, recurrent, in partial remission: Secondary | ICD-10-CM

## 2019-11-11 DIAGNOSIS — F410 Panic disorder [episodic paroxysmal anxiety] without agoraphobia: Secondary | ICD-10-CM

## 2019-11-12 ENCOUNTER — Telehealth (INDEPENDENT_AMBULATORY_CARE_PROVIDER_SITE_OTHER): Payer: Medicaid Other | Admitting: Family Medicine

## 2019-11-12 DIAGNOSIS — F3341 Major depressive disorder, recurrent, in partial remission: Secondary | ICD-10-CM | POA: Diagnosis not present

## 2019-11-12 DIAGNOSIS — F410 Panic disorder [episodic paroxysmal anxiety] without agoraphobia: Secondary | ICD-10-CM

## 2019-11-12 MED ORDER — CLONAZEPAM 0.5 MG PO TABS: ORAL_TABLET | ORAL | 1 refills | Status: DC

## 2019-11-12 NOTE — Telephone Encounter (Signed)
FYI

## 2019-11-12 NOTE — Telephone Encounter (Signed)
Put her in the evening clinic.  I can do a VIDEO visit.  Can NOT refill outside a visit due to controlled nature.

## 2019-11-12 NOTE — Telephone Encounter (Signed)
appt made pt aware

## 2019-11-12 NOTE — Progress Notes (Signed)
Video visit not successful, switched to phone visit  Subjective: CC: GAD w/ panic PCP: [name], DO JAS:Erin Hale is a 64 y.o. female. Patient provides verbal consent for consult held via video.  Due to COVID-19 pandemic this visit was conducted virtually. This visit type was conducted due to national recommendations for restrictions regarding the COVID-19 Pandemic (e.g. social distancing, sheltering in place) in an effort to limit this patient's exposure and mitigate transmission in our community. All issues noted in this document were discussed and addressed.  A physical exam was not performed with this format.   Location of patient: home Location of provider: [facility] Others present for call: none  1. GAD w/ panic Patient's car has a flat tire so she couldn't get here in August.  She has been trying to cut back on her Klonopin.  She was out of Lexapro for 2 weeks because she couldn't get down there to pick up her prescription.    She does report some sensation of lightheadedness and she thinks this may be related to having been without her Lexapro from last couple weeks.  Overall anxiety is stable.  Does not report any exacerbation of depressive symptoms.  Her daughter will be going to pick up her medications today and is trying to run errands that she has not been able to do due to difficulty with transportation.   ROS: Per HPI  No Known Allergies Past Medical History:  Diagnosis Date  . Bronchial asthma   . Depression   . GERD (gastroesophageal reflux disease)   . Hypothyroidism   . Panic disorder   . Thyroid disease    hypothyroidism    Current Outpatient Medications:  .  acetaminophen (TYLENOL) 650 MG CR tablet, Take 650 mg by mouth every 8 (eight) hours as needed for pain. , Disp: , Rfl:  .  albuterol (VENTOLIN HFA) 108 (90 Base) MCG/ACT inhaler, Inhale 2 puffs into the lungs every 6 (six) hours as needed for wheezing or shortness of breath. (Patient taking  differently: Inhale 2 puffs into the lungs every 6 (six) hours as needed for wheezing or shortness of breath. ), Disp: 18 g, Rfl: 0 .  Ascorbic Acid (VITAMIN C) 100 MG tablet, Take 100 mg by mouth daily., Disp: , Rfl:  .  buPROPion (WELLBUTRIN XL) 150 MG 24 hr tablet, Take 1 tablet (150 mg total) by mouth daily., Disp: 90 tablet, Rfl: 1 .  clonazePAM (KLONOPIN) 0.5 MG tablet, TAKE 1 TABLET BY MOUTH TWICE DAILY AS NEEDED FOR ANXIETY (PANIC), Disp: 60 tablet, Rfl: 3 .  diphenhydrAMINE (BENADRYL) 25 mg capsule, Take 25 mg by mouth every 6 (six) hours as needed., Disp: , Rfl:  .  escitalopram (LEXAPRO) 20 MG tablet, Take 1 tablet by mouth once daily, Disp: 90 tablet, Rfl: 0 .  levothyroxine (SYNTHROID) 88 MCG tablet, Take 1 tablet by mouth once daily, Disp: 90 tablet, Rfl: 2 .  Melatonin 5 MG CAPS, Take 5 mg by mouth. , Disp: , Rfl:  .  Multiple Vitamins-Minerals (VITAMIN D3 COMPLETE PO), Take by mouth., Disp: , Rfl:  .  pantoprazole (PROTONIX) 40 MG tablet, Take 1 tablet (40 mg total) by mouth daily. Take 30 minutes before breakfast, Disp: 90 tablet, Rfl: 3 .  simethicone (MYLICON) 767 MG chewable tablet, Chew 250 mg by mouth as needed for flatulence. , Disp: , Rfl:   Assessment/ Plan: 64 y.o. female   1. Panic attack Stable. She has appt in Oct. Stressed importance of  keeping appt.  She is aware of date/ time.  Resume Lexapro.  Suspect withdrawal. - clonazePAM (KLONOPIN) 0.5 MG tablet; TAKE 1 TABLET BY MOUTH TWICE DAILY AS NEEDED FOR ANXIETY (PANIC)  Dispense: 60 tablet; Refill: 1  2. Recurrent major depressive disorder, in partial remission (HCC) stable - clonazePAM (KLONOPIN) 0.5 MG tablet; TAKE 1 TABLET BY MOUTH TWICE DAILY AS NEEDED FOR ANXIETY (PANIC)  Dispense: 60 tablet; Refill: 1  The Narcotic Database has been reviewed.  There were no red flags.    Start time: 1:23pm (video unsuccessful); 1:29pm (attempted to reach by phone) End time: 1:34pm  Total time spent on patient care  (including video visit/ documentation): 10 minutes  [place], [place] [phone]

## 2019-12-22 ENCOUNTER — Ambulatory Visit: Payer: Medicaid Other | Admitting: Family Medicine

## 2020-01-13 ENCOUNTER — Other Ambulatory Visit: Payer: Self-pay | Admitting: Family Medicine

## 2020-01-13 DIAGNOSIS — F410 Panic disorder [episodic paroxysmal anxiety] without agoraphobia: Secondary | ICD-10-CM

## 2020-01-13 DIAGNOSIS — F3341 Major depressive disorder, recurrent, in partial remission: Secondary | ICD-10-CM

## 2020-01-17 ENCOUNTER — Other Ambulatory Visit: Payer: Self-pay | Admitting: Family Medicine

## 2020-01-17 DIAGNOSIS — F3341 Major depressive disorder, recurrent, in partial remission: Secondary | ICD-10-CM

## 2020-01-17 DIAGNOSIS — F410 Panic disorder [episodic paroxysmal anxiety] without agoraphobia: Secondary | ICD-10-CM

## 2020-01-19 ENCOUNTER — Telehealth: Payer: Self-pay | Admitting: Family Medicine

## 2020-01-19 ENCOUNTER — Other Ambulatory Visit: Payer: Self-pay | Admitting: Family Medicine

## 2020-01-19 DIAGNOSIS — F410 Panic disorder [episodic paroxysmal anxiety] without agoraphobia: Secondary | ICD-10-CM

## 2020-01-19 DIAGNOSIS — F3341 Major depressive disorder, recurrent, in partial remission: Secondary | ICD-10-CM

## 2020-01-19 NOTE — Telephone Encounter (Signed)
Unable to reach, this is a controlled substance and will have to be filled at 01/27/20 visit

## 2020-01-19 NOTE — Telephone Encounter (Signed)
°  Prescription Request  01/19/2020  What is the name of the medication or equipment? Klonopin  Have you contacted your pharmacy to request a refill? (if applicable) yes   Which pharmacy would you like this sent to? [facility] in [place]  Has appt on 11/23   Patient notified that their request is being sent to the clinical staff for review and that they should receive a response within 2 business days.

## 2020-01-27 ENCOUNTER — Other Ambulatory Visit: Payer: Self-pay

## 2020-01-27 ENCOUNTER — Encounter: Payer: Self-pay | Admitting: Family Medicine

## 2020-01-27 ENCOUNTER — Ambulatory Visit: Payer: Medicaid Other | Admitting: Family Medicine

## 2020-01-27 VITALS — BP 124/83 | HR 99 | Temp 97.9°F | Ht 64.0 in | Wt 272.8 lb

## 2020-01-27 DIAGNOSIS — R109 Unspecified abdominal pain: Secondary | ICD-10-CM

## 2020-01-27 DIAGNOSIS — G8929 Other chronic pain: Secondary | ICD-10-CM | POA: Diagnosis not present

## 2020-01-27 DIAGNOSIS — E039 Hypothyroidism, unspecified: Secondary | ICD-10-CM

## 2020-01-27 DIAGNOSIS — F410 Panic disorder [episodic paroxysmal anxiety] without agoraphobia: Secondary | ICD-10-CM

## 2020-01-27 DIAGNOSIS — S39012A Strain of muscle, fascia and tendon of lower back, initial encounter: Secondary | ICD-10-CM

## 2020-01-27 DIAGNOSIS — K449 Diaphragmatic hernia without obstruction or gangrene: Secondary | ICD-10-CM | POA: Diagnosis not present

## 2020-01-27 DIAGNOSIS — R1012 Left upper quadrant pain: Secondary | ICD-10-CM | POA: Diagnosis not present

## 2020-01-27 DIAGNOSIS — R5382 Chronic fatigue, unspecified: Secondary | ICD-10-CM

## 2020-01-27 DIAGNOSIS — F3341 Major depressive disorder, recurrent, in partial remission: Secondary | ICD-10-CM | POA: Diagnosis not present

## 2020-01-27 MED ORDER — CLONAZEPAM 0.5 MG PO TABS: ORAL_TABLET | ORAL | 5 refills | Status: DC

## 2020-01-27 MED ORDER — ESCITALOPRAM OXALATE 20 MG PO TABS: 20.0000 mg | ORAL_TABLET | Freq: Every day | ORAL | 3 refills | Status: DC

## 2020-01-27 MED ORDER — BUPROPION HCL ER (XL) 300 MG PO TB24: 300.0000 mg | ORAL_TABLET | Freq: Every day | ORAL | 0 refills | Status: DC

## 2020-01-27 NOTE — Patient Instructions (Addendum)
You will be called by a counselor I have increased your Bupropion for the depression. Schedule a visit with Dr Marin Comment at [facility] for your vision I put a new request in for the gastric doctor to see you.  Next visit should be full physical exam with fasting labs.  Try and schedule for an early morning so you don't have to fast all day.  You had labs performed today.  You will be contacted with the results of the labs once they are available, usually in the next 3 business days for routine lab work.  If you have an active my chart account, they will be released to your MyChart.  If you prefer to have these labs released to you via telephone, please let us know.

## 2020-01-27 NOTE — Progress Notes (Signed)
Subjective: CC: Follow-up anxiety, panic attacks, depression, hypothyroidism PCP: [name], DO FEO:FHQRF E Passon is a 64 y.o. female presenting to clinic today for:  1.  Anxiety w/ Panic attacks/depression Anxiety and depression have not been well controlled lately.  She actually ran out of her Klonopin about 10 days ago and experienced quite a bit of jitteriness, heart palpitations and increased anxiety.  She unfortunately just lost track of time due to multiple stressors and failed to make an appointment for refills.  She has been compliant with her Lexapro and Wellbutrin.  She feels abandoned by her family and like they do not attempt to reach out to her often enough on the telephone.  She actually brings me a very long letter today with all of her concerns.  She reports low motivation, decreased energy.  2.  Hypothyroidism Patient faithfully takes her Synthroid 88 mcg daily.  She reports fatigue, heart palpitations as above.  She has been having some abdominal discomfort.  She points to a lesion on her left upper abdomen.  Its been hard and seems to wax and wane in hardness depending on whether or not she has eaten.  She has a known hiatal hernia.  She knows that she supposed to eat small meals but often does not.  She has not lost any weight despite decreased eating in general.  With regards to fatigue, she reports easy fatigability despite only doing small things around the house.  She feels weak and like she cannot do anything.  She reports emotional lability as above  She has been having some difficulty with her eyes but has not yet scheduled a follow-up visit with her eye doctor.  She has not seen her in over 4 years now.  She has known history of torn retinas with associated floaters bilaterally.  ROS: Per HPI  No Known Allergies Past Medical History:  Diagnosis Date  . Bronchial asthma   . Depression   . GERD (gastroesophageal reflux disease)   . Hypothyroidism   .  Panic disorder   . Thyroid disease    hypothyroidism    Current Outpatient Medications:  .  acetaminophen (TYLENOL) 650 MG CR tablet, Take 650 mg by mouth every 8 (eight) hours as needed for pain. , Disp: , Rfl:  .  albuterol (VENTOLIN HFA) 108 (90 Base) MCG/ACT inhaler, Inhale 2 puffs into the lungs every 6 (six) hours as needed for wheezing or shortness of breath. (Patient taking differently: Inhale 2 puffs into the lungs every 6 (six) hours as needed for wheezing or shortness of breath. ), Disp: 18 g, Rfl: 0 .  Ascorbic Acid (VITAMIN C) 100 MG tablet, Take 100 mg by mouth daily., Disp: , Rfl:  .  buPROPion (WELLBUTRIN XL) 150 MG 24 hr tablet, Take 1 tablet (150 mg total) by mouth daily., Disp: 90 tablet, Rfl: 1 .  clonazePAM (KLONOPIN) 0.5 MG tablet, TAKE 1 TABLET BY MOUTH TWICE DAILY AS NEEDED FOR ANXIETY (PANIC), Disp: 60 tablet, Rfl: 1 .  diphenhydrAMINE (BENADRYL) 25 mg capsule, Take 25 mg by mouth every 6 (six) hours as needed., Disp: , Rfl:  .  escitalopram (LEXAPRO) 20 MG tablet, Take 1 tablet by mouth once daily, Disp: 90 tablet, Rfl: 0 .  levothyroxine (SYNTHROID) 88 MCG tablet, Take 1 tablet by mouth once daily, Disp: 90 tablet, Rfl: 2 .  Melatonin 5 MG CAPS, Take 5 mg by mouth. , Disp: , Rfl:  .  Multiple Vitamins-Minerals (VITAMIN D3 COMPLETE PO),  Take by mouth., Disp: , Rfl:  .  pantoprazole (PROTONIX) 40 MG tablet, Take 1 tablet (40 mg total) by mouth daily. Take 30 minutes before breakfast, Disp: 90 tablet, Rfl: 3 .  simethicone (MYLICON) 751 MG chewable tablet, Chew 250 mg by mouth as needed for flatulence. , Disp: , Rfl:  Social History   Socioeconomic History  . Marital status: Divorced    Spouse name: Not on file  . Number of children: Not on file  . Years of education: Not on file  . Highest education level: Not on file  Occupational History  . Not on file  Tobacco Use  . Smoking status: Former Smoker    Types: Cigarettes    Quit date: 03/15/1994    Years since  quitting: 25.8  . Smokeless tobacco: Never Used  Vaping Use  . Vaping Use: Never used  Substance and Sexual Activity  . Alcohol use: No  . Drug use: No  . Sexual activity: Not on file  Other Topics Concern  . Not on file  Social History Narrative  . Not on file   Social Determinants of Health   Financial Resource Strain:   . Difficulty of Paying Living Expenses: Not on file  Food Insecurity:   . Worried About Charity fundraiser in the Last Year: Not on file  . Ran Out of Food in the Last Year: Not on file  Transportation Needs:   . Lack of Transportation (Medical): Not on file  . Lack of Transportation (Non-Medical): Not on file  Physical Activity:   . Days of Exercise per Week: Not on file  . Minutes of Exercise per Session: Not on file  Stress:   . Feeling of Stress : Not on file  Social Connections:   . Frequency of Communication with Friends and Family: Not on file  . Frequency of Social Gatherings with Friends and Family: Not on file  . Attends Religious Services: Not on file  . Active Member of Clubs or Organizations: Not on file  . Attends Archivist Meetings: Not on file  . Marital Status: Not on file  Intimate Partner Violence:   . Fear of Current or Ex-Partner: Not on file  . Emotionally Abused: Not on file  . Physically Abused: Not on file  . Sexually Abused: Not on file   Family History  Problem Relation Age of Onset  . Stroke Mother   . Congestive Heart Failure Mother   . Diabetes Father   . Cancer Father        brain  . Colon cancer Neg Hx   . Colon polyps Neg Hx     Objective: Office vital signs reviewed. BP 124/83   Pulse 99   Temp 97.9 F (36.6 C)   Ht 5\' 4"  (1.626 m)   Wt 272 lb 12.8 oz (123.7 kg)   LMP  (LMP Unknown)   SpO2 94%   BMI 46.83 kg/m   Physical Examination:  General: Awake, alert, obese, No acute distress HEENT: Normal; no goiter.  Neck subthalamus.  No palpable thyroid masses.  TMs intact bilaterally with  slightly dulled reflexes. Cardio: regular rate and rhythm, S1S2 heard, no murmurs appreciated Pulm: clear to auscultation bilaterally, no wheezes, rhonchi or rales; normal work of breathing on room air GI: Obese, left upper quadrant with palpable tennis ball sized mass that is hard in nature.  No guarding or rebound. Extremities: warm, well perfused, No edema, cyanosis or clubbing; +2 pulses bilaterally  MSK: Slow, stiff gait.  No appreciable joint swelling or redness Psych: Mood is depressed.  Eye contact is fair.  Thought process linear.  Depression screen [facility] 2/9 01/27/2020 06/20/2019 02/18/2019  Decreased Interest 2 2 2   Down, Depressed, Hopeless 2 2 2   PHQ - 2 Score 4 4 4   Altered sleeping 2 2 2   Tired, decreased energy 3 2 2   Change in appetite 3 2 2   Feeling bad or failure about yourself  2 1 2   Trouble concentrating 2 2 2   Moving slowly or fidgety/restless 1 0 1  Suicidal thoughts 0 0 0  PHQ-9 Score 17 13 15   Difficult doing work/chores Very difficult Somewhat difficult Somewhat difficult  Some recent data might be hidden   GAD 7 : Generalized Anxiety Score 01/27/2020 06/20/2019 02/18/2019 08/14/2018  Nervous, Anxious, on Edge 2 1 1 1   Control/stop worrying 3 2 2 1   Worry too much - different things 3 2 2 1   Trouble relaxing 2 2 1 1   Restless 1 0 1 0  Easily annoyed or irritable 1 1 1 1   Afraid - awful might happen 3 1 2 1   Total GAD 7 Score 15 9 10 6   Anxiety Difficulty Very difficult Somewhat difficult Somewhat difficult Not difficult at all    Assessment/ Plan: 64 y.o. female   Acquired hypothyroidism - Plan: Thyroid Panel With TSH, Basic Metabolic Panel  Panic attack - Plan: Basic Metabolic Panel, clonazePAM (KLONOPIN) 0.5 MG tablet  Recurrent major depressive disorder, in partial remission ([place]) - Plan: Basic Metabolic Panel, buPROPion (WELLBUTRIN XL) 300 MG 24 hr tablet, escitalopram (LEXAPRO) 20 MG tablet, clonazePAM (KLONOPIN) 0.5 MG tablet  Chronic fatigue -  Plan: CBC, Vitamin B12  Chronic abdominal pain - Plan: Ambulatory referral to Gastroenterology, CT Abdomen Pelvis Wo Contrast  Strain of lumbar region, initial encounter  Left upper quadrant abdominal pain - Plan: CT Abdomen Pelvis Wo Contrast  Hiatal hernia - Plan: Ambulatory referral to Gastroenterology, CT Abdomen Pelvis Wo Contrast  Check thyroid panel.  May be the etiology of some of the depression, weakness and fatigue that she is experiencing.  I certainly think that her increased anxiety is related to the withdrawal from clonazepam.  This has been renewed.  National narcotic database was reviewed and there were no red flags  I am increasing her Wellbutrin to 300 mg daily.  She can continue her Lexapro 20 mg daily.  I have also reached out to virtual behavioral health and asked that they give her a call for counseling services  With regards to her chronic abdominal pain, she has a known hiatal hernia.  She does have a palpable hard lesion noted in the left upper quadrant.  Uncertain if this is her hiatal hernia or something else.  Going to see if I can coordinate a CT abdomen pelvis for her to further evaluate.  I have also referred her back to her gastroenterologist.  With regards to her lumbar pain this is likely spasm in the setting of morbid obesity and physical deconditioning.  I have given her home physical therapy exercises to perform.  My plan is to follow-up with her in 4 weeks either in person or virtually.  No orders of the defined types were placed in this encounter.  No orders of the defined types were placed in this encounter.    [name], DO [place] [phone]

## 2020-01-28 ENCOUNTER — Telehealth: Payer: Self-pay

## 2020-01-28 LAB — THYROID PANEL WITH TSH
Free Thyroxine Index: 3.3 (ref 1.2–4.9)
T3 Uptake Ratio: 32 % (ref 24–39)
T4, Total: 10.4 ug/dL (ref 4.5–12.0)
TSH: 4.28 u[IU]/mL (ref 0.450–4.500)

## 2020-01-28 LAB — BASIC METABOLIC PANEL
BUN/Creatinine Ratio: 11 — ABNORMAL LOW (ref 12–28)
BUN: 12 mg/dL (ref 8–27)
CO2: 22 mmol/L (ref 20–29)
Calcium: 9.2 mg/dL (ref 8.7–10.3)
Chloride: 103 mmol/L (ref 96–106)
Creatinine, Ser: 1.13 mg/dL — ABNORMAL HIGH (ref 0.57–1.00)
GFR calc Af Amer: 60 mL/min/{1.73_m2} (ref >59)
GFR calc non Af Amer: 52 mL/min/{1.73_m2} — ABNORMAL LOW (ref >59)
Glucose: 106 mg/dL — ABNORMAL HIGH (ref 65–99)
Potassium: 4.2 mmol/L (ref 3.5–5.2)
Sodium: 141 mmol/L (ref 134–144)

## 2020-01-28 LAB — CBC
Hematocrit: 46.9 % — ABNORMAL HIGH (ref 34.0–46.6)
Hemoglobin: 15.5 g/dL (ref 11.1–15.9)
MCH: 28.4 pg (ref 26.6–33.0)
MCHC: 33 g/dL (ref 31.5–35.7)
MCV: 86 fL (ref 79–97)
Platelets: 315 10*3/uL (ref 150–450)
RBC: 5.46 x10E6/uL — ABNORMAL HIGH (ref 3.77–5.28)
RDW: 13.6 % (ref 11.7–15.4)
WBC: 10.1 10*3/uL (ref 3.4–10.8)

## 2020-01-28 LAB — VITAMIN B12: Vitamin B-12: 427 pg/mL (ref 232–1245)

## 2020-01-28 NOTE — Telephone Encounter (Signed)
In result notes.  

## 2020-01-30 ENCOUNTER — Telehealth: Payer: Self-pay | Admitting: Licensed Clinical Social Worker

## 2020-01-30 NOTE — Telephone Encounter (Signed)
Unsuccessful attempt to make contact with Patient for initial [facility] session.  Writer will call again.

## 2020-02-02 ENCOUNTER — Telehealth: Payer: Self-pay | Admitting: Licensed Clinical Social Worker

## 2020-02-02 ENCOUNTER — Telehealth: Payer: Self-pay

## 2020-02-02 NOTE — Telephone Encounter (Signed)
Left message encouraging call back °

## 2020-02-02 NOTE — Telephone Encounter (Signed)
Left message to call back  

## 2020-02-04 ENCOUNTER — Encounter: Payer: Self-pay | Admitting: Internal Medicine

## 2020-02-04 ENCOUNTER — Telehealth: Payer: Self-pay | Admitting: Family Medicine

## 2020-02-04 NOTE — Telephone Encounter (Signed)
Patient aware of results on 02/02/2020

## 2020-02-06 ENCOUNTER — Telehealth: Payer: Self-pay | Admitting: Licensed Clinical Social Worker

## 2020-02-06 NOTE — Telephone Encounter (Signed)
Unsuccesssful attempt to make contact. Called twice as requested. Will try again

## 2020-02-09 ENCOUNTER — Telehealth: Payer: Self-pay | Admitting: Licensed Clinical Social Worker

## 2020-02-09 NOTE — Telephone Encounter (Signed)
Left message encouraging contact 

## 2020-02-18 ENCOUNTER — Telehealth: Payer: Self-pay | Admitting: Family Medicine

## 2020-02-24 ENCOUNTER — Ambulatory Visit (INDEPENDENT_AMBULATORY_CARE_PROVIDER_SITE_OTHER): Payer: Medicaid Other | Admitting: Family Medicine

## 2020-02-24 DIAGNOSIS — J329 Chronic sinusitis, unspecified: Secondary | ICD-10-CM

## 2020-02-24 DIAGNOSIS — J310 Chronic rhinitis: Secondary | ICD-10-CM

## 2020-02-24 DIAGNOSIS — F410 Panic disorder [episodic paroxysmal anxiety] without agoraphobia: Secondary | ICD-10-CM | POA: Diagnosis not present

## 2020-02-24 DIAGNOSIS — F3341 Major depressive disorder, recurrent, in partial remission: Secondary | ICD-10-CM

## 2020-02-24 MED ORDER — AZELASTINE HCL 0.1 % NA SOLN: 1.0000 | Freq: Two times a day (BID) | NASAL | 12 refills | Status: DC

## 2020-02-24 NOTE — Progress Notes (Signed)
Telephone visit  Subjective: CC: GAD/ Depression  PCP: [name], DO RVU:YEBXI E Mertens is a 64 y.o. female calls for telephone consult today. Patient provides verbal consent for consult held via phone.  Due to COVID-19 pandemic this visit was conducted virtually. This visit type was conducted due to national recommendations for restrictions regarding the COVID-19 Pandemic (e.g. social distancing, sheltering in place) in an effort to limit this patient's exposure and mitigate transmission in our community. All issues noted in this document were discussed and addressed.  A physical exam was not performed with this format.   Location of patient: home Location of provider: [facility] Others present for call: none  1. GAD/ Depression Patient reports that she is feeling pretty good and doing "ok I guess".  She has been using the 300mg  of Wellbutrin (the week after the 23rd).  She's noticed some improvement but still has "her times".    2. Sinus drainage She reports thick mucus.  She is hydrating well and using Claritin.  No fevers and no color to drainage.  ROS: Per HPI  No Known Allergies Past Medical History:  Diagnosis Date  . Bronchial asthma   . Depression   . GERD (gastroesophageal reflux disease)   . Hypothyroidism   . Panic disorder   . Thyroid disease    hypothyroidism    Current Outpatient Medications:  .  acetaminophen (TYLENOL) 650 MG CR tablet, Take 650 mg by mouth every 8 (eight) hours as needed for pain. , Disp: , Rfl:  .  albuterol (VENTOLIN HFA) 108 (90 Base) MCG/ACT inhaler, Inhale 2 puffs into the lungs every 6 (six) hours as needed for wheezing or shortness of breath. (Patient taking differently: Inhale 2 puffs into the lungs every 6 (six) hours as needed for wheezing or shortness of breath. ), Disp: 18 g, Rfl: 0 .  Ascorbic Acid (VITAMIN C) 100 MG tablet, Take 100 mg by mouth daily., Disp: , Rfl:  .  buPROPion (WELLBUTRIN XL) 300 MG 24 hr tablet, Take 1  tablet (300 mg total) by mouth daily., Disp: 90 tablet, Rfl: 0 .  clonazePAM (KLONOPIN) 0.5 MG tablet, TAKE 1 TABLET BY MOUTH TWICE DAILY AS NEEDED FOR ANXIETY (PANIC), Disp: 60 tablet, Rfl: 5 .  diphenhydrAMINE (BENADRYL) 25 mg capsule, Take 25 mg by mouth every 6 (six) hours as needed., Disp: , Rfl:  .  escitalopram (LEXAPRO) 20 MG tablet, Take 1 tablet (20 mg total) by mouth daily., Disp: 90 tablet, Rfl: 3 .  levothyroxine (SYNTHROID) 88 MCG tablet, Take 1 tablet by mouth once daily, Disp: 90 tablet, Rfl: 2 .  Melatonin 5 MG CAPS, Take 5 mg by mouth. , Disp: , Rfl:  .  Multiple Vitamins-Minerals (VITAMIN D3 COMPLETE PO), Take by mouth., Disp: , Rfl:  .  pantoprazole (PROTONIX) 40 MG tablet, Take 1 tablet (40 mg total) by mouth daily. Take 30 minutes before breakfast, Disp: 90 tablet, Rfl: 3 .  simethicone (MYLICON) 356 MG chewable tablet, Chew 250 mg by mouth as needed for flatulence. , Disp: , Rfl:   Depression screen [facility] 2/9 02/24/2020 01/27/2020 06/20/2019  Decreased Interest 1 2 2   Down, Depressed, Hopeless 1 2 2   PHQ - 2 Score 2 4 4   Altered sleeping 1 2 2   Tired, decreased energy 1 3 2   Change in appetite 1 3 2   Feeling bad or failure about yourself  0 2 1  Trouble concentrating 0 2 2  Moving slowly or fidgety/restless 0 1 0  Suicidal thoughts 0 0 0  PHQ-9 Score 5 17 13   Difficult doing work/chores Somewhat difficult Very difficult Somewhat difficult  Some recent data might be hidden    Assessment/ Plan: 64 y.o. female   Rhinosinusitis - Plan: azelastine (ASTELIN) 0.1 % nasal spray  Recurrent major depressive disorder, in partial remission ([place])  Panic attack  I have prescribed Astelin.  Okay to use nasal saline, humidification and oral antihistamine as prescribed  Her depressive disorder seems to be slightly better with the Wellbutrin dose increase.  We will continue her other medications at current dosing.  She will follow-up with me after the first the new year, sooner  if needed  Start time: 10:38am End time: 10:44am  Total time spent on patient care (including telephone call/ virtual visit): 6 minutes  [place], [place] [phone]

## 2020-02-25 ENCOUNTER — Telehealth: Payer: Self-pay | Admitting: Family Medicine

## 2020-03-01 ENCOUNTER — Ambulatory Visit (HOSPITAL_COMMUNITY): Payer: Medicaid Other

## 2020-03-01 ENCOUNTER — Ambulatory Visit: Payer: Medicaid Other | Admitting: Family Medicine

## 2020-03-04 ENCOUNTER — Telehealth: Payer: Self-pay

## 2020-03-04 NOTE — Telephone Encounter (Signed)
Pt has an appt on 03/20/19 at [facility] for CT scan and was told she had to come there to pick up her prep the day before.  Pt does not want to have to drive to [facility] 2 days in a row. Wants to know if Dr Nadine Counts and just send Rx to her pharmacy to pick up for the prep she has to take.

## 2020-03-04 NOTE — Telephone Encounter (Signed)
Do we have prep her she can drink?

## 2020-03-08 NOTE — Telephone Encounter (Signed)
No answer, no voicemail.

## 2020-03-08 NOTE — Telephone Encounter (Signed)
I'm not sure what prep you are talking about?  Patient is scheduled for CT without contrast.  Are you talking about a bowel cleanout?

## 2020-03-08 NOTE — Telephone Encounter (Signed)
Pt would like to know if a solution can be called in for her CT scan so that she does not have to go to the hospital to pick it up

## 2020-03-08 NOTE — Telephone Encounter (Signed)
Pt returned missed call regarding issue on if she needs prep solution for her upcoming CT scan.. CT scan is scheduled WITHOUT contrast but pt says she was told by Georganna Skeans that she would have to pick up prep solution at [facility] 24 hrs before her CT scan.. can someone call Jeani Hawking to clarify if pt needs prep solution or not and if so, what, so Dr Nadine Counts knows what to order for pt to pick up at local pharmacy?

## 2020-03-08 NOTE — Telephone Encounter (Signed)
Can you clarify?  I've not had to do this for other CT scans

## 2020-03-08 NOTE — Telephone Encounter (Signed)
Pt returning call

## 2020-03-08 NOTE — Telephone Encounter (Signed)
Yes we no longer have th bowel prep.  Please advise

## 2020-03-08 NOTE — Telephone Encounter (Signed)
Please call pt to clarify what her needs actually are.  She is scheduled for a NONcontrast CT.  Not sure if there is a miscommunication but whatever she is needing, she likely needs to get from [facility].

## 2020-03-08 NOTE — Telephone Encounter (Signed)
Attempted to contact patient - NA  NON CONTRAST- does anything need to be ordered ?

## 2020-03-08 NOTE — Telephone Encounter (Signed)
Please send me what exactly needs to be ordered for CT and I will gladly place the order. I've never had to order any type of prep before a noncontract CT before.

## 2020-03-09 ENCOUNTER — Telehealth: Payer: Self-pay | Admitting: Family Medicine

## 2020-03-09 NOTE — Telephone Encounter (Signed)
Pt aware of provider feedback and voiced understanding. 

## 2020-03-09 NOTE — Telephone Encounter (Signed)
No please pick up prep

## 2020-03-09 NOTE — Telephone Encounter (Signed)
Please see other telephone encounter-will close encounter.

## 2020-03-19 ENCOUNTER — Ambulatory Visit (HOSPITAL_COMMUNITY): Payer: Medicaid Other

## 2020-03-25 ENCOUNTER — Ambulatory Visit: Payer: Self-pay | Admitting: Gastroenterology

## 2020-04-02 ENCOUNTER — Ambulatory Visit (HOSPITAL_COMMUNITY): Payer: Medicaid Other

## 2020-04-07 ENCOUNTER — Telehealth: Payer: Self-pay | Admitting: Licensed Clinical Social Worker

## 2020-04-07 NOTE — Telephone Encounter (Signed)
Per request, called twice, no response, left generic message encouraging call back

## 2020-04-15 ENCOUNTER — Ambulatory Visit: Payer: Self-pay | Admitting: Gastroenterology

## 2020-04-28 ENCOUNTER — Other Ambulatory Visit: Payer: Self-pay | Admitting: Family Medicine

## 2020-04-28 DIAGNOSIS — F3341 Major depressive disorder, recurrent, in partial remission: Secondary | ICD-10-CM

## 2020-05-12 ENCOUNTER — Ambulatory Visit (HOSPITAL_COMMUNITY): Payer: Medicaid Other

## 2020-05-13 ENCOUNTER — Telehealth: Payer: Self-pay | Admitting: Family Medicine

## 2020-05-19 ENCOUNTER — Ambulatory Visit: Payer: Medicaid Other | Admitting: Gastroenterology

## 2020-06-01 ENCOUNTER — Ambulatory Visit (HOSPITAL_COMMUNITY): Payer: Medicaid Other

## 2020-06-09 ENCOUNTER — Other Ambulatory Visit: Payer: Self-pay | Admitting: Family Medicine

## 2020-06-09 DIAGNOSIS — E039 Hypothyroidism, unspecified: Secondary | ICD-10-CM

## 2020-07-21 ENCOUNTER — Ambulatory Visit (HOSPITAL_COMMUNITY): Payer: Medicaid Other

## 2020-07-22 ENCOUNTER — Telehealth: Payer: Self-pay | Admitting: Family Medicine

## 2020-07-26 ENCOUNTER — Encounter: Payer: Self-pay | Admitting: Family Medicine

## 2020-07-26 ENCOUNTER — Telehealth: Payer: Medicaid Other | Admitting: Family Medicine

## 2020-07-26 DIAGNOSIS — F3341 Major depressive disorder, recurrent, in partial remission: Secondary | ICD-10-CM | POA: Diagnosis not present

## 2020-07-26 DIAGNOSIS — F410 Panic disorder [episodic paroxysmal anxiety] without agoraphobia: Secondary | ICD-10-CM | POA: Diagnosis not present

## 2020-07-26 DIAGNOSIS — E039 Hypothyroidism, unspecified: Secondary | ICD-10-CM

## 2020-07-26 DIAGNOSIS — R7989 Other specified abnormal findings of blood chemistry: Secondary | ICD-10-CM | POA: Diagnosis not present

## 2020-07-26 MED ORDER — BUPROPION HCL ER (XL) 300 MG PO TB24: 1.0000 | ORAL_TABLET | Freq: Every day | ORAL | 3 refills | Status: DC

## 2020-07-26 MED ORDER — CLONAZEPAM 0.5 MG PO TABS: ORAL_TABLET | ORAL | 5 refills | Status: DC

## 2020-07-26 MED ORDER — ESCITALOPRAM OXALATE 20 MG PO TABS: 20.0000 mg | ORAL_TABLET | Freq: Every day | ORAL | 3 refills | Status: DC

## 2020-07-26 NOTE — Progress Notes (Signed)
MyChart Video visit  Subjective: CC: Follow-up anxiety depression PCP: [name], DO ZDG:Erin Hale is a 65 y.o. female. Patient provides verbal consent for consult held via video.  Due to COVID-19 pandemic this visit was conducted virtually. This visit type was conducted due to national recommendations for restrictions regarding the COVID-19 Pandemic (e.g. social distancing, sheltering in place) in an effort to limit this patient's exposure and mitigate transmission in our community. All issues noted in this document were discussed and addressed.  A physical exam was not performed with this format.   Location of patient: Home Location of provider: [facility] Others present for call: None  1.  Anxiety depression Patient reports compliance with her Lexapro, Wellbutrin and uses Klonopin fairly regularly.  Denies any excessive daytime sedation, falls, respiratory pression, visual auditory hallucinations.  Symptoms seem to be relatively well controlled with these medications.  Continues to take pain medication for her back from her specialist.  Has not had any complications with the benzodiazepine plus opioid   ROS: Per HPI  No Known Allergies Past Medical History:  Diagnosis Date  . Bronchial asthma   . Depression   . GERD (gastroesophageal reflux disease)   . Hypothyroidism   . Panic disorder   . Thyroid disease    hypothyroidism    Current Outpatient Medications:  .  acetaminophen (TYLENOL) 650 MG CR tablet, Take 650 mg by mouth every 8 (eight) hours as needed for pain. , Disp: , Rfl:  .  albuterol (VENTOLIN HFA) 108 (90 Base) MCG/ACT inhaler, Inhale 2 puffs into the lungs every 6 (six) hours as needed for wheezing or shortness of breath. (Patient taking differently: Inhale 2 puffs into the lungs every 6 (six) hours as needed for wheezing or shortness of breath. ), Disp: 18 g, Rfl: 0 .  Ascorbic Acid (VITAMIN C) 100 MG tablet, Take 100 mg by mouth daily., Disp: , Rfl:  .   azelastine (ASTELIN) 0.1 % nasal spray, Place 1 spray into both nostrils 2 (two) times daily., Disp: 30 mL, Rfl: 12 .  buPROPion (WELLBUTRIN XL) 300 MG 24 hr tablet, Take 1 tablet by mouth once daily, Disp: 90 tablet, Rfl: 0 .  clonazePAM (KLONOPIN) 0.5 MG tablet, TAKE 1 TABLET BY MOUTH TWICE DAILY AS NEEDED FOR ANXIETY (PANIC), Disp: 60 tablet, Rfl: 5 .  diphenhydrAMINE (BENADRYL) 25 mg capsule, Take 25 mg by mouth every 6 (six) hours as needed., Disp: , Rfl:  .  escitalopram (LEXAPRO) 20 MG tablet, Take 1 tablet (20 mg total) by mouth daily., Disp: 90 tablet, Rfl: 3 .  levothyroxine (SYNTHROID) 88 MCG tablet, Take 1 tablet by mouth once daily, Disp: 90 tablet, Rfl: 1 .  Melatonin 5 MG CAPS, Take 5 mg by mouth. , Disp: , Rfl:  .  Multiple Vitamins-Minerals (VITAMIN D3 COMPLETE PO), Take by mouth., Disp: , Rfl:  .  pantoprazole (PROTONIX) 40 MG tablet, Take 1 tablet (40 mg total) by mouth daily. Take 30 minutes before breakfast, Disp: 90 tablet, Rfl: 3 .  simethicone (MYLICON) 433 MG chewable tablet, Chew 250 mg by mouth as needed for flatulence. , Disp: , Rfl:   General: Well-appearing, well-nourished female.  No acute distress Psych: Mood stable, speech normal.  Patient is jovial.  Assessment/ Plan: 65 y.o. female   Panic attack - Plan: clonazePAM (KLONOPIN) 0.5 MG tablet  Recurrent major depressive disorder, in partial remission ([place]) - Plan: clonazePAM (KLONOPIN) 0.5 MG tablet, buPROPion (WELLBUTRIN XL) 300 MG 24 hr tablet, escitalopram (  LEXAPRO) 20 MG tablet  Acquired hypothyroidism - Plan: TSH, T4, Free  Elevated serum creatinine - Plan: Basic metabolic panel  Panic attacks stable with prn use of Clonazepam.  Continue Lexapro, Wellbutrin.  National narcotic database was reviewed and there were no red flags.  She has controlled substances for pain prescribed by her pain specialist  Plan for TSH, free T4, BMP at next visit.  This is scheduled for July  Start time: 12:52pm (phone);  1:56pm End time: 1:02pm (could not connect to video); 2:01pm  Total time spent on patient care (including video visit/ documentation): 15 minutes  [place], [place] [phone]

## 2020-09-21 ENCOUNTER — Telehealth: Payer: Self-pay | Admitting: Family Medicine

## 2020-09-22 ENCOUNTER — Ambulatory Visit: Payer: Medicaid Other | Admitting: Family Medicine

## 2020-11-09 ENCOUNTER — Telehealth: Payer: Self-pay

## 2020-11-09 MED ORDER — CLONAZEPAM 0.5 MG PO TABS: ORAL_TABLET | ORAL | 1 refills | Status: DC

## 2020-11-09 NOTE — Telephone Encounter (Signed)
Last fill 9/4. I have sent rx with 2 refills.  Needs OV in PERSON.  Last OV 07/26/20 and was video.  Please make sure she is scheduled.

## 2020-11-09 NOTE — Telephone Encounter (Signed)
Wanted to let you know I just got off the phone with one of the nurses at [facility] about this patient.  It is eerily similar the [employer] they have and the one we have.  Theirs has an address of [place] [address] [place], her ss ends with [phone] and she is Khamille Spickerman. [place].  This may explain some of the confusion between [facility] and [employer].  I am going to call both pharmacies and give them the information I have received from [facility] and that it is not the same Nancy Marus using both pharmacies and getting meds from different providers.

## 2020-11-09 NOTE — Telephone Encounter (Signed)
Received call from [place], one of the pharmacists at [facility].  Patient has been refilling Clonzepam 0.5 mg there but she has also been filling Clonazepam 1 mg at [facility] that is prescribed by Barton Fanny from [facility], her pain doctor. She told pharmacist at [facility] that her identity had been stolen but when I spoke with Stu at [facility] to verify, he said she has been getting med from them since at least June of 2021.  I also called [facility] and have left them a message to return my call so they will know what is going on.  I have told [facility] to cancel any refills they have on file for her from our office.

## 2020-11-09 NOTE — Telephone Encounter (Signed)
Yes, thank you for cancelling refills under my name.  She absolutely should NOT have both prescriptions.  This has VOIDED our controlled substance contract.

## 2020-11-09 NOTE — Telephone Encounter (Signed)
I spoke with the patient and she says she only gets her medications at [facility]. She is not getting any medications from [facility], the only thing she has ever gotten from [facility] was a vaccine.

## 2020-11-09 NOTE — Telephone Encounter (Signed)
Joe from [facility] called--asked to talk to nurse regarding pt clonazePAM (KLONOPIN) 0.5 MG tablet. Please call back

## 2020-11-09 NOTE — Telephone Encounter (Signed)
Please confirm that she has not been picking up the other lady's meds, as BOTH '1mg'$  and 0.'5mg'$  are listed under her name in the [place] narcotic database.

## 2020-11-09 NOTE — Telephone Encounter (Signed)
Wanted to let you know I just got off the phone with one of the nurses at [facility] about this patient.  It is eerily similar the [employer] they have and the one we have.  Theirs has an address of [place] [address] [place], her ss ends with [phone] and she is Erin Hale. [place].  This may explain some of the confusion between [facility] and [employer].  I am going to call both pharmacies and give them the information I have received from [facility] and that it is not the same Erin Hale using both pharmacies and getting meds from different providers.

## 2020-11-09 NOTE — Addendum Note (Signed)
Addended by: [name] on: 11/09/2020 03:35 PM   Modules accepted: Orders

## 2020-11-09 NOTE — Telephone Encounter (Signed)
I spoke with Joe the pharmacist at [facility] and also verified with Stu at [facility] that 2 patients have the same name in [place] but this pt Erin Hale is not the same one getting refills on Clonazepam at [facility] so can you please send over a new rx for her with refills to [facility].

## 2020-11-09 NOTE — Telephone Encounter (Signed)
That'd be great.  Really need to figure out how to rectify the  Controlled substance reports on their end since it's being submitted under the wrong patient.

## 2020-11-30 ENCOUNTER — Ambulatory Visit: Payer: Medicaid Other | Admitting: Gastroenterology

## 2020-12-13 ENCOUNTER — Other Ambulatory Visit: Payer: Self-pay | Admitting: Family Medicine

## 2020-12-13 DIAGNOSIS — E039 Hypothyroidism, unspecified: Secondary | ICD-10-CM

## 2020-12-15 ENCOUNTER — Other Ambulatory Visit: Payer: Self-pay | Admitting: Family Medicine

## 2020-12-15 DIAGNOSIS — E039 Hypothyroidism, unspecified: Secondary | ICD-10-CM

## 2020-12-17 ENCOUNTER — Other Ambulatory Visit: Payer: Self-pay | Admitting: Family Medicine

## 2020-12-17 ENCOUNTER — Ambulatory Visit: Payer: Medicaid Other | Admitting: Family Medicine

## 2020-12-17 ENCOUNTER — Telehealth: Payer: Self-pay | Admitting: Family Medicine

## 2020-12-17 DIAGNOSIS — E039 Hypothyroidism, unspecified: Secondary | ICD-10-CM

## 2020-12-28 ENCOUNTER — Other Ambulatory Visit: Payer: Self-pay

## 2020-12-28 ENCOUNTER — Ambulatory Visit (INDEPENDENT_AMBULATORY_CARE_PROVIDER_SITE_OTHER): Payer: Medicaid Other | Admitting: Family Medicine

## 2020-12-28 ENCOUNTER — Encounter: Payer: Self-pay | Admitting: Family Medicine

## 2020-12-28 VITALS — BP 117/87 | HR 109 | Temp 98.2°F | Ht 64.0 in | Wt 270.0 lb

## 2020-12-28 DIAGNOSIS — Z79899 Other long term (current) drug therapy: Secondary | ICD-10-CM | POA: Diagnosis not present

## 2020-12-28 DIAGNOSIS — E039 Hypothyroidism, unspecified: Secondary | ICD-10-CM | POA: Diagnosis not present

## 2020-12-28 DIAGNOSIS — F3341 Major depressive disorder, recurrent, in partial remission: Secondary | ICD-10-CM

## 2020-12-28 DIAGNOSIS — F410 Panic disorder [episodic paroxysmal anxiety] without agoraphobia: Secondary | ICD-10-CM | POA: Diagnosis not present

## 2020-12-28 MED ORDER — CLONAZEPAM 0.5 MG PO TABS: ORAL_TABLET | ORAL | 5 refills | Status: DC

## 2020-12-28 MED ORDER — LEVOTHYROXINE SODIUM 88 MCG PO TABS: 88.0000 ug | ORAL_TABLET | Freq: Every day | ORAL | 0 refills | Status: DC

## 2020-12-28 NOTE — Patient Instructions (Signed)
You had labs performed today.  You will be contacted with the results of the labs once they are available, usually in the next 3 business days for routine lab work.  If you have an active my chart account, they will be released to your MyChart.  If you prefer to have these labs released to you via telephone, please let us know.     

## 2020-12-28 NOTE — Addendum Note (Signed)
Addended by: Everlean Cherry on: 12/28/2020 03:20 PM   Modules accepted: Orders

## 2020-12-28 NOTE — Progress Notes (Signed)
Subjective: CC:f/u panic/ hypothyroidism PCP: Raliegh Ip, DO LIR:ONJAR E Faraone is a 65 y.o. female presenting to clinic today for:  1. Hypothyroidism Compliant with Synthroid.  No change in voice, no difficulty swallowing, no heart palpitations, no change in bowel habits  2. Panic attacks Has as needed Klonopin on hand if needed.  Compliant with Wellbutrin, Lexapro.  Does not report any excessive daytime sedation or falls.   ROS: Per HPI  No Known Allergies Past Medical History:  Diagnosis Date   Bronchial asthma    Depression    GERD (gastroesophageal reflux disease)    Hypothyroidism    Panic disorder    Thyroid disease    hypothyroidism    Current Outpatient Medications:    acetaminophen (TYLENOL) 650 MG CR tablet, Take 650 mg by mouth every 8 (eight) hours as needed for pain. , Disp: , Rfl:    albuterol (VENTOLIN HFA) 108 (90 Base) MCG/ACT inhaler, Inhale 2 puffs into the lungs every 6 (six) hours as needed for wheezing or shortness of breath. (Patient taking differently: Inhale 2 puffs into the lungs every 6 (six) hours as needed for wheezing or shortness of breath. ), Disp: 18 g, Rfl: 0   Ascorbic Acid (VITAMIN C) 100 MG tablet, Take 100 mg by mouth daily., Disp: , Rfl:    azelastine (ASTELIN) 0.1 % nasal spray, Place 1 spray into both nostrils 2 (two) times daily., Disp: 30 mL, Rfl: 12   buPROPion (WELLBUTRIN XL) 300 MG 24 hr tablet, Take 1 tablet (300 mg total) by mouth daily., Disp: 90 tablet, Rfl: 3   clonazePAM (KLONOPIN) 0.5 MG tablet, TAKE 1 TABLET BY MOUTH TWICE DAILY AS NEEDED FOR ANXIETY (PANIC), Disp: 60 tablet, Rfl: 1   diphenhydrAMINE (BENADRYL) 25 mg capsule, Take 25 mg by mouth every 6 (six) hours as needed., Disp: , Rfl:    escitalopram (LEXAPRO) 20 MG tablet, Take 1 tablet (20 mg total) by mouth daily., Disp: 90 tablet, Rfl: 3   levothyroxine (SYNTHROID) 88 MCG tablet, Take 1 tablet by mouth once daily, Disp: 90 tablet, Rfl: 0   Melatonin 5 MG  CAPS, Take 5 mg by mouth. , Disp: , Rfl:    Multiple Vitamins-Minerals (VITAMIN D3 COMPLETE PO), Take by mouth., Disp: , Rfl:    pantoprazole (PROTONIX) 40 MG tablet, Take 1 tablet (40 mg total) by mouth daily. Take 30 minutes before breakfast, Disp: 90 tablet, Rfl: 3   simethicone (MYLICON) 125 MG chewable tablet, Chew 250 mg by mouth as needed for flatulence. , Disp: , Rfl:  Social History   Socioeconomic History   Marital status: Divorced    Spouse name: Not on file   Number of children: Not on file   Years of education: Not on file   Highest education level: Not on file  Occupational History   Not on file  Tobacco Use   Smoking status: Former    Types: Cigarettes    Quit date: 03/15/1994    Years since quitting: 26.8   Smokeless tobacco: Never  Vaping Use   Vaping Use: Never used  Substance and Sexual Activity   Alcohol use: No   Drug use: No   Sexual activity: Not on file  Other Topics Concern   Not on file  Social History Narrative   Not on file   Social Determinants of Health   Financial Resource Strain: Not on file  Food Insecurity: Not on file  Transportation Needs: Not on file  Physical Activity:  Not on file  Stress: Not on file  Social Connections: Not on file  Intimate Partner Violence: Not on file   Family History  Problem Relation Age of Onset   Stroke Mother    Congestive Heart Failure Mother    Diabetes Father    Cancer Father        brain   Colon cancer Neg Hx    Colon polyps Neg Hx     Objective: Office vital signs reviewed. BP 117/87   Pulse (!) 109   Temp 98.2 F (36.8 C)   Ht $R'5\' 4"'fS$  (1.626 m)   Wt 270 lb (122.5 kg)   LMP  (LMP Unknown)   SpO2 94%   BMI 46.35 kg/m   Physical Examination:  General: Awake, alert, well nourished, No acute distress HEENT: Normal, sclera white, no exophthalmos.  No goiter Cardio: regular rate and rhythm, S1S2 heard, no murmurs appreciated Pulm: clear to auscultation bilaterally, no wheezes, rhonchi or  rales; normal work of breathing on room air MSK: normal gait and station Skin: dry; intact; no rashes or lesions Neuro: No tremor  Assessment/ Plan: 65 y.o. female   Panic attack - Plan: clonazePAM (KLONOPIN) 0.5 MG tablet, ToxASSURE Select 13 (MW), Urine  Controlled substance agreement signed - Plan: ToxASSURE Select 13 (MW), Urine  Acquired hypothyroidism - Plan: TSH, T4, Free, CMP14+EGFR, levothyroxine (SYNTHROID) 88 MCG tablet  Recurrent major depressive disorder, in partial remission ([place]) - Plan: clonazePAM (KLONOPIN) 0.5 MG tablet, CMP14+EGFR  Chronic prescription benzodiazepine use - Plan: ToxASSURE Select 13 (MW), Urine  Klonopin renewed.  National narcotic database was reviewed and there were no red flags.  She does have to Nancy Marus is listed on her National narcotic database but I have discussed this with the other PCP prescribing for the other Nancy Marus and they are intact to different people.  CSA and UDS were obtained as per office policy today  Check thyroid levels.  No orders of the defined types were placed in this encounter.  No orders of the defined types were placed in this encounter.    [name], DO [place] [phone]

## 2020-12-29 LAB — TSH: TSH: 6.34 u[IU]/mL — ABNORMAL HIGH (ref 0.450–4.500)

## 2020-12-29 LAB — CMP14+EGFR
ALT: 37 IU/L — ABNORMAL HIGH (ref 0–32)
AST: 36 IU/L (ref 0–40)
Albumin/Globulin Ratio: 2 (ref 1.2–2.2)
Albumin: 4.8 g/dL (ref 3.8–4.8)
Alkaline Phosphatase: 151 IU/L — ABNORMAL HIGH (ref 44–121)
BUN/Creatinine Ratio: 13 (ref 12–28)
BUN: 16 mg/dL (ref 8–27)
Bilirubin Total: 0.4 mg/dL (ref 0.0–1.2)
CO2: 21 mmol/L (ref 20–29)
Calcium: 9.4 mg/dL (ref 8.7–10.3)
Chloride: 99 mmol/L (ref 96–106)
Creatinine, Ser: 1.22 mg/dL — ABNORMAL HIGH (ref 0.57–1.00)
Globulin, Total: 2.4 g/dL (ref 1.5–4.5)
Glucose: 107 mg/dL — ABNORMAL HIGH (ref 70–99)
Potassium: 4.7 mmol/L (ref 3.5–5.2)
Sodium: 140 mmol/L (ref 134–144)
Total Protein: 7.2 g/dL (ref 6.0–8.5)
eGFR: 50 mL/min/{1.73_m2} — ABNORMAL LOW (ref >59)

## 2020-12-29 LAB — T4, FREE: Free T4: 1.76 ng/dL (ref 0.82–1.77)

## 2021-01-06 ENCOUNTER — Other Ambulatory Visit: Payer: Self-pay

## 2021-01-06 DIAGNOSIS — E039 Hypothyroidism, unspecified: Secondary | ICD-10-CM

## 2021-01-06 MED ORDER — LEVOTHYROXINE SODIUM 100 MCG PO TABS: 100.0000 ug | ORAL_TABLET | Freq: Every day | ORAL | 1 refills | Status: DC

## 2021-01-11 LAB — DRUG SCREEN 10 W/CONF, SERUM
Amphetamines, IA: NEGATIVE ng/mL
Barbiturates, IA: NEGATIVE ug/mL
Benzodiazepines, IA: POSITIVE ng/mL — AB
Cocaine & Metabolite, IA: NEGATIVE ng/mL
Methadone, IA: NEGATIVE ng/mL
Opiates, IA: NEGATIVE ng/mL
Oxycodones, IA: NEGATIVE ng/mL
Phencyclidine, IA: NEGATIVE ng/mL
Propoxyphene, IA: NEGATIVE ng/mL
THC(Marijuana) Metabolite, IA: NEGATIVE ng/mL

## 2021-01-11 LAB — BENZODIAZEPINES,MS,WB/SP RFX
7-Aminoclonazepam: 24 ng/mL
Alprazolam: NEGATIVE ng/mL
Benzodiazepines Confirm: POSITIVE
Chlordiazepoxide: NEGATIVE
Clonazepam: 11.9 ng/mL
Desalkylflurazepam: NEGATIVE ng/mL
Desmethylchlordiazepoxide: NEGATIVE
Desmethyldiazepam: NEGATIVE ng/mL
Diazepam: NEGATIVE ng/mL
Flurazepam: NEGATIVE ng/mL
Lorazepam: NEGATIVE ng/mL
Midazolam: NEGATIVE ng/mL
Oxazepam: NEGATIVE ng/mL
Temazepam: NEGATIVE ng/mL
Triazolam: NEGATIVE ng/mL

## 2021-03-13 ENCOUNTER — Other Ambulatory Visit: Payer: Self-pay | Admitting: Family Medicine

## 2021-03-28 ENCOUNTER — Telehealth: Payer: Medicaid Other

## 2021-03-29 ENCOUNTER — Ambulatory Visit (INDEPENDENT_AMBULATORY_CARE_PROVIDER_SITE_OTHER): Payer: Medicaid Other | Admitting: Family Medicine

## 2021-03-29 ENCOUNTER — Encounter: Payer: Self-pay | Admitting: Family Medicine

## 2021-03-29 DIAGNOSIS — J029 Acute pharyngitis, unspecified: Secondary | ICD-10-CM | POA: Diagnosis not present

## 2021-03-29 DIAGNOSIS — U071 COVID-19: Secondary | ICD-10-CM | POA: Diagnosis not present

## 2021-03-29 DIAGNOSIS — J069 Acute upper respiratory infection, unspecified: Secondary | ICD-10-CM | POA: Diagnosis not present

## 2021-03-29 DIAGNOSIS — R509 Fever, unspecified: Secondary | ICD-10-CM

## 2021-03-29 LAB — RAPID STREP SCREEN (MED CTR MEBANE ONLY): Strep Gp A Ag, IA W/Reflex: NEGATIVE

## 2021-03-29 LAB — VERITOR FLU A/B WAIVED
Influenza A: NEGATIVE
Influenza B: NEGATIVE

## 2021-03-29 LAB — CULTURE, GROUP A STREP

## 2021-03-29 NOTE — Progress Notes (Signed)
Virtual Visit via telephone Note Due to COVID-19 pandemic this visit was conducted virtually. This visit type was conducted due to national recommendations for restrictions regarding the COVID-19 Pandemic (e.g. social distancing, sheltering in place) in an effort to limit this patient's exposure and mitigate transmission in our community. All issues noted in this document were discussed and addressed.  A physical exam was not performed with this format.   I connected with Erin Hale on 03/29/2021 at 1500 by telepone and verified that I am speaking with the correct person using two identifiers. Erin Hale is currently located at home and patient is currently with them during visit. The provider, Monia Pouch, FNP is located in their office at time of visit.  I discussed the limitations, risks, security and privacy concerns of performing an evaluation and management service by virtual visit and the availability of in person appointments. I also discussed with the patient that there may be a patient responsible charge related to this service. The patient expressed understanding and agreed to proceed.  Subjective:  Patient ID: Erin Hale, female    DOB: December 17, 1955, 66 y.o.   MRN: [id number]  Chief Complaint:  Sore Throat   HPI: Erin Hale is a 66 y.o. female presenting on 03/29/2021 for Sore Throat   Sore Throat  This is a new problem. Episode onset: Sunday. The problem has been gradually worsening. Neither side of throat is experiencing more pain than the other. The maximum temperature recorded prior to her arrival was 102 - 102.9 F. The fever has been present for 1 to 2 days. The pain is at a severity of 5/10. The pain is moderate. Associated symptoms include congestion, coughing, ear pain, swollen glands and trouble swallowing. Pertinent negatives include no abdominal pain, diarrhea, drooling, ear discharge, headaches, hoarse voice, plugged ear sensation, neck pain, shortness of  breath, stridor or vomiting. She has tried acetaminophen and NSAIDs for the symptoms. The treatment provided mild relief.  Fever  This is a new problem. Episode onset: Sunday. The problem has been waxing and waning. The maximum temperature noted was 102 to 102.9 F. The temperature was taken using an oral thermometer. Associated symptoms include congestion, coughing, ear pain, muscle aches and a sore throat. Pertinent negatives include no abdominal pain, chest pain, diarrhea, headaches, nausea, rash, sleepiness, urinary pain, vomiting or wheezing. She has tried acetaminophen and NSAIDs for the symptoms. The treatment provided moderate relief.    Relevant past medical, surgical, family, and social history reviewed and updated as indicated.  Allergies and medications reviewed and updated.   Past Medical History:  Diagnosis Date   Bronchial asthma    Depression    GERD (gastroesophageal reflux disease)    Hypothyroidism    Panic disorder    Thyroid disease    hypothyroidism    Past Surgical History:  Procedure Laterality Date   ABDOMINAL HYSTERECTOMY     BIOPSY  01/22/2018   Procedure: BIOPSY;  Surgeon: Danie Binder, MD;  [facility]: [facility];  Service: Endoscopy;;  (colon)   COLONOSCOPY WITH PROPOFOL N/A 01/22/2018   Procedure: COLONOSCOPY WITH PROPOFOL;  Surgeon: Danie Binder, MD;  Location: [facility];  Service: Endoscopy;  Laterality: N/A;  1:30pm   ESOPHAGOGASTRODUODENOSCOPY (EGD) WITH PROPOFOL N/A 01/22/2018   Procedure: ESOPHAGOGASTRODUODENOSCOPY (EGD) WITH PROPOFOL;  Surgeon: Danie Binder, MD;  Location: [facility];  Service: Endoscopy;  Laterality: N/A;   POLYPECTOMY  01/22/2018   Procedure: POLYPECTOMY;  Surgeon: Oneida Alar,  Marga Melnick, MD;  Location: [facility];  Service: Endoscopy;;  (colon)   SAVORY DILATION N/A 01/22/2018   Procedure: SAVORY DILATION;  Surgeon: Danie Binder, MD;  Location: [facility];  Service: Endoscopy;  Laterality: N/A;    Social  History   Socioeconomic History   Marital status: Divorced    Spouse name: Not on file   Number of children: Not on file   Years of education: Not on file   Highest education level: Not on file  Occupational History   Not on file  Tobacco Use   Smoking status: Former    Types: Cigarettes    Quit date: 03/15/1994    Years since quitting: 27.0   Smokeless tobacco: Never  Vaping Use   Vaping Use: Never used  Substance and Sexual Activity   Alcohol use: No   Drug use: No   Sexual activity: Not on file  Other Topics Concern   Not on file  Social History Narrative   Not on file   Social Determinants of Health   Financial Resource Strain: Not on file  Food Insecurity: Not on file  Transportation Needs: Not on file  Physical Activity: Not on file  Stress: Not on file  Social Connections: Not on file  Intimate Partner Violence: Not on file    Outpatient Encounter Medications as of 03/29/2021  Medication Sig   acetaminophen (TYLENOL) 650 MG CR tablet Take 650 mg by mouth every 8 (eight) hours as needed for pain.    albuterol (VENTOLIN HFA) 108 (90 Base) MCG/ACT inhaler Inhale 2 puffs into the lungs every 6 (six) hours as needed for wheezing or shortness of breath.   Ascorbic Acid (VITAMIN C) 100 MG tablet Take 100 mg by mouth daily.   azelastine (ASTELIN) 0.1 % nasal spray Place 1 spray into both nostrils 2 (two) times daily.   buPROPion (WELLBUTRIN XL) 300 MG 24 hr tablet Take 1 tablet (300 mg total) by mouth daily.   clonazePAM (KLONOPIN) 0.5 MG tablet TAKE 1 TABLET BY MOUTH TWICE DAILY AS NEEDED FOR ANXIETY (PANIC)   diphenhydrAMINE (BENADRYL) 25 mg capsule Take 25 mg by mouth every 6 (six) hours as needed.   escitalopram (LEXAPRO) 20 MG tablet Take 1 tablet (20 mg total) by mouth daily.   levothyroxine (SYNTHROID) 100 MCG tablet Take 1 tablet (100 mcg total) by mouth daily.   Melatonin 5 MG CAPS Take 5 mg by mouth.    Multiple Vitamins-Minerals (VITAMIN D3 COMPLETE PO) Take  by mouth.   pantoprazole (PROTONIX) 40 MG tablet TAKE 1 TABLET BY MOUTH ONCE DAILY 30 MINUTES  BEFORE BREAKFAST   simethicone (MYLICON) 400 MG chewable tablet Chew 250 mg by mouth as needed for flatulence.    No facility-administered encounter medications on file as of 03/29/2021.    No Known Allergies  Review of Systems  Constitutional:  Positive for activity change, appetite change, chills, fatigue and fever. Negative for diaphoresis and unexpected weight change.  HENT:  Positive for congestion, ear pain, rhinorrhea, sore throat and trouble swallowing. Negative for dental problem, drooling, ear discharge, facial swelling, hoarse voice, mouth sores, nosebleeds, postnasal drip, sinus pressure, sinus pain, sneezing, tinnitus and voice change.   Respiratory:  Positive for cough. Negative for shortness of breath, wheezing and stridor.   Cardiovascular:  Negative for chest pain, palpitations and leg swelling.  Gastrointestinal:  Negative for abdominal pain, diarrhea, nausea and vomiting.  Genitourinary:  Negative for decreased urine volume, difficulty urinating and dysuria.  Musculoskeletal:  Positive for myalgias. Negative for arthralgias and neck pain.  Skin:  Negative for rash.  Neurological:  Negative for dizziness, tremors, seizures, syncope, facial asymmetry, speech difficulty, weakness, light-headedness, numbness and headaches.  Psychiatric/Behavioral:  Negative for confusion.   All other systems reviewed and are negative.       Observations/Objective: No vital signs or physical exam, this was a virtual health encounter.  Pt alert and oriented, answers all questions appropriately, and able to speak in full sentences.    Assessment and Plan: Anysha was seen today for sore throat.  Diagnoses and all orders for this visit:  Sore throat Fever and chills URI with cough and congestion Will test for influenza, COVID and strep. Further treatment pending results. Fever and pain control  along with adequate hydration discussed in detail. Will notify pt with further treatment plans once labs result.  -     Veritor Flu A/B Waived -     Rapid Strep Screen ([facility] ONLY) -     Novel Coronavirus, NAA ([facility])     Follow Up Instructions: Return if symptoms worsen or fail to improve.    I discussed the assessment and treatment plan with the patient. The patient was provided an opportunity to ask questions and all were answered. The patient agreed with the plan and demonstrated an understanding of the instructions.   The patient was advised to call back or seek an in-person evaluation if the symptoms worsen or if the condition fails to improve as anticipated.  The above assessment and management plan was discussed with the patient. The patient verbalized understanding of and has agreed to the management plan. Patient is aware to call the clinic if they develop any new symptoms or if symptoms persist or worsen. Patient is aware when to return to the clinic for a follow-up visit. Patient educated on when it is appropriate to go to the emergency department.    I provided 15 minutes of time during this telephone encounter.   Monia Pouch, FNP-C [facility] [address] [place], [place] [address] [phone] 03/29/2021

## 2021-03-30 LAB — SARS-COV-2, NAA 2 DAY TAT

## 2021-03-30 LAB — NOVEL CORONAVIRUS, NAA: SARS-CoV-2, NAA: DETECTED — AB

## 2021-03-30 MED ORDER — MOLNUPIRAVIR EUA 200MG CAPSULE: 4.0000 | ORAL_CAPSULE | Freq: Two times a day (BID) | ORAL | 0 refills | Status: AC

## 2021-03-30 NOTE — Addendum Note (Signed)
Addended by: Baruch Gouty on: 03/30/2021 02:54 PM   Modules accepted: Orders

## 2021-04-14 ENCOUNTER — Other Ambulatory Visit: Payer: Medicaid Other

## 2021-04-14 ENCOUNTER — Other Ambulatory Visit: Payer: Self-pay

## 2021-04-14 ENCOUNTER — Other Ambulatory Visit: Payer: Self-pay | Admitting: Family Medicine

## 2021-04-14 DIAGNOSIS — U071 COVID-19: Secondary | ICD-10-CM

## 2021-04-15 LAB — NOVEL CORONAVIRUS, NAA: SARS-CoV-2, NAA: NOT DETECTED

## 2021-07-06 ENCOUNTER — Telehealth (INDEPENDENT_AMBULATORY_CARE_PROVIDER_SITE_OTHER): Payer: Medicare Other | Admitting: Family Medicine

## 2021-07-06 NOTE — Progress Notes (Signed)
Failed to connect. Will reschedule for Friday in person ? ? ?

## 2021-07-08 ENCOUNTER — Encounter: Payer: Self-pay | Admitting: Family Medicine

## 2021-07-08 ENCOUNTER — Ambulatory Visit (INDEPENDENT_AMBULATORY_CARE_PROVIDER_SITE_OTHER): Payer: Medicare Other | Admitting: Family Medicine

## 2021-07-08 VITALS — BP 124/86 | HR 104 | Temp 98.1°F | Ht 64.0 in | Wt 266.6 lb

## 2021-07-08 DIAGNOSIS — E039 Hypothyroidism, unspecified: Secondary | ICD-10-CM | POA: Diagnosis not present

## 2021-07-08 DIAGNOSIS — R140 Abdominal distension (gaseous): Secondary | ICD-10-CM

## 2021-07-08 DIAGNOSIS — F3341 Major depressive disorder, recurrent, in partial remission: Secondary | ICD-10-CM

## 2021-07-08 DIAGNOSIS — G8929 Other chronic pain: Secondary | ICD-10-CM

## 2021-07-08 DIAGNOSIS — M545 Low back pain, unspecified: Secondary | ICD-10-CM | POA: Diagnosis not present

## 2021-07-08 DIAGNOSIS — K219 Gastro-esophageal reflux disease without esophagitis: Secondary | ICD-10-CM

## 2021-07-08 DIAGNOSIS — F410 Panic disorder [episodic paroxysmal anxiety] without agoraphobia: Secondary | ICD-10-CM | POA: Diagnosis not present

## 2021-07-08 MED ORDER — ESCITALOPRAM OXALATE 20 MG PO TABS: 20.0000 mg | ORAL_TABLET | Freq: Every day | ORAL | 3 refills | Status: DC

## 2021-07-08 MED ORDER — BUPROPION HCL ER (SR) 150 MG PO TB12: 150.0000 mg | ORAL_TABLET | Freq: Every day | ORAL | 0 refills | Status: DC

## 2021-07-08 MED ORDER — METHYLPREDNISOLONE ACETATE 40 MG/ML IJ SUSP
40.0000 mg | Freq: Once | INTRAMUSCULAR | Status: AC
  Administered 2021-07-08: 40 mg via INTRAMUSCULAR

## 2021-07-08 MED ORDER — LEVOTHYROXINE SODIUM 100 MCG PO TABS: 100.0000 ug | ORAL_TABLET | Freq: Every day | ORAL | 1 refills | Status: DC

## 2021-07-08 MED ORDER — PANTOPRAZOLE SODIUM 40 MG PO TBEC: DELAYED_RELEASE_TABLET | ORAL | 1 refills | Status: DC

## 2021-07-08 MED ORDER — CLONAZEPAM 0.5 MG PO TABS: ORAL_TABLET | ORAL | 5 refills | Status: DC

## 2021-07-08 MED ORDER — LIDOCAINE 5 % EX PTCH: 1.0000 | MEDICATED_PATCH | CUTANEOUS | 12 refills | Status: DC

## 2021-07-08 NOTE — Progress Notes (Signed)
? ?Subjective: ?CC: Follow-up anxiety depression ?PCP: [name], DO ?AOZ:HYQMV Erin Hale is a 66 y.o. female presenting to clinic today for: ? ?1.  Anxiety depression ?Patient is treated with Lexapro 20 mg daily, Wellbutrin extended release 300 mg daily and Klonopin 0.5 mg twice daily as needed.  She reports increased anxiety and feels that this is related to the Wellbutrin.  She had been cutting the extended release Wellbutrin in half for quite some time.  She just got a refill to 300 mg but is interested in reducing and ultimately getting off of the Wellbutrin.  She does not report any visual auditory hallucinations.  She reports depressive symptoms, particularly surrounding her impaired mobility in the setting of chronic back pain stable I will ? ?2.  Chronic back pain ?Patient with ongoing chronic back pain.  She does not want to get spinal injections.  She declines physical therapy today.  She would be willing to have a corticosteroid injection if this would help her be more mobile.  She points to the low back as the area of back pain.  Does not report any red flag signs or symptoms ? ?3.  GERD ?Patient reports abdominal bloating, discomfort.  She does not feel like she can tolerate much p.o. intake but is able to eat.  She has not seen gastroenterology in a while.  She is currently trying to take keto Gummies her weight loss. ? ?4.  Hypothyroidism ?Compliant with Synthroid 100 mcg daily.  No reports of tremor.  She reports difficulty with weight loss and abdominal bloating as above.  She reports increased anxiety and depressive symptoms. ? ? ?ROS: Per HPI ? ?No Known Allergies ?Past Medical History:  ?Diagnosis Date  ? Bronchial asthma   ? Depression   ? GERD (gastroesophageal reflux disease)   ? Hypothyroidism   ? Panic disorder   ? Thyroid disease   ? hypothyroidism  ? ? ?Current Outpatient Medications:  ?  acetaminophen (TYLENOL) 650 MG CR tablet, Take 650 mg by mouth every 8 (eight) hours as  needed for pain. , Disp: , Rfl:  ?  albuterol (VENTOLIN HFA) 108 (90 Base) MCG/ACT inhaler, Inhale 2 puffs into the lungs every 6 (six) hours as needed for wheezing or shortness of breath., Disp: 18 g, Rfl: 0 ?  Ascorbic Acid (VITAMIN C) 100 MG tablet, Take 100 mg by mouth daily., Disp: , Rfl:  ?  azelastine (ASTELIN) 0.1 % nasal spray, Place 1 spray into both nostrils 2 (two) times daily., Disp: 30 mL, Rfl: 12 ?  buPROPion (WELLBUTRIN SR) 150 MG 12 hr tablet, Take 1 tablet (150 mg total) by mouth daily. Tapering off., Disp: 30 tablet, Rfl: 0 ?  diphenhydrAMINE (BENADRYL) 25 mg capsule, Take 25 mg by mouth every 6 (six) hours as needed., Disp: , Rfl:  ?  lidocaine (LIDODERM) 5 %, Place 1 patch onto the skin daily. To the area of back pain. Remove & Discard patch within 12 hours or as directed by MD, Disp: 30 patch, Rfl: 12 ?  Melatonin 5 MG CAPS, Take 5 mg by mouth. , Disp: , Rfl:  ?  Multiple Vitamins-Minerals (VITAMIN D3 COMPLETE PO), Take by mouth., Disp: , Rfl:  ?  simethicone (MYLICON) 784 MG chewable tablet, Chew 250 mg by mouth as needed for flatulence. , Disp: , Rfl:  ?  clonazePAM (KLONOPIN) 0.5 MG tablet, TAKE 1 TABLET BY MOUTH TWICE DAILY AS NEEDED FOR ANXIETY (PANIC), Disp: 60 tablet, Rfl: 5 ?  escitalopram (LEXAPRO) 20 MG tablet, Take 1 tablet (20 mg total) by mouth daily., Disp: 90 tablet, Rfl: 3 ?  levothyroxine (SYNTHROID) 100 MCG tablet, Take 1 tablet (100 mcg total) by mouth daily., Disp: 90 tablet, Rfl: 1 ?  pantoprazole (PROTONIX) 40 MG tablet, TAKE 1 TABLET BY MOUTH ONCE DAILY 30 MINUTES  BEFORE BREAKFAST, Disp: 90 tablet, Rfl: 1 ?Social History  ? ?Socioeconomic History  ? Marital status: Divorced  ?  Spouse name: Not on file  ? Number of children: Not on file  ? Years of education: Not on file  ? Highest education level: Not on file  ?Occupational History  ? Not on file  ?Tobacco Use  ? Smoking status: Former  ?  Types: Cigarettes  ?  Quit date: 03/15/1994  ?  Years since quitting: 27.3  ?  Smokeless tobacco: Never  ?Vaping Use  ? Vaping Use: Never used  ?Substance and Sexual Activity  ? Alcohol use: No  ? Drug use: No  ? Sexual activity: Not on file  ?Other Topics Concern  ? Not on file  ?Social History Narrative  ? Not on file  ? ?Social Determinants of Health  ? ?Financial Resource Strain: Not on file  ?Food Insecurity: Not on file  ?Transportation Needs: Not on file  ?Physical Activity: Not on file  ?Stress: Not on file  ?Social Connections: Not on file  ?Intimate Partner Violence: Not on file  ? ?Family History  ?Problem Relation Age of Onset  ? Stroke Mother   ? Congestive Heart Failure Mother   ? Diabetes Father   ? Cancer Father   ?     brain  ? Colon cancer Neg Hx   ? Colon polyps Neg Hx   ? ? ?Objective: ?Office vital signs reviewed. ?BP 124/86   Pulse (!) 104   Temp 98.1 ?F (36.7 ?C)   Ht '5\' 4"'$  (1.626 m)   Wt 266 lb 9.6 oz (120.9 kg)   LMP  (LMP Unknown)   SpO2 95%   BMI 45.76 kg/m?  ? ?Physical Examination:  ?General: Awake, alert, morbidly obese, No acute distress ?HEENT: Sclera white ?Cardio: regular rate and rhythm, S1S2 heard, no murmurs appreciated ?Pulm: clear to auscultation bilaterally, no wheezes, rhonchi or rales; normal work of breathing on room air ?MSK: Ambulating independently but gait is antalgic ?Psych: Mood depressed and is somewhat anxious but patient very pleasant, interactive ? ? ?  07/08/2021  ?  3:34 PM 12/28/2020  ?  2:59 PM 02/24/2020  ? 10:42 AM  ?Depression screen PHQ 2/9  ?Decreased Interest '1 2 1  '$ ?Down, Depressed, Hopeless '1 1 1  '$ ?PHQ - 2 Score '2 3 2  '$ ?Altered sleeping '3 3 1  '$ ?Tired, decreased energy '3 3 1  '$ ?Change in appetite '2 3 1  '$ ?Feeling bad or failure about yourself  2 2 0  ?Trouble concentrating 0 0 0  ?Moving slowly or fidgety/restless 0 0 0  ?Suicidal thoughts  0 0  ?PHQ-9 Score '12 14 5  '$ ?Difficult doing work/chores Somewhat difficult Somewhat difficult Somewhat difficult  ? ? ?  07/08/2021  ?  3:35 PM 12/28/2020  ?  2:59 PM 01/27/2020  ?  3:55 PM  06/20/2019  ?  1:45 PM  ?GAD 7 : Generalized Anxiety Score  ?Nervous, Anxious, on Edge '1 2 2 1  '$ ?Control/stop worrying '2 3 3 2  '$ ?Worry too much - different things '2 3 3 2  '$ ?Trouble relaxing '1 1 2 2  '$ ?  Restless 0 0 1 0  ?Easily annoyed or irritable '1 1 1 1  '$ ?Afraid - awful might happen '2 2 3 1  '$ ?Total GAD 7 Score '9 12 15 9  '$ ?Anxiety Difficulty Somewhat difficult Somewhat difficult Very difficult Somewhat difficult  ? ?Assessment/ Plan: ?66 y.o. female  ? ?Panic attack - Plan: clonazePAM (KLONOPIN) 0.5 MG tablet ? ?Recurrent major depressive disorder, in partial remission ([place]) - Plan: clonazePAM (KLONOPIN) 0.5 MG tablet, buPROPion (WELLBUTRIN SR) 150 MG 12 hr tablet, escitalopram (LEXAPRO) 20 MG tablet ? ?Chronic bilateral low back pain without sciatica - Plan: methylPREDNISolone acetate (DEPO-MEDROL) injection 40 mg, lidocaine (LIDODERM) 5 % ? ?Acquired hypothyroidism - Plan: TSH, T4, Free ? ?Gastroesophageal reflux disease, unspecified whether esophagitis present - Plan: Ambulatory referral to Gastroenterology ? ?Abdominal bloating - Plan: Ambulatory referral to Gastroenterology ? ?Pain attack and anxiety are somewhat exacerbated as of late.  She seems attribute this to the Wellbutrin and is interested in tapering off of it.  I have placed her on the Wellbutrin sustained-release 150 mg to take daily.  She will continue Lexapro.  We did discuss consideration for referral to psychiatry and psychology but she seems reluctant to do this at this time ? ?For her chronic back pain I offered referral to physical therapy and/or spinal surgery for further evaluation and management.  Today she asked for corticosteroid injection to help alleviate the pain.  I have also sent lidocaine patches to the affected area as needed ? ?Check thyroid levels. ? ?Referral back to gastroenterology for abdominal symptoms.  The so far refractory to Protonix, simethicone ? ?Orders Placed This Encounter  ?Procedures  ? TSH  ? T4, Free  ?  Ambulatory referral to Gastroenterology  ?  Referral Priority:   Routine  ?  Referral Type:   Consultation  ?  Referral Reason:   Specialty Services Required  ?  Number of Visits Requested:   1  ? ?Meds ordered this encounter

## 2021-07-09 LAB — TSH: TSH: 1.95 u[IU]/mL (ref 0.450–4.500)

## 2021-07-09 LAB — T4, FREE: Free T4: 1.68 ng/dL (ref 0.82–1.77)

## 2021-07-11 ENCOUNTER — Telehealth: Payer: Self-pay

## 2021-07-11 NOTE — Telephone Encounter (Signed)
Lidocaine 5% patches ? ? ?Key: [id number] - PA Case ID: [id number] - Rx #: [id number] ? ?Sent to plan  ?

## 2021-07-12 NOTE — Telephone Encounter (Signed)
Enrollee's Name: ?[name] ?Member Number: ?[id number] ?Your request was denied ?We have denied coverage or payment under your Medicare Part D benefit for the following  ?prescription drug(s) that you or your prescriber requested: LIDOCAINE Patch 5% ?Why did we deny your request? ?We denied this request under Medicare Part D because: ?This drug used for Low back pain, unspecified is not an approved use. Medicare Part D rules  ?states the drug must be used for a ?medically-accepted indication?[name] ?medically-accepted  ?indication? means a use that is approved by the Food and Drug Administration (FDA), or a use  ?supported by specific resources. These are the [facility] Formulary Service Drug  ?Information and [place]. Therefore, this drug cannot be covered under  ?your Medicare Part D benefit. ?Criteria used to make decision: ?The criteria we used to make this decision is the Centers of Medicare and Medicaid Services  ?(CMS) guidelines-Prescription Drug Benefit Manual (Chapter 6, 10.2 Covered Part D Drug) ?

## 2021-07-12 NOTE — Telephone Encounter (Signed)
Please inform patient to get SalonPas patches OTC since not covered. ?

## 2021-07-14 ENCOUNTER — Other Ambulatory Visit: Payer: Self-pay | Admitting: Family Medicine

## 2021-07-14 ENCOUNTER — Encounter: Payer: Self-pay | Admitting: Internal Medicine

## 2021-07-14 DIAGNOSIS — F410 Panic disorder [episodic paroxysmal anxiety] without agoraphobia: Secondary | ICD-10-CM

## 2021-07-14 DIAGNOSIS — F3341 Major depressive disorder, recurrent, in partial remission: Secondary | ICD-10-CM

## 2021-07-30 ENCOUNTER — Other Ambulatory Visit: Payer: Self-pay | Admitting: Family Medicine

## 2021-07-30 DIAGNOSIS — F3341 Major depressive disorder, recurrent, in partial remission: Secondary | ICD-10-CM

## 2021-08-18 ENCOUNTER — Ambulatory Visit: Payer: Medicaid Other | Admitting: Gastroenterology

## 2021-08-19 ENCOUNTER — Ambulatory Visit: Payer: Medicare Other | Admitting: Family Medicine

## 2021-09-19 ENCOUNTER — Ambulatory Visit: Payer: Medicaid Other | Admitting: Gastroenterology

## 2021-10-24 ENCOUNTER — Encounter: Payer: Self-pay | Admitting: *Deleted

## 2021-10-24 ENCOUNTER — Ambulatory Visit (INDEPENDENT_AMBULATORY_CARE_PROVIDER_SITE_OTHER): Payer: Medicare Other | Admitting: Gastroenterology

## 2021-10-24 ENCOUNTER — Encounter: Payer: Self-pay | Admitting: Gastroenterology

## 2021-10-24 VITALS — BP 137/81 | HR 96 | Temp 98.0°F | Ht 64.0 in | Wt 264.4 lb

## 2021-10-24 DIAGNOSIS — R10812 Left upper quadrant abdominal tenderness: Secondary | ICD-10-CM | POA: Diagnosis not present

## 2021-10-24 DIAGNOSIS — R140 Abdominal distension (gaseous): Secondary | ICD-10-CM | POA: Diagnosis not present

## 2021-10-24 DIAGNOSIS — K219 Gastro-esophageal reflux disease without esophagitis: Secondary | ICD-10-CM

## 2021-10-24 DIAGNOSIS — D261 Other benign neoplasm of corpus uteri: Secondary | ICD-10-CM | POA: Diagnosis not present

## 2021-10-24 NOTE — Patient Instructions (Addendum)
Avoid gas-producing foods (eg, cabbage, legumes, onions, broccoli, brussel sprouts, wheat, and potatoes, celery, carrots, raisins, bananas, apricots, prunes, pretzels, and bagels ). Continue to avoid dairy products including cheeses. You may take Gas-ex or phaszyme as needed. Increasing exercises and movement has been shown to help decrease gas as well. I have also attached handouts for your reference.   Continue weight loss efforts. We can refer to [facility] if you desire.  Follow a GERD diet:  Avoid fried, fatty, greasy, spicy, citrus foods. Avoid caffeine and carbonated beverages. Avoid chocolate. Do not eat within 3 hours of laying down. Prop head of bed up on wood or bricks to create a 6 inch incline.   In order to control your reflux including some regurgitation of food, is important that you take your pantoprazole 40 mg daily on a regular basis.  If you have difficulty remembering to take your medication, setting a timer or reminder on your phone is a good trick in order to get into the habit of taking the medication.  As long as you take on empty stomach at least 30 minutes prior to eating it should absorb appropriately.  We will get you scheduled for CT of your abdomen and pelvis to evaluate your abdominal distention and LUQ tenderness.   It was great to see you today!  We will have you follow-up in about 3 to 4 months.  I will be in touch with the results of your CT scan once obtained.  It was a pleasure to see you today. I want to create trusting relationships with patients. If you receive a survey regarding your visit,  I greatly appreciate you taking time to fill this out on paper or through your MyChart. I value your feedback.  Venetia Night, MSN, FNP-BC, AGACNP-BC [facility]

## 2021-10-24 NOTE — Progress Notes (Signed)
GI Office Note    Referring Provider: [name], DO Primary Care Physician:  [name], DO  Primary Gastroenterologist: Dr. Abbey Chatters  Chief Complaint   Chief Complaint  Patient presents with   [facility] in stomach area. IBS is giving her problems. Soft stools.     History of Present Illness   Erin Hale is a 66 y.o. female presenting today at the request of Ronnie Doss M, DO for reflux and bloating.   Last seen in the clinic September 2019.  She was seen for bloating, weight loss, early satiety, diarrhea.  Reported bloating to upper abdomen sometimes taking Dulcolax.  Reported cheese and sweets worsened her symptoms.  Had been off work on [employer] due to her symptoms.  Also having ongoing nausea, Gas-X sometimes was helpful.  Reported feeling like symptoms food is backing up in her chest regurgitates.  Reflux is better with changing her diet.  Reported constipation with soft serve small amounts but also better with her diet.  Reported scant blood with straining occurring once.  Was only taking Zantac at the time.  She reported a 30 pound weight loss but had been recently gaining weight.  She was scheduled for colonoscopy and EGD/ED.  She was started on PPI daily.  And advised to stop Zantac.  EGD and colonoscopy November 2019.  Colonoscopy with 6 mm tubular adenoma at splenic flexure, benign submucosal nodule at hepatic flexure, external and internal hemorrhoids, redundant left colon.  Advise high-fiber diet repeat colonoscopy in 5-10 years.  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 pylori.  Advised to follow a high-fiber lactose-free diet.  Offered referral to weight loss clinic.  On recall for colonoscopy in November 2024.  Today:  Notes that she has a hiatal hernia. She would like to have a scan of her abdomen. 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 She wants a scan to keep herself at ease. Has regular bowel movements that are soft balls of stool and sometimes reports it is like "thick soup". Bowel movements are pretty much every day but never large amounts. Denies melena or hematochezia. Sometimes does have to strain. Reports with sweets her stools seem gooey. Reports her weight has been stable. Reports some days she has to make herself eat but feels like that may be her nerves. Does not have abdominal pain but does have abdominal distention and bloating and fullness feeling that is better in the mornings and worse throughout the day. Reports feeling like her left side of her abdomen is bulging at times. At times has some belching and some regurgitation. Denies burning sensation. Taking her pantoprazole 40 mg once daily but not always everyday. Once in a while takes some dulcolax.  Denies frequent consumption of beans. Eats cereal and cheese crackers. Uses lactose free milk and sometimes 2% milk. Has not ben using gas-ex. Flatulence helps her have relief.    Current Outpatient Medications  Medication Sig Dispense Refill   acetaminophen (TYLENOL) 650 MG CR tablet Take 650 mg by mouth every 8 (eight) hours as needed for pain.      albuterol (VENTOLIN HFA) 108 (90 Base) MCG/ACT inhaler Inhale 2 puffs into the lungs every 6 (six) hours as needed for wheezing or shortness of breath. 18 g 0   Ascorbic Acid (VITAMIN C) 100 MG tablet  Take 100 mg by mouth daily.     azelastine (ASTELIN) 0.1 % nasal spray Place 1 spray into both nostrils 2 (two) times daily. 30 mL 12   clonazePAM (KLONOPIN) 0.5 MG tablet TAKE 1 TABLET BY MOUTH TWICE DAILY AS NEEDED FOR ANXIETY (PANIC) 60 tablet 5   diphenhydrAMINE (BENADRYL) 25 mg capsule Take 25 mg by mouth every 6 (six) hours as needed.     escitalopram (LEXAPRO) 20 MG tablet Take 1 tablet (20 mg total) by mouth daily. 90 tablet 3   levothyroxine (SYNTHROID) 100 MCG  tablet Take 1 tablet (100 mcg total) by mouth daily. 90 tablet 1   Multiple Vitamins-Minerals (VITAMIN D3 COMPLETE PO) Take by mouth.     pantoprazole (PROTONIX) 40 MG tablet TAKE 1 TABLET BY MOUTH ONCE DAILY 30 MINUTES  BEFORE BREAKFAST 90 tablet 1   buPROPion (WELLBUTRIN SR) 150 MG 12 hr tablet Take 1 tablet (150 mg total) by mouth daily. Tapering off. (Patient not taking: Reported on 10/24/2021) 30 tablet 0   lidocaine (LIDODERM) 5 % Place 1 patch onto the skin daily. To the area of back pain. Remove & Discard patch within 12 hours or as directed by MD (Patient not taking: Reported on 10/24/2021) 30 patch 12   Melatonin 5 MG CAPS Take 5 mg by mouth.  (Patient not taking: Reported on 10/24/2021)     simethicone (MYLICON) 643 MG chewable tablet Chew 250 mg by mouth as needed for flatulence.  (Patient not taking: Reported on 10/24/2021)     No current facility-administered medications for this visit.    Past Medical History:  Diagnosis Date   Bronchial asthma    Depression    GERD (gastroesophageal reflux disease)    Hypothyroidism    Panic disorder    Thyroid disease    hypothyroidism    Past Surgical History:  Procedure Laterality Date   ABDOMINAL HYSTERECTOMY     BIOPSY  01/22/2018   Procedure: BIOPSY;  Surgeon: Danie Binder, MD;  [facility];  Service: Endoscopy;;  (colon)   COLONOSCOPY WITH PROPOFOL N/A 01/22/2018   Procedure: COLONOSCOPY WITH PROPOFOL;  Surgeon: Danie Binder, MD;  [facility];  Service: Endoscopy;  Laterality: N/A;  1:30pm   ESOPHAGOGASTRODUODENOSCOPY (EGD) WITH PROPOFOL N/A 01/22/2018   Procedure: ESOPHAGOGASTRODUODENOSCOPY (EGD) WITH PROPOFOL;  Surgeon: Danie Binder, MD;  [facility];  Service: Endoscopy;  Laterality: N/A;   POLYPECTOMY  01/22/2018   Procedure: POLYPECTOMY;  Surgeon: Danie Binder, MD;  [facility];  Service: Endoscopy;;  (colon)   SAVORY DILATION N/A 01/22/2018   Procedure: SAVORY  DILATION;  Surgeon: Danie Binder, MD;  [facility];  Service: Endoscopy;  Laterality: N/A;    Family History  Problem Relation Age of Onset   Stroke Mother    Congestive Heart Failure Mother    Diabetes Father    Cancer Father        brain   Colon cancer Neg Hx    Colon polyps Neg Hx     Allergies as of 10/24/2021   (No Known Allergies)    Social History   Socioeconomic History   Marital status: Divorced    Spouse name: Not on file   Number of children: Not on file   Years of education: Not on file   Highest education level: Not on file  Occupational History   Not on file  Tobacco Use   Smoking status: Former  Types: Cigarettes    Quit date: 03/15/1994    Years since quitting: 27.6   Smokeless tobacco: Never  Vaping Use   Vaping Use: Never used  Substance and Sexual Activity   Alcohol use: No   Drug use: No   Sexual activity: Not on file  Other Topics Concern   Not on file  Social History Narrative   Not on file   Social Determinants of Health   Financial Resource Strain: Not on file  Food Insecurity: Not on file  Transportation Needs: Not on file  Physical Activity: Not on file  Stress: Not on file  Social Connections: Not on file  Intimate Partner Violence: Not on file     Review of Systems   Gen: Denies any fever, chills, fatigue, weight loss, lack of appetite.  CV: Denies chest pain, heart palpitations, peripheral edema, syncope.  Resp: Denies shortness of breath at rest or with exertion. Denies wheezing or cough.  GI: see HPI GU : Denies urinary burning, urinary frequency, urinary hesitancy MS: Denies joint pain, muscle weakness, cramps, or limitation of movement.  Derm: Denies rash, itching, dry skin Psych: Denies depression, anxiety, memory loss, and confusion Heme: Denies bruising, bleeding, and enlarged lymph nodes.   Physical Exam   BP 137/81 (BP Location: Right Arm, Patient Position: Sitting, Cuff Size: Large)   Pulse  96   Temp 98 F (36.7 C) (Temporal)   Ht '5\' 4"'$  (1.626 m)   Wt 264 lb 6.4 oz (119.9 kg)   LMP  (LMP Unknown)   BMI 45.38 kg/m   General:   Alert and oriented. Pleasant and cooperative. Well-nourished and well-developed.  Head:  Normocephalic and atraumatic. Ears:  Normal auditory acuity. Mouth:  No deformity or lesions, oral mucosa pink.  Lungs:  Clear to auscultation bilaterally. No wheezes, rales, or rhonchi. No distress.  Heart:  S1, S2 present without murmurs appreciated.  Abdomen:  +BS, soft, rounded without obvious distention, tenderness to left upper quadrant and left flank.  No HSM noted. No guarding or rebound. No masses appreciated.  Rectal:  Deferred  Msk:  Symmetrical without gross deformities. Normal posture. Extremities:  Without edema. Neurologic:  Alert and  oriented x4;  grossly normal neurologically. Skin:  Intact without significant lesions or rashes. Psych:  Alert and cooperative. Normal mood and affect.   Assessment   Erin Hale is a 66 y.o. female with a history of anxiety, depression, chronic back pain, GERD, hypothyroid, and asthma presenting today with complaint of bloating and reflux.   GERD: Currently taking pantoprazole on a non consistent basis.  Denies burning sensation in her throat but does admit to intermittent regurgitation at times.  Does have some vague nausea complaints and a little bit of lack of appetite however she thinks this may be related to her nerves.  Typically has about 1 meal a day and that is usually in the evening.  Reinforced GERD lifestyle modifications and to continue weight loss efforts.  Advise more consistency with taking pantoprazole 40 mg daily.  Can consider increasing to twice daily dosing if symptoms continue.  Abdominal distention/bloating/LUQ tenderness: She denies any overt abdominal pain but feels like her abdomen protrudes further in the left side than the right side and at times feels a firmness to her left upper  quadrant as well as some tenderness.  Reports bloating is usually better in the morning time and gets worse throughout the day and does have improvement after passing flatulence.  Not currently taking  any over-the-counter medications.  Reports no change in bowel habits, having soft formed bowel movements almost daily, very infrequent straining.  Reports she has been following a lactose-free diet and does eat cereal with milk but uses lactose-free milk and intermittent consumption of cheese.  Vaguely admitted to intermittent consumption of things like pizza.  Not being consistent with taking her pantoprazole.  Suspect combination of dietary choices is contributing factor to her symptoms.  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  Given her tenderness in her past patient reports she tried to get a CT scan done a few years ago but was unable to and would like to have this completed to give herself some peace of mind.  No obvious mass found on abdominal exam today, likely may have small ventral hernia with muscle separation but nothing overt.  She has present bowel sounds.  We discussed avoiding gas producing foods and following a low FODMAP diet, handouts provided.  Advised her she may take Gas-X or Phazyme as needed With her symptoms and increasing her exercise and weight loss efforts may also improve her symptoms.  We will schedule CT abdomen pelvis per patient's request.  PLAN   CT abdomen pelvis with contrast Pantoprazole 40 mg daily, be more consistent with dosing.  GERD lifestyle modifications reinforced today.  Avoid gas producing foods, low FODMAP.  Gas-ex or phazyme as needed.  Follow up in 3 to 4 months   Venetia Night, MSN, FNP-BC, AGACNP-BC [facility]

## 2021-11-16 ENCOUNTER — Other Ambulatory Visit: Payer: Self-pay | Admitting: Family Medicine

## 2021-11-18 ENCOUNTER — Ambulatory Visit (HOSPITAL_COMMUNITY): Payer: Medicare Other

## 2021-11-18 ENCOUNTER — Other Ambulatory Visit: Payer: Self-pay | Admitting: Family Medicine

## 2021-11-18 DIAGNOSIS — F410 Panic disorder [episodic paroxysmal anxiety] without agoraphobia: Secondary | ICD-10-CM

## 2021-11-18 DIAGNOSIS — F3341 Major depressive disorder, recurrent, in partial remission: Secondary | ICD-10-CM

## 2021-11-21 ENCOUNTER — Other Ambulatory Visit: Payer: Self-pay | Admitting: Family Medicine

## 2021-11-24 ENCOUNTER — Other Ambulatory Visit: Payer: Self-pay | Admitting: Family Medicine

## 2021-11-24 DIAGNOSIS — F3341 Major depressive disorder, recurrent, in partial remission: Secondary | ICD-10-CM

## 2021-11-24 DIAGNOSIS — F410 Panic disorder [episodic paroxysmal anxiety] without agoraphobia: Secondary | ICD-10-CM

## 2021-11-25 ENCOUNTER — Telehealth: Payer: Self-pay | Admitting: Family Medicine

## 2021-11-25 NOTE — Telephone Encounter (Signed)
Patient calling to check on status of this medicine. She said she has not been taking this medicine since 9/13 and she is going through withdraws. Please call back.

## 2021-11-25 NOTE — Telephone Encounter (Signed)
Patient aware and verbalized understanding. Patient aware to go to ER if starting to have a rough time she is aware Dr. Darnell Level is back next week

## 2021-11-25 NOTE — Telephone Encounter (Signed)
  Prescription Request  11/25/2021  Is this a "Controlled Substance" medicine? yes  Have you seen your PCP in the last 2 weeks? Appt 10/17  If YES, route message to pool  -  If NO, patient needs to be scheduled for appointment.  What is the name of the medication or equipment? clonazePAM (KLONOPIN) 0.5 MG tablet  Have you contacted your pharmacy to request a refill? Yes, [facility]    Which pharmacy would you like this sent to? [facility] in [place]    Patient notified that their request is being sent to the clinical staff for review and that they should receive a response within 2 business days.

## 2021-11-25 NOTE — Telephone Encounter (Signed)
Patient calling to check on meds to see if they are going to be called in. Needs today. Going through a bad time.

## 2021-12-20 ENCOUNTER — Ambulatory Visit: Payer: Medicare Other | Admitting: Family Medicine

## 2021-12-27 ENCOUNTER — Other Ambulatory Visit: Payer: Self-pay | Admitting: Family Medicine

## 2022-01-18 ENCOUNTER — Encounter: Payer: Self-pay | Admitting: Family Medicine

## 2022-01-18 ENCOUNTER — Ambulatory Visit (INDEPENDENT_AMBULATORY_CARE_PROVIDER_SITE_OTHER): Payer: Medicare Other | Admitting: Family Medicine

## 2022-01-18 ENCOUNTER — Ambulatory Visit (INDEPENDENT_AMBULATORY_CARE_PROVIDER_SITE_OTHER): Payer: Medicare Other

## 2022-01-18 VITALS — BP 121/85 | HR 100 | Temp 97.5°F | Ht 64.0 in | Wt 258.6 lb

## 2022-01-18 DIAGNOSIS — J410 Simple chronic bronchitis: Secondary | ICD-10-CM | POA: Diagnosis not present

## 2022-01-18 DIAGNOSIS — Z79899 Other long term (current) drug therapy: Secondary | ICD-10-CM | POA: Diagnosis not present

## 2022-01-18 DIAGNOSIS — R739 Hyperglycemia, unspecified: Secondary | ICD-10-CM | POA: Diagnosis not present

## 2022-01-18 DIAGNOSIS — F410 Panic disorder [episodic paroxysmal anxiety] without agoraphobia: Secondary | ICD-10-CM

## 2022-01-18 DIAGNOSIS — Z780 Asymptomatic menopausal state: Secondary | ICD-10-CM

## 2022-01-18 DIAGNOSIS — F411 Generalized anxiety disorder: Secondary | ICD-10-CM

## 2022-01-18 DIAGNOSIS — R5382 Chronic fatigue, unspecified: Secondary | ICD-10-CM | POA: Diagnosis not present

## 2022-01-18 DIAGNOSIS — F3341 Major depressive disorder, recurrent, in partial remission: Secondary | ICD-10-CM | POA: Diagnosis not present

## 2022-01-18 DIAGNOSIS — E039 Hypothyroidism, unspecified: Secondary | ICD-10-CM | POA: Diagnosis not present

## 2022-01-18 DIAGNOSIS — R233 Spontaneous ecchymoses: Secondary | ICD-10-CM

## 2022-01-18 DIAGNOSIS — R635 Abnormal weight gain: Secondary | ICD-10-CM | POA: Diagnosis not present

## 2022-01-18 MED ORDER — CLONAZEPAM 0.5 MG PO TABS: ORAL_TABLET | ORAL | 5 refills | Status: DC

## 2022-01-18 MED ORDER — ALBUTEROL SULFATE HFA 108 (90 BASE) MCG/ACT IN AERS: 2.0000 | INHALATION_SPRAY | Freq: Four times a day (QID) | RESPIRATORY_TRACT | 0 refills | Status: DC | PRN

## 2022-01-18 NOTE — Progress Notes (Signed)
Subjective: CC: Follow-up hypothyroidism, anxiety PCP: [name], DO EYC:[id number] Erin Hale is a 66 y.o. female presenting to clinic today for:  1.  Hypothyroidism Patient is compliant with Synthroid 100 mcg daily.  No reports of tremor, heart palpitations, changes in bowel habits.  She does report chronic fatigue and would like to have vitamin testing done today.  2.  Anxiety disorder associate with panic attack and depression Patient is compliant with Lexapro 20 mg daily.  Utilizes Klonopin fairly regularly.  Denies any excessive daytime sedation, falls, respiratory pression, visual auditory hallucinations.  No memory changes.  She did have an episode where she withdrew from medicine slightly because her pharmacy ran out of the medication and it apparently was backordered for several days.  She notes that she felt quite poorly during that time but that of course has since resolved that she has been back on medication.  She does report feeling anxious and depressed about the state of the world and frequently thinks about it is real and just how people are not kind to each other.  She prays a lot.   ROS: Per HPI  No Known Allergies Past Medical History:  Diagnosis Date   Bronchial asthma    Depression    GERD (gastroesophageal reflux disease)    Hypothyroidism    Panic disorder    Thyroid disease    hypothyroidism    Current Outpatient Medications:    acetaminophen (TYLENOL) 650 MG CR tablet, Take 650 mg by mouth every 8 (eight) hours as needed for pain. , Disp: , Rfl:    albuterol (VENTOLIN HFA) 108 (90 Base) MCG/ACT inhaler, Inhale 2 puffs into the lungs every 6 (six) hours as needed for wheezing or shortness of breath., Disp: 18 g, Rfl: 0   Ascorbic Acid (VITAMIN C) 100 MG tablet, Take 100 mg by mouth daily., Disp: , Rfl:    azelastine (ASTELIN) 0.1 % nasal spray, Place 1 spray into both nostrils 2 (two) times daily., Disp: 30 mL, Rfl: 12   clonazePAM (KLONOPIN) 0.5 MG  tablet, TAKE 1 TABLET BY MOUTH TWICE DAILY AS NEEDED FOR ANXIETY (PANIC), Disp: 60 tablet, Rfl: 5   diphenhydrAMINE (BENADRYL) 25 mg capsule, Take 25 mg by mouth every 6 (six) hours as needed., Disp: , Rfl:    escitalopram (LEXAPRO) 20 MG tablet, Take 1 tablet (20 mg total) by mouth daily., Disp: 90 tablet, Rfl: 3   levothyroxine (SYNTHROID) 100 MCG tablet, TAKE 1 TABLET BY MOUTH EVERY DAY, Disp: 90 tablet, Rfl: 1   Multiple Vitamins-Minerals (VITAMIN D3 COMPLETE PO), Take by mouth., Disp: , Rfl:    pantoprazole (PROTONIX) 40 MG tablet, TAKE 1 TABLET BY MOUTH ONCE DAILY 30 MINUTES BEFORE BREAKFAST, Disp: 90 tablet, Rfl: 1   simethicone (MYLICON) 856 MG chewable tablet, Chew 250 mg by mouth as needed for flatulence., Disp: , Rfl:  Social History   Socioeconomic History   Marital status: Divorced    Spouse name: Not on file   Number of children: Not on file   Years of education: Not on file   Highest education level: Not on file  Occupational History   Not on file  Tobacco Use   Smoking status: Former    Types: Cigarettes    Quit date: 03/15/1994    Years since quitting: 27.8   Smokeless tobacco: Never  Vaping Use   Vaping Use: Never used  Substance and Sexual Activity   Alcohol use: No   Drug use: No  Sexual activity: Not on file  Other Topics Concern   Not on file  Social History Narrative   Not on file   Social Determinants of Health   Financial Resource Strain: Not on file  Food Insecurity: Not on file  Transportation Needs: Not on file  Physical Activity: Not on file  Stress: Not on file  Social Connections: Not on file  Intimate Partner Violence: Not on file   Family History  Problem Relation Age of Onset   Stroke Mother    Congestive Heart Failure Mother    Diabetes Father    Cancer Father        brain   Colon cancer Neg Hx    Colon polyps Neg Hx     Objective: Office vital signs reviewed. BP 121/85   Pulse 100   Temp (!) 97.5 F (36.4 C)   Ht _0   (1.626 m)   Wt 258 lb 9.6 oz (117.3 kg)   LMP  (LMP Unknown)   SpO2 94%   BMI 44.39 kg/m   Physical Examination:  General: Awake, alert, nontoxic-appearing female, No acute distress HEENT: Sclera white.  No exophthalmos. Cardio: regular rate and rhythm, S1S2 heard, no murmurs appreciated Pulm: clear to auscultation bilaterally, no wheezes, rhonchi or rales; normal work of breathing on room air Psych: Appears somewhat anxious.  Mood slightly depressed but patient very pleasant, interactive with linear thought process.     01/18/2022    3:45 PM 07/08/2021    3:34 PM 12/28/2020    2:59 PM  Depression screen PHQ 2/9  Decreased Interest _1 Down, Depressed, Hopeless _2 PHQ - 2 Score _3 Altered sleeping _4 Tired, decreased energy _5 Change in appetite _6 Feeling bad or failure about yourself  _7 Trouble concentrating 1 0 0  Moving slowly or fidgety/restless 1 0 0  Suicidal thoughts 0  0  PHQ-9 Score _8 Difficult doing work/chores Not difficult at all Somewhat difficult Somewhat difficult      01/18/2022    3:45 PM 07/08/2021    3:35 PM 12/28/2020    2:59 PM 01/27/2020    3:55 PM  GAD 7 : Generalized Anxiety Score  Nervous, Anxious, on Edge _9 Control/stop worrying _10 Worry too much - different things _11 Trouble relaxing _12 Restless 0 0 0 1  Easily annoyed or irritable _13 Afraid - awful might happen _14 Total GAD 7 Score _15 Anxiety Difficulty Somewhat difficult Somewhat difficult Somewhat difficult Very difficult     Assessment/ Plan: 66 y.o. female   Generalized anxiety disorder with panic attacks - Plan: ToxASSURE Select 13 (MW), Urine, clonazePAM (KLONOPIN) 0.5 MG tablet  Recurrent major depressive disorder, in partial remission ([place]) - Plan: ToxASSURE Select 13 (MW), Urine, clonazePAM (KLONOPIN) 0.5 MG tablet, CMP14+EGFR  Controlled substance agreement signed - Plan: ToxASSURE Select 13  (MW), Urine  Acquired hypothyroidism - Plan: CMP14+EGFR, TSH, T4, Free  Severe obesity (BMI >= 40) (HCC) - Plan: CMP14+EGFR, LDL Cholesterol, Direct, TSH, T4, Free, Bayer DCA Hb A1c Waived  Asymptomatic postmenopausal estrogen deficiency - Plan: DG WR[facility] DEXA  Chronic fatigue - Plan: CBC, Vitamin B12  Easy bruisability - Plan: CBC  Simple chronic bronchitis (HCC) - Plan:  albuterol (VENTOLIN HFA) 108 (90 Base) MCG/ACT inhaler  Panic attacks are fairly stable but she continues to have generalized anxiety depressive disorders seem to be precipitated by just the state of world events.  I offered referral for therapy and psychiatry for medication management but she declined this at this time.  Her benzodiazepine has been renewed.  The national narcotic database reviewed and there were no red flags.  She unfortunately still has a merged file with a different Nancy Marus who is seen by a different provider.  Uncertain as to why they cannot get these files to be separated.  Up-to-date on UDS and CSC today.  Check TSH, free T4, CMP.  Again I doubt that the thyroid has anything to do with the depressive and anxiety symptoms but we will certainly evaluate for completion  She is nonfasting so direct LDL collected rather than lipid panel.  Check A1c as well given BMI over 40  Bone density scan completed today  Suspect chronic fatigue is secondary to uncontrolled anxiety depression but will evaluate for metabolic etiology with CBC and B12 level   No orders of the defined types were placed in this encounter.  No orders of the defined types were placed in this encounter.    [name], DO [place] [phone]

## 2022-01-19 LAB — CMP14+EGFR
ALT: 21 IU/L (ref 0–32)
AST: 25 IU/L (ref 0–40)
Albumin/Globulin Ratio: 1.8 (ref 1.2–2.2)
Albumin: 4.4 g/dL (ref 3.9–4.9)
Alkaline Phosphatase: 118 IU/L (ref 44–121)
BUN/Creatinine Ratio: 10 — ABNORMAL LOW (ref 12–28)
BUN: 13 mg/dL (ref 8–27)
Bilirubin Total: 0.6 mg/dL (ref 0.0–1.2)
CO2: 21 mmol/L (ref 20–29)
Calcium: 9.4 mg/dL (ref 8.7–10.3)
Chloride: 100 mmol/L (ref 96–106)
Creatinine, Ser: 1.28 mg/dL — ABNORMAL HIGH (ref 0.57–1.00)
Globulin, Total: 2.5 g/dL (ref 1.5–4.5)
Glucose: 111 mg/dL — ABNORMAL HIGH (ref 70–99)
Potassium: 4.1 mmol/L (ref 3.5–5.2)
Sodium: 140 mmol/L (ref 134–144)
Total Protein: 6.9 g/dL (ref 6.0–8.5)
eGFR: 46 mL/min/{1.73_m2} — ABNORMAL LOW (ref >59)

## 2022-01-19 LAB — CBC
Hematocrit: 47.8 % — ABNORMAL HIGH (ref 34.0–46.6)
Hemoglobin: 15.7 g/dL (ref 11.1–15.9)
MCH: 28.8 pg (ref 26.6–33.0)
MCHC: 32.8 g/dL (ref 31.5–35.7)
MCV: 88 fL (ref 79–97)
Platelets: 273 10*3/uL (ref 150–450)
RBC: 5.45 x10E6/uL — ABNORMAL HIGH (ref 3.77–5.28)
RDW: 13.1 % (ref 11.7–15.4)
WBC: 10.9 10*3/uL — ABNORMAL HIGH (ref 3.4–10.8)

## 2022-01-19 LAB — BAYER DCA HB A1C WAIVED: HB A1C (BAYER DCA - WAIVED): 5.5 % (ref 4.8–5.6)

## 2022-01-19 LAB — TSH: TSH: 1.46 u[IU]/mL (ref 0.450–4.500)

## 2022-01-19 LAB — LDL CHOLESTEROL, DIRECT: LDL Direct: 159 mg/dL — ABNORMAL HIGH (ref 0–99)

## 2022-01-19 LAB — VITAMIN B12: Vitamin B-12: 389 pg/mL (ref 232–1245)

## 2022-01-19 LAB — T4, FREE: Free T4: 2.11 ng/dL — ABNORMAL HIGH (ref 0.82–1.77)

## 2022-01-20 DIAGNOSIS — Z780 Asymptomatic menopausal state: Secondary | ICD-10-CM | POA: Diagnosis not present

## 2022-01-20 NOTE — Progress Notes (Signed)
Patient returning call. Please call back

## 2022-01-23 LAB — TOXASSURE SELECT 13 (MW), URINE

## 2022-01-23 NOTE — Progress Notes (Signed)
Patient returning call. Please call back

## 2022-01-29 NOTE — Progress Notes (Deleted)
GI Office Note    Referring Provider: [name], DO Primary Care Physician:  [name], DO Primary Gastroenterologist: Elon Alas. Abbey Chatters, DO   Date:  01/29/2022  ID:  Rubye Oaks, DOB 1955-12-31, MRN [id number]   Chief Complaint   No chief complaint on file.   History of Present Illness  Erin Hale is a 66 y.o. female with a history of anxiety, depression, chronic back pain, GERD, hypothyroid, and asthma*** presenting today for follow up.  EGD and colonoscopy November 2019.  Colonoscopy with 6 mm tubular adenoma at splenic flexure, benign submucosal nodule at hepatic flexure, external and internal hemorrhoids, redundant left colon.  Advise high-fiber diet repeat colonoscopy in 5-10 years.  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 pylori.  Advised to follow a high-fiber lactose-free diet.  Offered referral to weight loss clinic.   Last office visit 10/24/21. Requested CT scan of abdomen given her history of hernia that occurred after hysterectomy for large tumor in 2004. Reports soft balls of stool and sometimes it looking like thick soup. Incomplete emptying. Denied abdominal pain but primary complaint was distention, bloating, and fullness. Taking pantoprazole as needed. Uses lactose free mild or 2% milk. Flatulence gives relief. CT A/P ordered. Advised daily pantoprazole. Avoid high gas producing foods and follow low FODMAP diet. Gas ex as needed.    Current Outpatient Medications  Medication Sig Dispense Refill   acetaminophen (TYLENOL) 650 MG CR tablet Take 650 mg by mouth every 8 (eight) hours as needed for pain.      albuterol (VENTOLIN HFA) 108 (90 Base) MCG/ACT inhaler Inhale 2 puffs into the lungs every 6 (six) hours as needed for wheezing or shortness of breath. 18 g 0   Ascorbic Acid (VITAMIN C) 100 MG tablet Take 100 mg by mouth daily.      azelastine (ASTELIN) 0.1 % nasal spray Place 1 spray into both nostrils 2 (two) times daily. 30 mL 12   clonazePAM (KLONOPIN) 0.5 MG tablet TAKE 1 TABLET BY MOUTH TWICE DAILY AS NEEDED FOR ANXIETY (PANIC) 60 tablet 5   diphenhydrAMINE (BENADRYL) 25 mg capsule Take 25 mg by mouth every 6 (six) hours as needed.     escitalopram (LEXAPRO) 20 MG tablet Take 1 tablet (20 mg total) by mouth daily. 90 tablet 3   levothyroxine (SYNTHROID) 100 MCG tablet TAKE 1 TABLET BY MOUTH EVERY DAY 90 tablet 1   Multiple Vitamins-Minerals (VITAMIN D3 COMPLETE PO) Take by mouth.     pantoprazole (PROTONIX) 40 MG tablet TAKE 1 TABLET BY MOUTH ONCE DAILY 30 MINUTES BEFORE BREAKFAST 90 tablet 1   simethicone (MYLICON) 102 MG chewable tablet Chew 250 mg by mouth as needed for flatulence.     No current facility-administered medications for this visit.    Past Medical History:  Diagnosis Date   Bronchial asthma    Depression    GERD (gastroesophageal reflux disease)    Hypothyroidism    Panic disorder    Thyroid disease    hypothyroidism    Past Surgical History:  Procedure Laterality Date   ABDOMINAL HYSTERECTOMY     BIOPSY  01/22/2018   Procedure: BIOPSY;  Surgeon: Danie Binder, MD;  [facility];  Service: Endoscopy;;  (colon)   COLONOSCOPY WITH PROPOFOL N/A 01/22/2018   Procedure: COLONOSCOPY WITH PROPOFOL;  Surgeon: Danie Binder, MD;  [facility];  Service: Endoscopy;  Laterality: N/A;  1:30pm   ESOPHAGOGASTRODUODENOSCOPY (EGD) WITH PROPOFOL N/A 01/22/2018   Procedure: ESOPHAGOGASTRODUODENOSCOPY (EGD) WITH PROPOFOL;  Surgeon: Danie Binder, MD;  [facility];  Service: Endoscopy;  Laterality: N/A;   POLYPECTOMY  01/22/2018   Procedure: POLYPECTOMY;  Surgeon: Danie Binder, MD;  [facility];  Service: Endoscopy;;  (colon)   SAVORY DILATION N/A 01/22/2018   Procedure: SAVORY DILATION;  Surgeon: Danie Binder, MD;  [facility];  Service:  Endoscopy;  Laterality: N/A;    Family History  Problem Relation Age of Onset   Stroke Mother    Congestive Heart Failure Mother    Diabetes Father    Cancer Father        brain   Colon cancer Neg Hx    Colon polyps Neg Hx     Allergies as of 01/30/2022   (No Known Allergies)    Social History   Socioeconomic History   Marital status: Divorced    Spouse name: Not on file   Number of children: Not on file   Years of education: Not on file   Highest education level: Not on file  Occupational History   Not on file  Tobacco Use   Smoking status: Former    Types: Cigarettes    Quit date: 03/15/1994    Years since quitting: 27.8   Smokeless tobacco: Never  Vaping Use   Vaping Use: Never used  Substance and Sexual Activity   Alcohol use: No   Drug use: No   Sexual activity: Not on file  Other Topics Concern   Not on file  Social History Narrative   Not on file   Social Determinants of Health   Financial Resource Strain: Not on file  Food Insecurity: Not on file  Transportation Needs: Not on file  Physical Activity: Not on file  Stress: Not on file  Social Connections: Not on file     Review of Systems   Gen: Denies fever, chills, anorexia. Denies fatigue, weakness, weight loss.  CV: Denies chest pain, palpitations, syncope, peripheral edema, and claudication. Resp: Denies dyspnea at rest, cough, wheezing, coughing up blood, and pleurisy. GI: See HPI Derm: Denies rash, itching, dry skin Psych: Denies depression, anxiety, memory loss, confusion. No homicidal or suicidal ideation.  Heme: Denies bruising, bleeding, and enlarged lymph nodes.   Physical Exam   LMP  (LMP Unknown)   General:   Alert and oriented. No distress noted. Pleasant and cooperative.  Head:  Normocephalic and atraumatic. Eyes:  Conjuctiva clear without scleral icterus. Mouth:  Oral mucosa pink and moist. Good dentition. No lesions. Lungs:  Clear to auscultation bilaterally. No  wheezes, rales, or rhonchi. No distress.  Heart:  S1, S2 present without murmurs appreciated.  Abdomen:  +BS, soft, non-tender and non-distended. No rebound or guarding. No HSM or masses noted. Rectal: *** Msk:  Symmetrical without gross deformities. Normal posture. Extremities:  Without edema. Neurologic:  Alert and  oriented x4 Psych:  Alert and cooperative. Normal mood and affect.   Assessment  Erin Hale is a 66 y.o. female with a history of anxiety, depression, chronic back pain, GERD, hypothyroid, and asthma*** presenting today for follow up.  GERD:   Abdominal distention/bloating:   PLAN   *** Celiac panel for bloating?    Venetia Night, MSN, FNP-BC, AGACNP-BC [facility]

## 2022-01-30 ENCOUNTER — Ambulatory Visit: Payer: Medicare Other | Admitting: Gastroenterology

## 2022-02-03 ENCOUNTER — Telehealth: Payer: Self-pay | Admitting: Family Medicine

## 2022-02-03 ENCOUNTER — Other Ambulatory Visit: Payer: Self-pay | Admitting: Family Medicine

## 2022-02-03 DIAGNOSIS — E039 Hypothyroidism, unspecified: Secondary | ICD-10-CM

## 2022-02-03 NOTE — Telephone Encounter (Signed)
Pt aware.

## 2022-02-03 NOTE — Telephone Encounter (Signed)
Patient would like to speak to a nurse about her results from lab work and DEXA from 11/15 - aware that she was mailed a copy

## 2022-02-09 ENCOUNTER — Other Ambulatory Visit: Payer: Self-pay | Admitting: Family Medicine

## 2022-02-09 DIAGNOSIS — J410 Simple chronic bronchitis: Secondary | ICD-10-CM

## 2022-02-15 ENCOUNTER — Telehealth: Payer: Self-pay | Admitting: Family Medicine

## 2022-02-15 NOTE — Telephone Encounter (Signed)
Attempted to contact -Na  Need more information

## 2022-02-15 NOTE — Telephone Encounter (Signed)
Pt called requesting to speak with PCPs nurse. Has questions about her thyroid.

## 2022-02-15 NOTE — Telephone Encounter (Signed)
Pt r/c.

## 2022-02-17 ENCOUNTER — Telehealth: Payer: Self-pay | Admitting: Family Medicine

## 2022-02-17 DIAGNOSIS — E039 Hypothyroidism, unspecified: Secondary | ICD-10-CM

## 2022-02-17 NOTE — Telephone Encounter (Signed)
Patient would like to speak to someone about about her referral that was put in for a thyroid specialist. Please call back

## 2022-02-21 NOTE — Telephone Encounter (Signed)
Cut synthroid in half and take 1/2 tablet 4 days per week and 1 tablet 3 days per week.  Please schedule TSH, FT4 to be completed in 6 weeks.

## 2022-02-22 NOTE — Telephone Encounter (Signed)
PT r/c

## 2022-02-22 NOTE — Telephone Encounter (Signed)
Pt aware of provider feedback and voiced understanding. Also placed future labs for pt to come in 6 weeks to have drawn.

## 2022-04-24 ENCOUNTER — Encounter: Payer: Medicare Other | Admitting: Family Medicine

## 2022-05-02 ENCOUNTER — Encounter: Payer: Self-pay | Admitting: Family Medicine

## 2022-05-03 ENCOUNTER — Telehealth: Payer: Self-pay | Admitting: Family Medicine

## 2022-05-11 ENCOUNTER — Telehealth: Payer: Self-pay | Admitting: Family Medicine

## 2022-05-11 NOTE — Telephone Encounter (Signed)
Called patient to schedule Medicare Annual Wellness Visit (AWV). Left message for patient to call back and schedule Medicare Annual Wellness Visit (AWV).  Last date of AWV: due 05/05/2022 awvi per [facility]  Please schedule an appointment at any time with either Mickel Baas or [place], NHA's. .  If any questions, please contact me at [phone].  Thank you,  Colletta Maryland,  [place] Program Direct Dial ??[phone]

## 2022-05-15 NOTE — Telephone Encounter (Signed)
Called patient to schedule Medicare Annual Wellness Visit (AWV). Left message for patient to call back and schedule Medicare Annual Wellness Visit (AWV).  Last date of AWV: due 05/05/2022 awvi per [facility]  Please schedule an appointment at any time with either Mickel Baas or [place], NHA's. .  If any questions, please contact me at [phone].  Thank you,  Colletta Maryland,  [place] Program Direct Dial ??[phone]

## 2022-06-06 DIAGNOSIS — H25812 Combined forms of age-related cataract, left eye: Secondary | ICD-10-CM | POA: Diagnosis not present

## 2022-06-06 DIAGNOSIS — H43812 Vitreous degeneration, left eye: Secondary | ICD-10-CM | POA: Diagnosis not present

## 2022-06-06 DIAGNOSIS — H25811 Combined forms of age-related cataract, right eye: Secondary | ICD-10-CM | POA: Diagnosis not present

## 2022-06-13 ENCOUNTER — Telehealth: Payer: Self-pay | Admitting: Family Medicine

## 2022-06-13 NOTE — Telephone Encounter (Signed)
Called patient to schedule Medicare Annual Wellness Visit (AWV). Left message for patient to call back and schedule Medicare Annual Wellness Visit (AWV).  Last date of AWV: due 05/05/2022 awvi per [facility]  Please schedule an appointment at any time with either [place] or [place], NHA's. .  If any questions, please contact me at [phone].  Thank you,  Stephanie,  [facility]B Clinical Support [facility] AWV Program Direct Dial ??[phone]    

## 2022-06-19 ENCOUNTER — Other Ambulatory Visit: Payer: Medicare Other

## 2022-06-19 ENCOUNTER — Other Ambulatory Visit: Payer: Self-pay | Admitting: Family Medicine

## 2022-06-19 DIAGNOSIS — E039 Hypothyroidism, unspecified: Secondary | ICD-10-CM

## 2022-06-20 ENCOUNTER — Other Ambulatory Visit: Payer: Self-pay | Admitting: Family Medicine

## 2022-06-20 DIAGNOSIS — E039 Hypothyroidism, unspecified: Secondary | ICD-10-CM

## 2022-06-20 LAB — T4, FREE: Free T4: 1.81 ng/dL — ABNORMAL HIGH (ref 0.82–1.77)

## 2022-06-20 LAB — TSH: TSH: 4.48 u[IU]/mL (ref 0.450–4.500)

## 2022-06-20 MED ORDER — LEVOTHYROXINE SODIUM 50 MCG PO TABS: 50.0000 ug | ORAL_TABLET | Freq: Every day | ORAL | 0 refills | Status: DC

## 2022-06-22 DIAGNOSIS — H269 Unspecified cataract: Secondary | ICD-10-CM | POA: Diagnosis not present

## 2022-06-22 DIAGNOSIS — H25811 Combined forms of age-related cataract, right eye: Secondary | ICD-10-CM | POA: Diagnosis not present

## 2022-06-23 ENCOUNTER — Other Ambulatory Visit: Payer: Self-pay | Admitting: Family Medicine

## 2022-07-06 DIAGNOSIS — H269 Unspecified cataract: Secondary | ICD-10-CM | POA: Diagnosis not present

## 2022-07-06 DIAGNOSIS — H25812 Combined forms of age-related cataract, left eye: Secondary | ICD-10-CM | POA: Diagnosis not present

## 2022-07-11 ENCOUNTER — Telehealth: Payer: Self-pay | Admitting: Family Medicine

## 2022-07-11 NOTE — Telephone Encounter (Signed)
Called patient to schedule Medicare Annual Wellness Visit (AWV). Left message for patient to call back and schedule Medicare Annual Wellness Visit (AWV).  Last date of AWV: due 05/05/2022 awvi per [facility]  Please schedule an appointment at any time with Vernona Rieger, [facility]. .  If any questions, please contact me at [phone].  Thank you,  Judeth Cornfield,  AMB Clinical Support [facility] AWV Program Direct Dial ??[phone]

## 2022-07-19 ENCOUNTER — Telehealth: Payer: Self-pay | Admitting: Family Medicine

## 2022-07-19 ENCOUNTER — Ambulatory Visit (INDEPENDENT_AMBULATORY_CARE_PROVIDER_SITE_OTHER): Payer: Medicare Other | Admitting: Family Medicine

## 2022-07-19 ENCOUNTER — Encounter: Payer: Self-pay | Admitting: Family Medicine

## 2022-07-19 VITALS — BP 130/82 | HR 79 | Temp 98.7°F | Ht 64.0 in | Wt 265.0 lb

## 2022-07-19 DIAGNOSIS — F410 Panic disorder [episodic paroxysmal anxiety] without agoraphobia: Secondary | ICD-10-CM

## 2022-07-19 DIAGNOSIS — Z6841 Body Mass Index (BMI) 40.0 and over, adult: Secondary | ICD-10-CM | POA: Diagnosis not present

## 2022-07-19 DIAGNOSIS — Z5320 Procedure and treatment not carried out because of patient's decision for unspecified reasons: Secondary | ICD-10-CM

## 2022-07-19 DIAGNOSIS — E039 Hypothyroidism, unspecified: Secondary | ICD-10-CM | POA: Diagnosis not present

## 2022-07-19 DIAGNOSIS — H9193 Unspecified hearing loss, bilateral: Secondary | ICD-10-CM | POA: Diagnosis not present

## 2022-07-19 DIAGNOSIS — F3341 Major depressive disorder, recurrent, in partial remission: Secondary | ICD-10-CM

## 2022-07-19 DIAGNOSIS — Z2821 Immunization not carried out because of patient refusal: Secondary | ICD-10-CM

## 2022-07-19 DIAGNOSIS — F411 Generalized anxiety disorder: Secondary | ICD-10-CM | POA: Diagnosis not present

## 2022-07-19 MED ORDER — ESCITALOPRAM OXALATE 20 MG PO TABS: 20.0000 mg | ORAL_TABLET | Freq: Every day | ORAL | 3 refills | Status: DC

## 2022-07-19 MED ORDER — PANTOPRAZOLE SODIUM 40 MG PO TBEC: 40.0000 mg | DELAYED_RELEASE_TABLET | Freq: Every day | ORAL | 3 refills | Status: DC

## 2022-07-19 MED ORDER — CLONAZEPAM 0.5 MG PO TABS
ORAL_TABLET | ORAL | 5 refills | Status: DC
Start: 2022-07-22 — End: 2023-01-05

## 2022-07-19 NOTE — Progress Notes (Unsigned)
Erin Hale is a 67 y.o. female presents to office today for annual physical exam examination.    Concerns today include: 1. ***  Occupation: ***, Marital status: ***, Substance use: *** Diet: ***, Exercise: *** Last eye exam: *** Last dental exam: *** Last colonoscopy: *** Last mammogram: *** Last pap smear: *** Refills needed today: *** Immunizations needed:  There is no immunization history on file for this patient.   Past Medical History:  Diagnosis Date   Bronchial asthma    Depression    GERD (gastroesophageal reflux disease)    Hypothyroidism    Panic disorder    Thyroid disease    hypothyroidism   Social History   Socioeconomic History   Marital status: Divorced    Spouse name: Not on file   Number of children: Not on file   Years of education: Not on file   Highest education level: Not on file  Occupational History   Not on file  Tobacco Use   Smoking status: Former    Types: Cigarettes    Quit date: 03/15/1994    Years since quitting: 28.3   Smokeless tobacco: Never  Vaping Use   Vaping Use: Never used  Substance and Sexual Activity   Alcohol use: No   Drug use: No   Sexual activity: Not on file  Other Topics Concern   Not on file  Social History Narrative   Not on file   Social Determinants of Health   Financial Resource Strain: Not on file  Food Insecurity: Not on file  Transportation Needs: Not on file  Physical Activity: Not on file  Stress: Not on file  Social Connections: Not on file  Intimate Partner Violence: Not on file   Past Surgical History:  Procedure Laterality Date   ABDOMINAL HYSTERECTOMY     BIOPSY  01/22/2018   Procedure: BIOPSY;  Surgeon: West Bali, MD;  [facility];  Service: Endoscopy;;  (colon)   COLONOSCOPY WITH PROPOFOL N/A 01/22/2018   Procedure: COLONOSCOPY WITH PROPOFOL;  Surgeon: West Bali, MD;  [facility];  Service: Endoscopy;  Laterality: N/A;  1:30pm    ESOPHAGOGASTRODUODENOSCOPY (EGD) WITH PROPOFOL N/A 01/22/2018   Procedure: ESOPHAGOGASTRODUODENOSCOPY (EGD) WITH PROPOFOL;  Surgeon: West Bali, MD;  [facility];  Service: Endoscopy;  Laterality: N/A;   POLYPECTOMY  01/22/2018   Procedure: POLYPECTOMY;  Surgeon: West Bali, MD;  [facility];  Service: Endoscopy;;  (colon)   SAVORY DILATION N/A 01/22/2018   Procedure: SAVORY DILATION;  Surgeon: West Bali, MD;  [facility];  Service: Endoscopy;  Laterality: N/A;   Family History  Problem Relation Age of Onset   Stroke Mother    Congestive Heart Failure Mother    Diabetes Father    Cancer Father        brain   Colon cancer Neg Hx    Colon polyps Neg Hx     Current Outpatient Medications:    acetaminophen (TYLENOL) 650 MG CR tablet, Take 650 mg by mouth every 8 (eight) hours as needed for pain. , Disp: , Rfl:    albuterol (VENTOLIN HFA) 108 (90 Base) MCG/ACT inhaler, TAKE 2 PUFFS BY MOUTH EVERY 6 HOURS AS NEEDED FOR WHEEZE OR SHORTNESS OF BREATH, Disp: 18 each, Rfl: 0   Ascorbic Acid (VITAMIN C) 100 MG tablet, Take 100 mg by mouth daily., Disp: , Rfl:    azelastine (ASTELIN) 0.1 % nasal spray,  Place 1 spray into both nostrils 2 (two) times daily., Disp: 30 mL, Rfl: 12   clonazePAM (KLONOPIN) 0.5 MG tablet, TAKE 1 TABLET BY MOUTH TWICE DAILY AS NEEDED FOR ANXIETY (PANIC), Disp: 60 tablet, Rfl: 5   diphenhydrAMINE (BENADRYL) 25 mg capsule, Take 25 mg by mouth every 6 (six) hours as needed., Disp: , Rfl:    escitalopram (LEXAPRO) 20 MG tablet, Take 1 tablet (20 mg total) by mouth daily., Disp: 90 tablet, Rfl: 3   levothyroxine (SYNTHROID) 50 MCG tablet, Take 1 tablet (50 mcg total) by mouth daily., Disp: 90 tablet, Rfl: 0   Multiple Vitamins-Minerals (VITAMIN D3 COMPLETE PO), Take by mouth., Disp: , Rfl:    pantoprazole (PROTONIX) 40 MG tablet, TAKE 1 TABLET BY MOUTH ONCE DAILY 30 MINUTES BEFORE BREAKFAST, Disp: 90 tablet, Rfl: 1    simethicone (MYLICON) 125 MG chewable tablet, Chew 250 mg by mouth as needed for flatulence., Disp: , Rfl:   No Known Allergies   ROS: Review of Systems {ros; complete:30496}    Physical exam {Exam, Complete:[phone]}    Assessment/ Plan: Erin Hale here for annual physical exam.   No problem-specific Assessment & Plan notes found for this encounter.   Counseled on healthy lifestyle choices, including diet (rich in fruits, vegetables and lean meats and low in salt and simple carbohydrates) and exercise (at least 30 minutes of moderate physical activity daily).  Patient to follow up in 1 year for annual exam or sooner if needed.  Breyah Akhter M. Nadine Counts, DO

## 2022-07-20 ENCOUNTER — Other Ambulatory Visit: Payer: Self-pay | Admitting: Family Medicine

## 2022-07-20 DIAGNOSIS — J329 Chronic sinusitis, unspecified: Secondary | ICD-10-CM

## 2022-07-20 DIAGNOSIS — J410 Simple chronic bronchitis: Secondary | ICD-10-CM

## 2022-07-20 LAB — TSH: TSH: 6.37 u[IU]/mL — ABNORMAL HIGH (ref 0.450–4.500)

## 2022-07-20 LAB — T4, FREE: Free T4: 1.4 ng/dL (ref 0.82–1.77)

## 2022-07-20 MED ORDER — AZELASTINE HCL 0.1 % NA SOLN
1.0000 | Freq: Two times a day (BID) | NASAL | 5 refills | Status: AC
Start: 2022-07-20 — End: ?

## 2022-07-20 NOTE — Telephone Encounter (Signed)
  Prescription Request  07/20/2022  Is this a "Controlled Substance" medicine? azelastine (ASTELIN) 0.1 % nasal spray and albuterol (VENTOLIN HFA) 108 (90 Base) MCG/ACT inhaler   Have you seen your PCP in the last 2 weeks? yesterday  If YES, route message to pool  -  If NO, patient needs to be scheduled for appointment.  What is the name of the medication or equipment? azelastine (ASTELIN) 0.1 % nasal spray and albuterol (VENTOLIN HFA) 108 (90 Base) MCG/ACT inhaler   Have you contacted your pharmacy to request a refill? no   Which pharmacy would you like this sent to? [facility] in [place]   Patient notified that their request is being sent to the clinical staff for review and that they should receive a response within 2 business days.

## 2022-07-21 MED ORDER — ALBUTEROL SULFATE HFA 108 (90 BASE) MCG/ACT IN AERS
INHALATION_SPRAY | RESPIRATORY_TRACT | 0 refills | Status: DC
Start: 2022-07-21 — End: 2023-05-21

## 2022-07-21 NOTE — Telephone Encounter (Signed)
LMOVM refill sent to pharmacy 

## 2022-07-25 ENCOUNTER — Other Ambulatory Visit: Payer: Self-pay

## 2022-07-25 DIAGNOSIS — E039 Hypothyroidism, unspecified: Secondary | ICD-10-CM

## 2022-07-26 ENCOUNTER — Telehealth: Payer: Self-pay | Admitting: Family Medicine

## 2022-07-26 NOTE — Telephone Encounter (Signed)
Reviewed how to take Synthroid per pt result notes and pt voiced understanding.   TSH is slightly elevated but free T4 has normalized.  She may take 1-1/2 tablets Synthroid on Saturdays and continue 1 tablet daily all others. Repeat TSH and FT4 in 8 weeks. Please place orders and make lab appt.

## 2022-09-19 ENCOUNTER — Other Ambulatory Visit: Payer: Medicare Other

## 2022-09-20 ENCOUNTER — Other Ambulatory Visit: Payer: Self-pay | Admitting: Family Medicine

## 2022-09-20 DIAGNOSIS — E039 Hypothyroidism, unspecified: Secondary | ICD-10-CM

## 2022-10-05 ENCOUNTER — Other Ambulatory Visit: Payer: Medicare PPO

## 2022-12-20 ENCOUNTER — Encounter: Payer: Self-pay | Admitting: *Deleted

## 2022-12-23 ENCOUNTER — Other Ambulatory Visit: Payer: Self-pay | Admitting: Family Medicine

## 2022-12-23 DIAGNOSIS — E039 Hypothyroidism, unspecified: Secondary | ICD-10-CM

## 2023-01-04 ENCOUNTER — Other Ambulatory Visit: Payer: Self-pay | Admitting: Family Medicine

## 2023-01-04 DIAGNOSIS — E039 Hypothyroidism, unspecified: Secondary | ICD-10-CM

## 2023-01-05 ENCOUNTER — Other Ambulatory Visit: Payer: Self-pay | Admitting: Family Medicine

## 2023-01-05 ENCOUNTER — Ambulatory Visit (INDEPENDENT_AMBULATORY_CARE_PROVIDER_SITE_OTHER): Payer: Medicare PPO | Admitting: Family Medicine

## 2023-01-05 ENCOUNTER — Encounter: Payer: Self-pay | Admitting: Family Medicine

## 2023-01-05 VITALS — BP 128/79 | HR 94 | Temp 98.4°F | Ht 64.0 in | Wt 263.0 lb

## 2023-01-05 DIAGNOSIS — E039 Hypothyroidism, unspecified: Secondary | ICD-10-CM

## 2023-01-05 DIAGNOSIS — F5104 Psychophysiologic insomnia: Secondary | ICD-10-CM

## 2023-01-05 DIAGNOSIS — Z79899 Other long term (current) drug therapy: Secondary | ICD-10-CM

## 2023-01-05 DIAGNOSIS — F410 Panic disorder [episodic paroxysmal anxiety] without agoraphobia: Secondary | ICD-10-CM | POA: Diagnosis not present

## 2023-01-05 DIAGNOSIS — F3341 Major depressive disorder, recurrent, in partial remission: Secondary | ICD-10-CM

## 2023-01-05 DIAGNOSIS — F411 Generalized anxiety disorder: Secondary | ICD-10-CM

## 2023-01-05 DIAGNOSIS — Z23 Encounter for immunization: Secondary | ICD-10-CM

## 2023-01-05 MED ORDER — CLONAZEPAM 0.5 MG PO TABS
ORAL_TABLET | ORAL | 5 refills | Status: DC
Start: 2023-01-20 — End: 2023-07-31

## 2023-01-05 MED ORDER — RAMELTEON 8 MG PO TABS: 8.0000 mg | ORAL_TABLET | Freq: Every day | ORAL | 3 refills | Status: DC

## 2023-01-05 NOTE — Progress Notes (Signed)
Subjective: CC: med refills PCP: Raliegh Ip, DO UJW:JXBJY Erin Hale is a 67 y.o. female presenting to clinic today for:  1.  Hypothyroidism Compliant with Synthroid.  No reports of tremor, heart palpitations but she does report difficulty with sleep.  She notes melatonin causes her eyes to hurt.  She is been utilizing Benadryl but worries about effect on her bladder  2.  Generalized anxiety disorder associated with depression Patient is compliant with Lexapro.  Taking Klonopin twice daily as needed.  She denies any excessive daytime sedation, falls, respiratory depression or visual auditory hallucinations.  She reports evident flow of anxiety but is working through things for the most part.   ROS: Per HPI  No Known Allergies Past Medical History:  Diagnosis Date   Bronchial asthma    Depression    GERD (gastroesophageal reflux disease)    Hypothyroidism    Panic disorder    Thyroid disease    hypothyroidism    Current Outpatient Medications:    acetaminophen (TYLENOL) 650 MG CR tablet, Take 650 mg by mouth every 8 (eight) hours as needed for pain. , Disp: , Rfl:    albuterol (VENTOLIN HFA) 108 (90 Base) MCG/ACT inhaler, TAKE 2 PUFFS BY MOUTH EVERY 6 HOURS AS NEEDED FOR WHEEZE OR SHORTNESS OF BREATH, Disp: 18 each, Rfl: 0   Ascorbic Acid (VITAMIN C) 100 MG tablet, Take 100 mg by mouth daily., Disp: , Rfl:    azelastine (ASTELIN) 0.1 % nasal spray, Place 1 spray into both nostrils 2 (two) times daily., Disp: 30 mL, Rfl: 5   diphenhydrAMINE (BENADRYL) 25 mg capsule, Take 25 mg by mouth every 6 (six) hours as needed., Disp: , Rfl:    escitalopram (LEXAPRO) 20 MG tablet, Take 1 tablet (20 mg total) by mouth daily., Disp: 90 tablet, Rfl: 3   fenoprofen (NALFON) 600 MG TABS tablet, Take 600 mg by mouth 2 (two) times daily., Disp: , Rfl:    levothyroxine (SYNTHROID) 50 MCG tablet, Take 1 tablet (50 mcg total) by mouth daily., Disp: 30 tablet, Rfl: 0   Multiple Vitamins-Minerals  (VITAMIN D3 COMPLETE PO), Take by mouth., Disp: , Rfl:    pantoprazole (PROTONIX) 40 MG tablet, Take 1 tablet (40 mg total) by mouth daily., Disp: 90 tablet, Rfl: 3   simethicone (MYLICON) 125 MG chewable tablet, Chew 250 mg by mouth as needed for flatulence., Disp: , Rfl:    [START ON 01/20/2023] clonazePAM (KLONOPIN) 0.5 MG tablet, TAKE 1 TABLET BY MOUTH TWICE DAILY AS NEEDED FOR ANXIETY (PANIC), Disp: 60 tablet, Rfl: 5 Social History   Socioeconomic History   Marital status: Divorced    Spouse name: Not on file   Number of children: Not on file   Years of education: Not on file   Highest education level: Not on file  Occupational History   Not on file  Tobacco Use   Smoking status: Former    Current packs/day: 0.00    Types: Cigarettes    Quit date: 03/15/1994    Years since quitting: 28.8   Smokeless tobacco: Never  Vaping Use   Vaping status: Never Used  Substance and Sexual Activity   Alcohol use: No   Drug use: No   Sexual activity: Not on file  Other Topics Concern   Not on file  Social History Narrative   Not on file   Social Determinants of Health   Financial Resource Strain: Not on file  Food Insecurity: Not on file  Transportation  Needs: Not on file  Physical Activity: Not on file  Stress: Not on file  Social Connections: Not on file  Intimate Partner Violence: Not on file   Family History  Problem Relation Age of Onset   Stroke Mother    Congestive Heart Failure Mother    Diabetes Father    Cancer Father        brain   Colon cancer Neg Hx    Colon polyps Neg Hx     Objective: Office vital signs reviewed. BP 128/79   Pulse 94   Temp 98.4 F (36.9 C)   Ht 5\' 4"  (1.626 m)   Wt 263 lb (119.3 kg)   LMP  (LMP Unknown)   SpO2 92%   BMI 45.14 kg/m   Physical Examination:  General: Awake, alert, well nourished, obese. No acute distress HEENT: sclera white, MMM.  No exophthalmos Cardio: regular rate and rhythm, S1S2 heard, no murmurs  appreciated Pulm: clear to auscultation bilaterally, no wheezes, rhonchi or rales; normal work of breathing on room air Neuro: no tremor     01/05/2023    2:06 PM 01/05/2023    1:59 PM 01/18/2022    3:45 PM  Depression screen PHQ 2/9  Decreased Interest 1 1 2   Down, Depressed, Hopeless 1 1 2   PHQ - 2 Score 2 2 4   Altered sleeping 2 2 2   Tired, decreased energy 2 2 2   Change in appetite 2 1 2   Feeling bad or failure about yourself  1 1 2   Trouble concentrating 0 0 1  Moving slowly or fidgety/restless 0 0 1  Suicidal thoughts 0 0 0  PHQ-9 Score 9 8 14   Difficult doing work/chores Somewhat difficult Somewhat difficult Not difficult at all      01/05/2023    2:06 PM 01/18/2022    3:45 PM 07/08/2021    3:35 PM 12/28/2020    2:59 PM  GAD 7 : Generalized Anxiety Score  Nervous, Anxious, on Edge 1 1 1 2   Control/stop worrying 1 2 2 3   Worry too much - different things 1 2 2 3   Trouble relaxing 1 1 1 1   Restless 0 0 0 0  Easily annoyed or irritable 1 1 1 1   Afraid - awful might happen 2 2 2 2   Total GAD 7 Score 7 9 9 12   Anxiety Difficulty  Somewhat difficult Somewhat difficult Somewhat difficult       01/05/2023    2:10 PM 07/19/2022    3:30 PM  MMSE - Mini Mental State Exam  Orientation to time 4 4  Orientation to Place 4 5  Registration 3 2  Attention/ Calculation 5 5  Recall 3 3  Language- name 2 objects 2 2  Language- repeat 1 1  Language- follow 3 step command 3 3  Language- read & follow direction 1 1  Write a sentence 1 1  Copy design 1 1  Total score 28 28     Assessment/ Plan: 67 y.o. female   Acquired hypothyroidism - Plan: TSH, T4, Free  Recurrent major depressive disorder, in partial remission (HCC) - Plan: clonazePAM (KLONOPIN) 0.5 MG tablet  Generalized anxiety disorder with panic attacks - Plan: ToxASSURE Select 13 (MW), Urine, clonazePAM (KLONOPIN) 0.5 MG tablet  Severe obesity (BMI >= 40) (HCC) - Plan: CMP14+EGFR, Lipid  Panel  Psychophysiological insomnia  Controlled substance agreement signed - Plan: ToxASSURE Select 13 (MW), Urine  Check thyroid levels.  Having a little breakthrough anxiety and  difficulty with sleep.  Plan for Rozarem.  If not covered by insurance could consider trazodone at low-dose but need to be careful given use of SSRI.  Not a candidate for TCA again given use of SSRI.  I do not think Ambien is appropriate given use of benzodiazepine chronically.  May need to send to mail order pharmacy for cost effectiveness.  Appears that it might be about $28 for 79-month supply through them  Klonopin renewed.  UDS and CSA were updated as per office policy.  The national narcotic database reviewed and there were no red flags  Fasting labs collected today   Raliegh Ip, DO [facility] [phone]

## 2023-01-05 NOTE — Telephone Encounter (Signed)
  traZODone (DESYREL) 50 MG tablet    Pharmacy comment: Alternative Requested:THE PRESCRIBED MEDICATION IS NOT COVERED BY INSURANCE. PLEASE CONSIDER CHANGING TO ONE OF THE SUGGESTED COVERED ALTERNATIVES.   All Pharmacy Suggested Alternatives:  traZODone (DESYREL) 50 MG tablet Suvorexant (BELSOMRA) 5 MG TABS zolpidem (AMBIEN) 5 MG tablet

## 2023-01-06 LAB — CMP14+EGFR
ALT: 21 [IU]/L (ref 0–32)
AST: 20 [IU]/L (ref 0–40)
Albumin: 4.3 g/dL (ref 3.9–4.9)
Alkaline Phosphatase: 119 [IU]/L (ref 44–121)
BUN/Creatinine Ratio: 15 (ref 12–28)
BUN: 15 mg/dL (ref 8–27)
Bilirubin Total: 0.3 mg/dL (ref 0.0–1.2)
CO2: 22 mmol/L (ref 20–29)
Calcium: 9.1 mg/dL (ref 8.7–10.3)
Chloride: 106 mmol/L (ref 96–106)
Creatinine, Ser: 1.01 mg/dL — ABNORMAL HIGH (ref 0.57–1.00)
Globulin, Total: 2.3 g/dL (ref 1.5–4.5)
Glucose: 102 mg/dL — ABNORMAL HIGH (ref 70–99)
Potassium: 3.8 mmol/L (ref 3.5–5.2)
Sodium: 145 mmol/L — ABNORMAL HIGH (ref 134–144)
Total Protein: 6.6 g/dL (ref 6.0–8.5)
eGFR: 61 mL/min/{1.73_m2} (ref >59)

## 2023-01-06 LAB — LIPID PANEL
Chol/HDL Ratio: 3.3 ratio (ref 0.0–4.4)
Cholesterol, Total: 208 mg/dL — ABNORMAL HIGH (ref 100–199)
HDL: 63 mg/dL (ref >39)
LDL Chol Calc (NIH): 127 mg/dL — ABNORMAL HIGH (ref 0–99)
Triglycerides: 100 mg/dL (ref 0–149)
VLDL Cholesterol Cal: 18 mg/dL (ref 5–40)

## 2023-01-06 LAB — TSH: TSH: 5.56 u[IU]/mL — ABNORMAL HIGH (ref 0.450–4.500)

## 2023-01-06 LAB — T4, FREE: Free T4: 1.51 ng/dL (ref 0.82–1.77)

## 2023-01-08 ENCOUNTER — Telehealth: Payer: Self-pay | Admitting: Family Medicine

## 2023-01-08 NOTE — Telephone Encounter (Signed)
Trazodone is 1 that we commonly use with other antidepressants and has minimal risk of an interaction especially at a lower dose but if she would like to discuss other options then please make an appointment with PCP

## 2023-01-09 LAB — TOXASSURE SELECT 13 (MW), URINE

## 2023-01-09 NOTE — Telephone Encounter (Signed)
Pt made aware and understood. She declined an appt at this time. She will take the Trazodone.

## 2023-01-12 ENCOUNTER — Telehealth: Payer: Self-pay | Admitting: Family Medicine

## 2023-01-12 NOTE — Telephone Encounter (Signed)
Spoke with patient regarding this. Patient says she has not requested to have a [employer] test done. Please disregard this fax once received. Possible fraud call from this company.  Copied from CRM [id number]. Topic: General - Phone/Fax/Address >> Jan 12, 2023  1:37 PM Danika B wrote: [employer] wanted to confirm we received the fax. [facility] stated they did not receive it. Confirmed fax number with [employer]. York Spaniel he will try again and follow up with [place] on Monday.

## 2023-01-16 ENCOUNTER — Telehealth: Payer: Self-pay | Admitting: Family Medicine

## 2023-01-16 NOTE — Telephone Encounter (Unsigned)
Copied from CRM [id number]. Topic: Clinical - Request for Lab/Test Order >> Jan 16, 2023  2:41 PM Herbert Seta B wrote: Reason for CRM: Derick from [employer] calling to see if fax received, needs to go over labs ordered with PCP.

## 2023-01-17 NOTE — Telephone Encounter (Signed)
Per Clinical manager we do not deal with this company.

## 2023-01-18 ENCOUNTER — Telehealth: Payer: Self-pay | Admitting: Family Medicine

## 2023-01-18 NOTE — Telephone Encounter (Signed)
Have you seen results?  °

## 2023-01-18 NOTE — Telephone Encounter (Signed)
Copied from CRM [id number]. Topic: Clinical - Lab/Test Results >> Jan 18, 2023  1:11 PM Deaijah H wrote: Reason for CRM: Derick [employer] calling to see if fax was received and to go over lab results / Derick callback # [phone]

## 2023-01-19 ENCOUNTER — Other Ambulatory Visit: Payer: Self-pay | Admitting: Family Medicine

## 2023-01-19 DIAGNOSIS — F410 Panic disorder [episodic paroxysmal anxiety] without agoraphobia: Secondary | ICD-10-CM

## 2023-01-19 DIAGNOSIS — F3341 Major depressive disorder, recurrent, in partial remission: Secondary | ICD-10-CM

## 2023-01-22 ENCOUNTER — Telehealth: Payer: Self-pay | Admitting: Family Medicine

## 2023-01-23 NOTE — Telephone Encounter (Signed)
I see several notes re: if we received fax but I see no actual faxes with information received.  Also, please contact the patient to make sure this is something she requested.  Genetic testing is typically carried out by geneticists so I'm not entirely sure why this request is being sent to me?

## 2023-01-27 ENCOUNTER — Other Ambulatory Visit: Payer: Self-pay | Admitting: Family Medicine

## 2023-01-27 DIAGNOSIS — E039 Hypothyroidism, unspecified: Secondary | ICD-10-CM

## 2023-02-06 ENCOUNTER — Telehealth: Payer: Self-pay | Admitting: Family Medicine

## 2023-02-06 NOTE — Telephone Encounter (Signed)
Please see previous notes, I've seen nothing come by.

## 2023-02-06 NOTE — Telephone Encounter (Signed)
Copied from CRM [id number]. Topic: Clinical - Medical Advice >> Feb 06, 2023  1:50 PM Desma Mcgregor wrote: Reason for CRM: Derrick from [employer], calling back stating that a fax was sent over 01/15/2023 looking for a medical decision from Dr. Nadine Counts. The paperwork, if at all received, is requesting the decision to see if the testing would be beneficial for the patient or not. Derrick expressed he is trying to follow CMS guidelines as they advised this has to be sent to the patient's PCP. Please f/u at [phone].

## 2023-02-07 NOTE — Telephone Encounter (Signed)
Informed [employer] that our providers do not authorize these genetic testing unless that patient contacts [place] and gives [place] consent.

## 2023-05-20 ENCOUNTER — Other Ambulatory Visit: Payer: Self-pay | Admitting: Family Medicine

## 2023-05-20 DIAGNOSIS — J410 Simple chronic bronchitis: Secondary | ICD-10-CM

## 2023-07-16 ENCOUNTER — Other Ambulatory Visit: Payer: Self-pay | Admitting: Family Medicine

## 2023-07-16 DIAGNOSIS — F410 Panic disorder [episodic paroxysmal anxiety] without agoraphobia: Secondary | ICD-10-CM

## 2023-07-16 DIAGNOSIS — F3341 Major depressive disorder, recurrent, in partial remission: Secondary | ICD-10-CM

## 2023-07-16 NOTE — Telephone Encounter (Unsigned)
 Copied from CRM [id number]. Topic: Clinical - Medication Refill >> Jul 16, 2023  5:39 PM Felizardo Hotter wrote: Medication: clonazePAM  (KLONOPIN ) 0.5 MG tablet  Has the patient contacted their pharmacy? Yes (Agent: If no, request that the patient contact the pharmacy for the refill. If patient does not wish to contact the pharmacy document the reason why and proceed with request.) (Agent: If yes, when and what did the pharmacy advise?)  This is the patient's preferred pharmacy:  [id number] - [place], [place] - [address] [place] [address] Phone: [phone] Fax: [phone]  Is this the correct pharmacy for this prescription? Yes If no, delete pharmacy and type the correct one.   Has the prescription been filled recently? Yes  Is the patient out of the medication? Yes  Has the patient been seen for an appointment in the last year OR does the patient have an upcoming appointment? Yes  Can we respond through MyChart? Yes  Agent: Please be advised that Rx refills may take up to 3 business days. We ask that you follow-up with your pharmacy.

## 2023-07-17 ENCOUNTER — Other Ambulatory Visit: Payer: Self-pay | Admitting: Family Medicine

## 2023-07-17 DIAGNOSIS — F3341 Major depressive disorder, recurrent, in partial remission: Secondary | ICD-10-CM

## 2023-07-19 ENCOUNTER — Telehealth: Payer: Self-pay

## 2023-07-19 ENCOUNTER — Other Ambulatory Visit: Payer: Self-pay | Admitting: Family Medicine

## 2023-07-19 DIAGNOSIS — F410 Panic disorder [episodic paroxysmal anxiety] without agoraphobia: Secondary | ICD-10-CM

## 2023-07-19 DIAGNOSIS — F3341 Major depressive disorder, recurrent, in partial remission: Secondary | ICD-10-CM

## 2023-07-19 NOTE — Telephone Encounter (Signed)
 Copied from CRM [id number]. Topic: General - Other >> Jul 19, 2023  5:18 PM Adrianna P wrote: Reason for CRM: Patient is due to run out of clonazePAM  (KLONOPIN ) 0.5 MG tablet on 5/16 but her appt is not until 5/23

## 2023-07-20 ENCOUNTER — Encounter: Payer: Medicare Other | Admitting: Family Medicine

## 2023-07-20 MED ORDER — CLONAZEPAM 0.5 MG PO TABS: 0.5000 mg | ORAL_TABLET | Freq: Two times a day (BID) | ORAL | 0 refills | Status: DC | PRN

## 2023-07-20 NOTE — Addendum Note (Signed)
 Addended by: Jolyne Needs on: 07/20/2023 10:25 AM   Modules accepted: Orders

## 2023-07-20 NOTE — Telephone Encounter (Signed)
 Sent 1 week of clonazepam  for the patient.

## 2023-07-20 NOTE — Telephone Encounter (Signed)
 Pt was originally scheduled to see Dr Crissie Dome today but was rescheduled due to provider out. Can you do a bridge to get her through to her appt 5/23?

## 2023-07-20 NOTE — Telephone Encounter (Signed)
 Pt aware and voiced understanding and aware to make sure she comes in for her appt with Dr Crissie Dome next week.

## 2023-07-27 ENCOUNTER — Ambulatory Visit: Admitting: Family Medicine

## 2023-07-27 ENCOUNTER — Other Ambulatory Visit: Payer: Self-pay | Admitting: Family Medicine

## 2023-07-27 DIAGNOSIS — F3341 Major depressive disorder, recurrent, in partial remission: Secondary | ICD-10-CM

## 2023-07-27 DIAGNOSIS — F410 Panic disorder [episodic paroxysmal anxiety] without agoraphobia: Secondary | ICD-10-CM

## 2023-07-27 NOTE — Progress Notes (Deleted)
 Subjective: CC:*** PCP: Eliodoro Guerin, DO QVZ:DGLOV Erin Hale is a 68 y.o. female presenting to clinic today for:  1. ***   ROS: Per HPI  No Known Allergies Past Medical History:  Diagnosis Date   Bronchial asthma    Depression    GERD (gastroesophageal reflux disease)    Hypothyroidism    Panic disorder    Thyroid  disease    hypothyroidism    Current Outpatient Medications:    acetaminophen  (TYLENOL ) 650 MG CR tablet, Take 650 mg by mouth every 8 (eight) hours as needed for pain. , Disp: , Rfl:    albuterol  (VENTOLIN  HFA) 108 (90 Base) MCG/ACT inhaler, INHALE 2 PUFFS INTO THE LUNGS EVERY 6 HOURS AS NEEDED FOR WHEEZE OR SHORTNESS OF BREATH, Disp: 18 each, Rfl: 0   Ascorbic Acid (VITAMIN C) 100 MG tablet, Take 100 mg by mouth daily., Disp: , Rfl:    azelastine  (ASTELIN ) 0.1 % nasal spray, Place 1 spray into both nostrils 2 (two) times daily., Disp: 30 mL, Rfl: 5   clonazePAM  (KLONOPIN ) 0.5 MG tablet, TAKE 1 TABLET BY MOUTH TWICE DAILY AS NEEDED FOR ANXIETY (PANIC), Disp: 60 tablet, Rfl: 5   clonazePAM  (KLONOPIN ) 0.5 MG tablet, Take 1 tablet (0.5 mg total) by mouth 2 (two) times daily as needed for anxiety., Disp: 14 tablet, Rfl: 0   diphenhydrAMINE (BENADRYL) 25 mg capsule, Take 25 mg by mouth every 6 (six) hours as needed., Disp: , Rfl:    escitalopram  (LEXAPRO ) 20 MG tablet, TAKE 1 TABLET BY MOUTH EVERY DAY, Disp: 90 tablet, Rfl: 0   fenoprofen (NALFON) 600 MG TABS tablet, Take 600 mg by mouth 2 (two) times daily., Disp: , Rfl:    levothyroxine  (SYNTHROID ) 50 MCG tablet, TAKE 1 TABLET BY MOUTH EVERY DAY, Disp: 90 tablet, Rfl: 1   Multiple Vitamins-Minerals (VITAMIN D3 COMPLETE PO), Take by mouth., Disp: , Rfl:    pantoprazole  (PROTONIX ) 40 MG tablet, Take 1 tablet (40 mg total) by mouth daily., Disp: 90 tablet, Rfl: 3   simethicone  (MYLICON) 125 MG chewable tablet, Chew 250 mg by mouth as needed for flatulence., Disp: , Rfl:    traZODone (DESYREL) 50 MG tablet, Take  0.5-1 tablets (25-50 mg total) by mouth at bedtime as needed for sleep., Disp: 90 tablet, Rfl: 3 Social History   Socioeconomic History   Marital status: Divorced    Spouse name: Not on file   Number of children: Not on file   Years of education: Not on file   Highest education level: Not on file  Occupational History   Not on file  Tobacco Use   Smoking status: Former    Current packs/day: 0.00    Types: Cigarettes    Quit date: 03/15/1994    Years since quitting: 29.3   Smokeless tobacco: Never  Vaping Use   Vaping status: Never Used  Substance and Sexual Activity   Alcohol use: No   Drug use: No   Sexual activity: Not on file  Other Topics Concern   Not on file  Social History Narrative   Not on file   Social Drivers of Health   Financial Resource Strain: Not on file  Food Insecurity: Not on file  Transportation Needs: Not on file  Physical Activity: Not on file  Stress: Not on file  Social Connections: Not on file  Intimate Partner Violence: Not on file   Family History  Problem Relation Age of Onset   Stroke Mother  Congestive Heart Failure Mother    Diabetes Father    Cancer Father        brain   Colon cancer Neg Hx    Colon polyps Neg Hx     Objective: Office vital signs reviewed. LMP  (LMP Unknown)   Physical Examination:  General: Awake, alert, *** nourished, No acute distress HEENT: Normal    Neck: No masses palpated. No lymphadenopathy    Ears: Tympanic membranes intact, normal light reflex, no erythema, no bulging    Eyes: PERRLA, extraocular membranes intact, sclera ***    Nose: nasal turbinates moist, *** nasal discharge    Throat: moist mucus membranes, no erythema, *** tonsillar exudate.  Airway is patent Cardio: regular rate and rhythm, S1S2 heard, no murmurs appreciated Pulm: clear to auscultation bilaterally, no wheezes, rhonchi or rales; normal work of breathing on room air GI: soft, non-tender, non-distended, bowel sounds present  x4, no hepatomegaly, no splenomegaly, no masses GU: external vaginal tissue ***, cervix ***, *** punctate lesions on cervix appreciated, *** discharge from cervical os, *** bleeding, *** cervical motion tenderness, *** abdominal/ adnexal masses Extremities: warm, well perfused, No edema, cyanosis or clubbing; +*** pulses bilaterally MSK: *** gait and *** station Skin: dry; intact; no rashes or lesions Neuro: *** Strength and light touch sensation grossly intact, *** DTRs ***/4  Assessment/ Plan: 68 y.o. female   Recurrent major depressive disorder, in partial remission (HCC)  Generalized anxiety disorder with panic attacks  Psychophysiological insomnia  Acquired hypothyroidism  ***   Jelani Trueba Bambi Bonine, DO [facility] [phone]

## 2023-07-27 NOTE — Telephone Encounter (Signed)
 Copied from CRM [id number]. Topic: Clinical - Prescription Issue >> Jul 27, 2023  5:36 PM Tiffany H wrote: Reason for CRM: ***

## 2023-07-30 ENCOUNTER — Other Ambulatory Visit: Payer: Self-pay | Admitting: Family Medicine

## 2023-07-30 DIAGNOSIS — E039 Hypothyroidism, unspecified: Secondary | ICD-10-CM

## 2023-07-31 ENCOUNTER — Ambulatory Visit: Admitting: Family Medicine

## 2023-07-31 ENCOUNTER — Telehealth: Payer: Self-pay | Admitting: Family Medicine

## 2023-07-31 ENCOUNTER — Other Ambulatory Visit: Payer: Self-pay | Admitting: Family Medicine

## 2023-07-31 DIAGNOSIS — F3341 Major depressive disorder, recurrent, in partial remission: Secondary | ICD-10-CM

## 2023-07-31 DIAGNOSIS — F410 Panic disorder [episodic paroxysmal anxiety] without agoraphobia: Secondary | ICD-10-CM

## 2023-07-31 MED ORDER — CLONAZEPAM 0.5 MG PO TABS: ORAL_TABLET | ORAL | 0 refills | Status: DC

## 2023-07-31 NOTE — Telephone Encounter (Signed)
 Done. Looks like she was one of the pts we moved while I was out so a 30d supply sent. Make sure she keeps June appt.

## 2023-07-31 NOTE — Telephone Encounter (Signed)
 Left detailed message for pt to cb if she has any questions

## 2023-07-31 NOTE — Telephone Encounter (Signed)
 Controlled substance-has appt 6/9 with PCP, can yall get pt in sooner or bridge?

## 2023-07-31 NOTE — Telephone Encounter (Signed)
 Copied from CRM [id number]. Topic: Clinical - Prescription Issue >> Jul 27, 2023  5:36 PM Tiffany H wrote: Reason for CRM: Patient called to request refill of Klonopin . Current prescription is pended. Patient is on her last dosage today. Please have authorized and sent to pharmacy, if possible. >> Jul 27, 2023  5:39 PM Tiffany H wrote: Patient called to request refill of Klonopin . Current prescription is pended. Patient is on her last dosage today. Please have authorized and sent to pharmacy, if possible.

## 2023-08-02 ENCOUNTER — Encounter: Payer: Self-pay | Admitting: Family Medicine

## 2023-08-13 ENCOUNTER — Ambulatory Visit: Admitting: Family Medicine

## 2023-08-13 ENCOUNTER — Encounter: Payer: Self-pay | Admitting: Family Medicine

## 2023-08-13 VITALS — BP 108/78 | HR 98 | Temp 98.2°F | Ht 64.0 in | Wt 264.2 lb

## 2023-08-13 DIAGNOSIS — J301 Allergic rhinitis due to pollen: Secondary | ICD-10-CM

## 2023-08-13 DIAGNOSIS — F411 Generalized anxiety disorder: Secondary | ICD-10-CM

## 2023-08-13 DIAGNOSIS — E039 Hypothyroidism, unspecified: Secondary | ICD-10-CM | POA: Diagnosis not present

## 2023-08-13 DIAGNOSIS — F410 Panic disorder [episodic paroxysmal anxiety] without agoraphobia: Secondary | ICD-10-CM

## 2023-08-13 DIAGNOSIS — F3341 Major depressive disorder, recurrent, in partial remission: Secondary | ICD-10-CM | POA: Diagnosis not present

## 2023-08-13 DIAGNOSIS — R5383 Other fatigue: Secondary | ICD-10-CM | POA: Diagnosis not present

## 2023-08-13 DIAGNOSIS — G479 Sleep disorder, unspecified: Secondary | ICD-10-CM | POA: Diagnosis not present

## 2023-08-13 MED ORDER — CLONAZEPAM 0.5 MG PO TABS
ORAL_TABLET | ORAL | 5 refills | Status: DC
Start: 2023-08-30 — End: 2023-12-25

## 2023-08-13 MED ORDER — DESLORATADINE 5 MG PO TABS: 5.0000 mg | ORAL_TABLET | Freq: Every day | ORAL | 3 refills | Status: AC

## 2023-08-13 NOTE — Progress Notes (Signed)
 Subjective: CC: Follow-up anxiety PCP: Eliodoro Guerin, DO Erin Hale is a 68 y.o. female presenting to clinic today for:  1.  Generalized anxiety disorder with panic attack, sleep difficulty, allergies She reports compliance with Lexapro  20 mg daily.  Uses Klonopin  on a fairly regular basis.  No memory changes, falls or respiratory depression  She does report low energy however.  Denies any rectal bleeding, no unplanned weight loss.  No breast concerns.  Sister has a personal history of breast cancer.  She is not up-to-date on her mammogram.  She admits to some interrupted sleep at bedtime where she has frequent nighttime awakenings.  She does not know if she pauses breathing but she feels like she breathes okay.  No admits to snoring.  She reports some nasal drainage that is not relieved by many OTC allergy meds but does seem to be somewhat better with Benadryl use.   ROS: Per HPI  No Known Allergies Past Medical History:  Diagnosis Date   Bronchial asthma    Depression    GERD (gastroesophageal reflux disease)    Hypothyroidism    Panic disorder    Thyroid  disease    hypothyroidism    Current Outpatient Medications:    acetaminophen  (TYLENOL ) 650 MG CR tablet, Take 650 mg by mouth every 8 (eight) hours as needed for pain. , Disp: , Rfl:    albuterol  (VENTOLIN  HFA) 108 (90 Base) MCG/ACT inhaler, INHALE 2 PUFFS INTO THE LUNGS EVERY 6 HOURS AS NEEDED FOR WHEEZE OR SHORTNESS OF BREATH, Disp: 18 each, Rfl: 0   Ascorbic Acid (VITAMIN C) 100 MG tablet, Take 100 mg by mouth daily., Disp: , Rfl:    azelastine  (ASTELIN ) 0.1 % nasal spray, Place 1 spray into both nostrils 2 (two) times daily., Disp: 30 mL, Rfl: 5   diphenhydrAMINE (BENADRYL) 25 mg capsule, Take 25 mg by mouth every 6 (six) hours as needed., Disp: , Rfl:    escitalopram  (LEXAPRO ) 20 MG tablet, TAKE 1 TABLET BY MOUTH EVERY DAY, Disp: 90 tablet, Rfl: 0   fenoprofen (NALFON) 600 MG TABS tablet, Take 600 mg by  mouth 2 (two) times daily., Disp: , Rfl:    levothyroxine  (SYNTHROID ) 50 MCG tablet, TAKE 1 TABLET BY MOUTH EVERY DAY, Disp: 90 tablet, Rfl: 1   Multiple Vitamins-Minerals (VITAMIN D3 COMPLETE PO), Take by mouth., Disp: , Rfl:    pantoprazole  (PROTONIX ) 40 MG tablet, Take 1 tablet (40 mg total) by mouth daily., Disp: 90 tablet, Rfl: 3   simethicone  (MYLICON) 125 MG chewable tablet, Chew 250 mg by mouth as needed for flatulence., Disp: , Rfl:    traZODone (DESYREL) 50 MG tablet, Take 0.5-1 tablets (25-50 mg total) by mouth at bedtime as needed for sleep., Disp: 90 tablet, Rfl: 3   [START ON 08/30/2023] clonazePAM  (KLONOPIN ) 0.5 MG tablet, TAKE 1 TABLET BY MOUTH TWICE DAILY AS NEEDED FOR ANXIETY (PANIC), Disp: 60 tablet, Rfl: 5 Social History   Socioeconomic History   Marital status: Divorced    Spouse name: Not on file   Number of children: Not on file   Years of education: Not on file   Highest education level: Not on file  Occupational History   Not on file  Tobacco Use   Smoking status: Former    Current packs/day: 0.00    Types: Cigarettes    Quit date: 03/15/1994    Years since quitting: 29.4   Smokeless tobacco: Never  Vaping Use   Vaping status: Never  Used  Substance and Sexual Activity   Alcohol use: No   Drug use: No   Sexual activity: Not on file  Other Topics Concern   Not on file  Social History Narrative   Not on file   Social Drivers of Health   Financial Resource Strain: Not on file  Food Insecurity: Not on file  Transportation Needs: Not on file  Physical Activity: Not on file  Stress: Not on file  Social Connections: Not on file  Intimate Partner Violence: Not on file   Family History  Problem Relation Age of Onset   Stroke Mother    Congestive Heart Failure Mother    Diabetes Father    Cancer Father        brain   Colon cancer Neg Hx    Colon polyps Neg Hx     Objective: Office vital signs reviewed. BP 108/78   Pulse 98   Temp 98.2 F (36.8  C)   Ht 5\' 4"  (1.626 m)   Wt 264 lb 3.2 oz (119.8 kg)   LMP  (LMP Unknown)   SpO2 95%   BMI 45.35 kg/m   Physical Examination:  General: Awake, alert, morbidly obese, No acute distress HEENT: Sclera white.  No exophthalmos Cardio: regular rate and rhythm, S1S2 heard, no murmurs appreciated Pulm: clear to auscultation bilaterally, no wheezes, rhonchi or rales; normal work of breathing on room air MSK: Wide-based gait.  Ambulating independently.     08/13/2023    8:33 AM 01/05/2023    2:06 PM 01/05/2023    1:59 PM  Depression screen PHQ 2/9  Decreased Interest 2 1 1   Down, Depressed, Hopeless 1 1 1   PHQ - 2 Score 3 2 2   Altered sleeping 2 2 2   Tired, decreased energy 3 2 2   Change in appetite 1 2 1   Feeling bad or failure about yourself  0 1 1  Trouble concentrating 1 0 0  Moving slowly or fidgety/restless 1 0 0  Suicidal thoughts 0 0 0  PHQ-9 Score 11 9 8   Difficult doing work/chores Somewhat difficult Somewhat difficult Somewhat difficult      08/13/2023    8:33 AM 01/05/2023    2:06 PM 01/18/2022    3:45 PM 07/08/2021    3:35 PM  GAD 7 : Generalized Anxiety Score  Nervous, Anxious, on Edge 1 1 1 1   Control/stop worrying 2 1 2 2   Worry too much - different things 2 1 2 2   Trouble relaxing 1 1 1 1   Restless 0 0 0 0  Easily annoyed or irritable 1 1 1 1   Afraid - awful might happen 2 2 2 2   Total GAD 7 Score 9 7 9 9   Anxiety Difficulty Somewhat difficult  Somewhat difficult Somewhat difficult    Assessment/ Plan: 68 y.o. female   Low energy - Plan: CBC, VITAMIN D 25 Hydroxy (Vit-D Deficiency, Fractures), Vitamin B12  Acquired hypothyroidism - Plan: TSH + free T4  Severe obesity (BMI >= 40) (HCC) - Plan: VITAMIN D 25 Hydroxy (Vit-D Deficiency, Fractures)  Sleeping difficulties  Non-seasonal allergic rhinitis due to pollen - Plan: desloratadine (CLARINEX) 5 MG tablet  Recurrent major depressive disorder, in partial remission (HCC) - Plan: clonazePAM  (KLONOPIN ) 0.5  MG tablet  Generalized anxiety disorder with panic attacks - Plan: clonazePAM  (KLONOPIN ) 0.5 MG tablet  Will look for metabolic causes of low energy.  However, we discussed that if labs are unremarkable we may need to strongly consider home  sleep study.  She has macromastia in addition to morbid obesity so she certainly fits the picture for an obstructive sleep apnea that may have gone unidentified thus far.  She was amenable to home sleep study    Check thyroid  levels.  Clarinex ordered for allergy symptoms.  Okay to continue Astelin  if needed  Klonopin  renewed.  UDS and CSA are up-to-date.  National narcotic database reviewed and there were no red flags.  Plan for MMSE at physical in 6 months   Elba Dendinger Bambi Bonine, DO [facility] [phone]

## 2023-08-13 NOTE — Patient Instructions (Signed)
 Home sleep study ordered.  They will call you with instructions to pick up equipment to use at home.  Sleep Apnea  Sleep apnea is a condition that affects your breathing while you are sleeping. Your tongue or soft tissue in your throat may block the flow of air while you sleep. You may have shallow breathing or stop breathing for short periods of time. People with sleep apnea may snore loudly. There are three kinds of sleep apnea: Obstructive sleep apnea. This kind is caused by a blocked or collapsed airway. This is the most common. Central sleep apnea. This kind happens when the part of the brain that controls breathing does not send the correct signals to the muscles that control breathing. Mixed sleep apnea. This is a combination of obstructive and central sleep apnea. What are the causes? The most common cause of sleep apnea is a collapsed or blocked airway. What increases the risk? Being very overweight. Having family members with sleep apnea. Having a tongue or tonsils that are larger than normal. Having a small airway or jaw problems. Being older. What are the signs or symptoms? Loud snoring. Restless sleep. Trouble staying asleep. Being sleepy or tired during the day. Waking up gasping or choking. Having a headache in the morning. Mood swings. Having a hard time remembering things and concentrating. How is this diagnosed? A medical history. A physical exam. A sleep study. This is also called a polysomnography test. This test is done at a sleep lab or in your home while you are sleeping. How is this treated? Treatment may include: Sleeping on your side. Losing weight if you're overweight. Wearing an oral appliance. This is a mouthpiece that moves your lower jaw forward. Using a positive airway pressure (PAP) device to keep your airways open while you sleep, such as: A continuous positive airway pressure (CPAP) device. This device gives forced air through a mask when you  breathe out. This keeps your airways open. A bilevel positive airway pressure (BIPAP) device. This device gives forced air through a mask when you breathe in and when you breathe out to keep your airways open. Having surgery if other treatments do not work. If your sleep apnea is not treated, you may be at risk for: Heart failure. Heart attack. Stroke. Type 2 diabetes or a problem with your blood sugar called insulin resistance. Follow these instructions at home: Medicines Take your medicines only as told by your health care provider. Avoid alcohol, medicines to help you relax, and certain pain medicines. These may make sleep apnea worse. General instructions Do not smoke, vape, or use products with nicotine or tobacco in them. If you need help quitting, talk with your provider. If you were given a PAP device to open your airway while you sleep, use it as told by your provider. If you're having surgery, make sure to tell your provider you have sleep apnea. You may need to bring your PAP device with you. Contact a health care provider if: The PAP device that you were given to use during sleep bothers you or does not seem to be working. You do not feel better or you feel worse. Get help right away if: You have trouble breathing. You have chest pain. You have trouble talking. One side of your body feels weak. A part of your face is hanging down. These symptoms may be an emergency. Call 911 right away. Do not wait to see if the symptoms will go away. Do not drive yourself to  the hospital. This information is not intended to replace advice given to you by your health care provider. Make sure you discuss any questions you have with your health care provider. Document Revised: 11/23/2022 Document Reviewed: 04/27/2022 Elsevier Patient Education  2024 [employer].

## 2023-08-14 ENCOUNTER — Ambulatory Visit: Payer: Self-pay | Admitting: Family Medicine

## 2023-08-14 DIAGNOSIS — E039 Hypothyroidism, unspecified: Secondary | ICD-10-CM

## 2023-08-14 LAB — TSH+FREE T4
Free T4: 1.42 ng/dL (ref 0.82–1.77)
TSH: 6.11 u[IU]/mL — ABNORMAL HIGH (ref 0.450–4.500)

## 2023-08-14 LAB — CBC
Hematocrit: 47 % — ABNORMAL HIGH (ref 34.0–46.6)
Hemoglobin: 15.3 g/dL (ref 11.1–15.9)
MCH: 29.2 pg (ref 26.6–33.0)
MCHC: 32.6 g/dL (ref 31.5–35.7)
MCV: 90 fL (ref 79–97)
Platelets: 284 10*3/uL (ref 150–450)
RBC: 5.24 x10E6/uL (ref 3.77–5.28)
RDW: 13.1 % (ref 11.7–15.4)
WBC: 9.8 10*3/uL (ref 3.4–10.8)

## 2023-08-14 LAB — VITAMIN D 25 HYDROXY (VIT D DEFICIENCY, FRACTURES): Vit D, 25-Hydroxy: 29.9 ng/mL — ABNORMAL LOW (ref 30.0–100.0)

## 2023-08-14 LAB — VITAMIN B12: Vitamin B-12: 283 pg/mL (ref 232–1245)

## 2023-08-14 MED ORDER — LEVOTHYROXINE SODIUM 50 MCG PO TABS: ORAL_TABLET | ORAL | 3 refills | Status: AC

## 2023-08-30 ENCOUNTER — Other Ambulatory Visit: Payer: Self-pay | Admitting: Family Medicine

## 2023-08-30 DIAGNOSIS — F3341 Major depressive disorder, recurrent, in partial remission: Secondary | ICD-10-CM

## 2023-08-30 DIAGNOSIS — F410 Panic disorder [episodic paroxysmal anxiety] without agoraphobia: Secondary | ICD-10-CM

## 2023-10-03 ENCOUNTER — Encounter

## 2023-10-16 ENCOUNTER — Other Ambulatory Visit: Payer: Self-pay | Admitting: Family Medicine

## 2023-10-16 DIAGNOSIS — F3341 Major depressive disorder, recurrent, in partial remission: Secondary | ICD-10-CM

## 2023-11-12 ENCOUNTER — Other Ambulatory Visit: Payer: Self-pay | Admitting: Nurse Practitioner

## 2023-11-12 ENCOUNTER — Inpatient Hospital Stay: Admission: RE | Admit: 2023-11-12 | Source: Ambulatory Visit

## 2023-11-12 DIAGNOSIS — Z1231 Encounter for screening mammogram for malignant neoplasm of breast: Secondary | ICD-10-CM

## 2023-12-17 DIAGNOSIS — S199XXA Unspecified injury of neck, initial encounter: Secondary | ICD-10-CM | POA: Diagnosis not present

## 2023-12-17 DIAGNOSIS — R918 Other nonspecific abnormal finding of lung field: Secondary | ICD-10-CM | POA: Diagnosis not present

## 2023-12-17 DIAGNOSIS — N390 Urinary tract infection, site not specified: Secondary | ICD-10-CM | POA: Diagnosis not present

## 2023-12-17 DIAGNOSIS — Z96611 Presence of right artificial shoulder joint: Secondary | ICD-10-CM | POA: Diagnosis not present

## 2023-12-17 DIAGNOSIS — M4312 Spondylolisthesis, cervical region: Secondary | ICD-10-CM | POA: Diagnosis not present

## 2023-12-17 DIAGNOSIS — S79812A Other specified injuries of left hip, initial encounter: Secondary | ICD-10-CM | POA: Diagnosis not present

## 2023-12-17 DIAGNOSIS — Z743 Need for continuous supervision: Secondary | ICD-10-CM | POA: Diagnosis not present

## 2023-12-17 DIAGNOSIS — A419 Sepsis, unspecified organism: Secondary | ICD-10-CM | POA: Diagnosis not present

## 2023-12-17 DIAGNOSIS — R4182 Altered mental status, unspecified: Secondary | ICD-10-CM | POA: Diagnosis not present

## 2023-12-17 DIAGNOSIS — M47812 Spondylosis without myelopathy or radiculopathy, cervical region: Secondary | ICD-10-CM | POA: Diagnosis not present

## 2023-12-18 ENCOUNTER — Telehealth: Payer: Self-pay | Admitting: Family Medicine

## 2023-12-18 ENCOUNTER — Telehealth: Payer: Self-pay

## 2023-12-18 DIAGNOSIS — I351 Nonrheumatic aortic (valve) insufficiency: Secondary | ICD-10-CM | POA: Diagnosis not present

## 2023-12-18 DIAGNOSIS — Z96611 Presence of right artificial shoulder joint: Secondary | ICD-10-CM | POA: Diagnosis not present

## 2023-12-18 DIAGNOSIS — M19011 Primary osteoarthritis, right shoulder: Secondary | ICD-10-CM | POA: Diagnosis not present

## 2023-12-18 DIAGNOSIS — J9811 Atelectasis: Secondary | ICD-10-CM | POA: Diagnosis not present

## 2023-12-18 DIAGNOSIS — A419 Sepsis, unspecified organism: Secondary | ICD-10-CM | POA: Diagnosis not present

## 2023-12-18 NOTE — Telephone Encounter (Signed)
 Copied from CRM [id number]. Topic: General - Other >> Dec 18, 2023  4:35 PM Harlene ORN wrote: Reason for CRM: Arland [facility] [place] [employer]  She spoke with someone about getting her Care Everywhere ID number. Transferred to the office.  Phone: [phone] ext. [phone]

## 2023-12-18 NOTE — Telephone Encounter (Signed)
 Copied from CRM #[id number]. Topic: General - Other >> Dec 18, 2023  4:14 PM Joesph NOVAK wrote: Reason for CRM:  Received call from [facility] per Arland Bleak ph: [phone] ext. [phone], fax: [phone]. Arland is wanting a copy of her last AVS. She needs patients care everywhere ID#.

## 2023-12-19 DIAGNOSIS — R749 Abnormal serum enzyme level, unspecified: Secondary | ICD-10-CM | POA: Diagnosis not present

## 2023-12-19 DIAGNOSIS — R4181 Age-related cognitive decline: Secondary | ICD-10-CM | POA: Diagnosis not present

## 2023-12-19 NOTE — Telephone Encounter (Signed)
 Faxed last two OV Notes Spoke with someone from [facility] is aware we do not have a care everywhere id #

## 2023-12-25 ENCOUNTER — Encounter: Payer: Self-pay | Admitting: Family Medicine

## 2023-12-25 ENCOUNTER — Ambulatory Visit: Admitting: Family Medicine

## 2023-12-25 VITALS — BP 114/76 | HR 98 | Temp 97.5°F | Ht 64.0 in | Wt 264.5 lb

## 2023-12-25 DIAGNOSIS — F5104 Psychophysiologic insomnia: Secondary | ICD-10-CM

## 2023-12-25 DIAGNOSIS — R7309 Other abnormal glucose: Secondary | ICD-10-CM | POA: Diagnosis not present

## 2023-12-25 DIAGNOSIS — Z79899 Other long term (current) drug therapy: Secondary | ICD-10-CM

## 2023-12-25 DIAGNOSIS — J410 Simple chronic bronchitis: Secondary | ICD-10-CM

## 2023-12-25 DIAGNOSIS — F3341 Major depressive disorder, recurrent, in partial remission: Secondary | ICD-10-CM

## 2023-12-25 DIAGNOSIS — E039 Hypothyroidism, unspecified: Secondary | ICD-10-CM

## 2023-12-25 DIAGNOSIS — Z0001 Encounter for general adult medical examination with abnormal findings: Secondary | ICD-10-CM

## 2023-12-25 DIAGNOSIS — F411 Generalized anxiety disorder: Secondary | ICD-10-CM

## 2023-12-25 DIAGNOSIS — F410 Panic disorder [episodic paroxysmal anxiety] without agoraphobia: Secondary | ICD-10-CM | POA: Diagnosis not present

## 2023-12-25 DIAGNOSIS — Z0000 Encounter for general adult medical examination without abnormal findings: Secondary | ICD-10-CM

## 2023-12-25 LAB — BAYER DCA HB A1C WAIVED: HB A1C (BAYER DCA - WAIVED): 5 % (ref 4.8–5.6)

## 2023-12-25 MED ORDER — CLONAZEPAM 0.5 MG PO TABS: ORAL_TABLET | ORAL | 5 refills | Status: AC

## 2023-12-25 MED ORDER — BREZTRI AEROSPHERE 160-9-4.8 MCG/ACT IN AERO: 2.0000 | INHALATION_SPRAY | Freq: Two times a day (BID) | RESPIRATORY_TRACT | 11 refills | Status: AC

## 2023-12-25 MED ORDER — ALBUTEROL SULFATE HFA 108 (90 BASE) MCG/ACT IN AERS: INHALATION_SPRAY | RESPIRATORY_TRACT | 0 refills | Status: DC

## 2023-12-25 NOTE — Patient Instructions (Signed)
Silver Sneakers

## 2023-12-25 NOTE — Progress Notes (Signed)
 Erin Hale is a 68 y.o. female presents to office today for annual physical exam examination.    Chronic bronchitis She reports chronic productive cough.  No hemoptysis or brown sputum. Uses her albuterol  inhaler 2-3x per week q6h. she is a former smoker that quit over 30 years ago but was a heavy smoker 2 packs/day.  Interested in maybe having something a little bit stronger for breathing.  She has been a COPD suspect but no formal diagnosis in the past.  Reports mechanical fall where she missed a step coming from her hair appointment to this office today.  She reports a scrape on her right knee but no other concerning symptoms or signs.  Occupation: Retired, Marital status: Single, Substance use: Former smoker Health Maintenance Due  Topic Date Due   Medicare Annual Wellness (AWV)  Never done   Mammogram  Never done    Immunization History  Administered Date(s) Administered   Tdap 01/05/2023   Past Medical History:  Diagnosis Date   Bronchial asthma    Depression    GERD (gastroesophageal reflux disease)    Hypothyroidism    Panic disorder    Thyroid  disease    hypothyroidism   Social History   Socioeconomic History   Marital status: Divorced    Spouse name: Not on file   Number of children: Not on file   Years of education: Not on file   Highest education level: Not on file  Occupational History   Not on file  Tobacco Use   Smoking status: Former    Current packs/day: 0.00    Types: Cigarettes    Quit date: 03/15/1994    Years since quitting: 29.8   Smokeless tobacco: Never  Vaping Use   Vaping status: Never Used  Substance and Sexual Activity   Alcohol use: No   Drug use: No   Sexual activity: Not on file  Other Topics Concern   Not on file  Social History Narrative   Not on file   Social Drivers of Health   Financial Resource Strain: Patient Unable To Answer (12/18/2023)   Received from [facility]   Overall Financial Resource Strain  (CARDIA)    How hard is it for you to pay for the very basics like food, housing, medical care, and heating?: Patient unable to answer  Food Insecurity: Patient Unable To Answer (12/18/2023)   Received from [facility]   Hunger Vital Sign    Within the past 12 months, you worried that your food would run out before you got the money to buy more.: Patient unable to answer    Within the past 12 months, the food you bought just didn't last and you didn't have money to get more.: Patient unable to answer  Transportation Needs: Patient Unable To Answer (12/18/2023)   Received from [facility] - [facility]    Lack of Transportation (Medical): Patient unable to answer    Lack of Transportation (Non-Medical): Patient unable to answer  Physical Activity: Patient Unable To Answer (12/18/2023)   Received from [facility]   Exercise Vital Sign    On average, how many days per week do you engage in moderate to strenuous exercise (like a brisk walk)?: Patient unable to answer    On average, how many minutes do you engage in exercise at this level?: Patient unable to answer  Stress: Patient Unable To Answer (12/18/2023)   Received from [facility]  [employer] [facility] Occupational [facility] - Occupational Stress Questionnaire    Do you feel stress - tense, restless, nervous, or anxious, or unable to sleep at night because your mind is troubled all the time - these days?: Patient unable to answer  Social Connections: Patient Unable To Answer (12/18/2023)   Received from [facility]   Social Connection and Isolation Panel    In a typical week, how many times do you talk on the phone with family, friends, or neighbors?: Patient unable to answer    How often do you get together with friends or relatives?: Patient unable to answer    How often do you attend church or religious services?: Patient unable to answer    Do you belong to any clubs or organizations such as church  groups, unions, fraternal or athletic groups, or school groups?: Patient unable to answer    How often do you attend meetings of the clubs or organizations you belong to?: Patient unable to answer    Are you married, widowed, divorced, separated, never married, or living with a partner?: Patient unable to answer  Intimate Partner Violence: Patient Unable To Answer (12/18/2023)   Received from [facility]   Humiliation, Afraid, Rape, and Kick questionnaire    Within the last year, have you been afraid of your partner or ex-partner?: Patient unable to answer    Within the last year, have you been humiliated or emotionally abused in other ways by your partner or ex-partner?: Patient unable to answer    Within the last year, have you been kicked, hit, slapped, or otherwise physically hurt by your partner or ex-partner?: Patient unable to answer    Within the last year, have you been raped or forced to have any kind of sexual activity by your partner or ex-partner?: Patient unable to answer   Past Surgical History:  Procedure Laterality Date   ABDOMINAL HYSTERECTOMY     BIOPSY  01/22/2018   Procedure: BIOPSY;  Surgeon: Harvey Margo CROME, MD;  [facility];  Service: Endoscopy;;  (colon)   COLONOSCOPY WITH PROPOFOL  N/A 01/22/2018   Procedure: COLONOSCOPY WITH PROPOFOL ;  Surgeon: Harvey Margo CROME, MD;  [facility]: [facility];  Service: Endoscopy;  Laterality: N/A;  1:30pm   ESOPHAGOGASTRODUODENOSCOPY (EGD) WITH PROPOFOL  N/A 01/22/2018   Procedure: ESOPHAGOGASTRODUODENOSCOPY (EGD) WITH PROPOFOL ;  Surgeon: Harvey Margo CROME, MD;  [facility]: [facility];  Service: Endoscopy;  Laterality: N/A;   POLYPECTOMY  01/22/2018   Procedure: POLYPECTOMY;  Surgeon: Harvey Margo CROME, MD;  [facility];  Service: Endoscopy;;  (colon)   SAVORY DILATION N/A 01/22/2018   Procedure: SAVORY DILATION;  Surgeon: Harvey Margo CROME, MD;  [facility]: [facility];  Service: Endoscopy;  Laterality: N/A;    Family History  Problem Relation Age of Onset   Stroke Mother    Congestive Heart Failure Mother    Diabetes Father    Cancer Father        brain   Colon cancer Neg Hx    Colon polyps Neg Hx     Current Outpatient Medications:    acetaminophen  (TYLENOL ) 650 MG CR tablet, Take 650 mg by mouth every 8 (eight) hours as needed for pain. , Disp: , Rfl:    albuterol  (VENTOLIN  HFA) 108 (90 Base) MCG/ACT inhaler, INHALE 2 PUFFS INTO THE LUNGS EVERY 6 HOURS AS NEEDED FOR WHEEZE OR SHORTNESS OF BREATH, Disp: 18 each, Rfl: 0   Ascorbic Acid (VITAMIN C) 100  MG tablet, Take 100 mg by mouth daily., Disp: , Rfl:    azelastine  (ASTELIN ) 0.1 % nasal spray, Place 1 spray into both nostrils 2 (two) times daily., Disp: 30 mL, Rfl: 5   clonazePAM  (KLONOPIN ) 0.5 MG tablet, TAKE 1 TABLET BY MOUTH TWICE DAILY AS NEEDED FOR ANXIETY (PANIC), Disp: 60 tablet, Rfl: 5   desloratadine  (CLARINEX ) 5 MG tablet, Take 1 tablet (5 mg total) by mouth daily. For allergies/ drainage, Disp: 90 tablet, Rfl: 3   diphenhydrAMINE (BENADRYL) 25 mg capsule, Take 25 mg by mouth every 6 (six) hours as needed., Disp: , Rfl:    escitalopram  (LEXAPRO ) 20 MG tablet, TAKE 1 TABLET BY MOUTH EVERY DAY, Disp: 90 tablet, Rfl: 1   levothyroxine  (SYNTHROID ) 50 MCG tablet, Take 1.5 tablets every other day (alternating with 1 tablet)., Disp: 135 tablet, Rfl: 3   Multiple Vitamins-Minerals (VITAMIN D3 COMPLETE PO), Take by mouth., Disp: , Rfl:    simethicone  (MYLICON) 125 MG chewable tablet, Chew 250 mg by mouth as needed for flatulence., Disp: , Rfl:    fenoprofen (NALFON) 600 MG TABS tablet, Take 600 mg by mouth 2 (two) times daily. (Patient not taking: Reported on 12/25/2023), Disp: , Rfl:    pantoprazole  (PROTONIX ) 40 MG tablet, Take 1 tablet (40 mg total) by mouth daily. (Patient not taking: Reported on 12/25/2023), Disp: 90 tablet, Rfl: 3   traZODone (DESYREL) 50 MG tablet, Take 0.5-1 tablets (25-50 mg total) by mouth at bedtime as needed for  sleep. (Patient not taking: Reported on 12/25/2023), Disp: 90 tablet, Rfl: 3  No Known Allergies   ROS: Review of Systems Pertinent items noted in HPI and remainder of comprehensive ROS otherwise negative.    Physical exam BP 114/76   Pulse 98   Temp (!) 97.5 F (36.4 C)   Ht 5' 4 (1.626 m)   Wt 264 lb 8 oz (120 kg)   LMP  (LMP Unknown)   SpO2 93%   BMI 45.40 kg/m  General appearance: alert, cooperative, appears stated age, no distress, and morbidly obese Head: Normocephalic, without obvious abnormality, atraumatic Eyes: negative findings: lids and lashes normal, conjunctivae and sclerae normal, corneas clear, and pupils equal, round, reactive to light and accomodation Ears: normal TM's and external ear canals both ears Nose: Nares normal. Septum midline. Mucosa normal. No drainage or sinus tenderness. Throat: lips, mucosa, and tongue normal; teeth and gums normal Neck: no adenopathy, no carotid bruit, supple, symmetrical, trachea midline, and thyroid  not enlarged, symmetric, no tenderness/mass/nodules Back: symmetric, no curvature. ROM normal. No CVA tenderness. Lungs: clear to auscultation bilaterally Heart: regular rate and rhythm, S1, S2 normal, no murmur, click, rub or gallop Abdomen: Obese, soft, nontender. Extremities: She has an abrasion along the right anterior knee.  No gross joint deformity, soft tissue swelling or ecchymosis appreciated.  She is ambulating independently with minimally antalgic gait Pulses: 2+ and symmetric Skin: Skin as above Lymph nodes: Supraclavicular and cervical lymph nodes within normal range Neurologic: Grossly normal      12/25/2023    1:36 PM 08/13/2023    8:33 AM 01/05/2023    2:06 PM  Depression screen PHQ 2/9  Decreased Interest 1 2 1   Down, Depressed, Hopeless 1 1 1   PHQ - 2 Score 2 3 2   Altered sleeping 2 2 2   Tired, decreased energy 2 3 2   Change in appetite 0 1 2  Feeling bad or failure about yourself  1 0 1  Trouble  concentrating 0 1 0  Moving slowly or fidgety/restless 0 1 0  Suicidal thoughts 0 0 0  PHQ-9 Score 7 11 9   Difficult doing work/chores Somewhat difficult Somewhat difficult Somewhat difficult      12/25/2023    1:36 PM 08/13/2023    8:33 AM 01/05/2023    2:06 PM 01/18/2022    3:45 PM  GAD 7 : Generalized Anxiety Score  Nervous, Anxious, on Edge 1 1 1 1   Control/stop worrying 1 2 1 2   Worry too much - different things 1 2 1 2   Trouble relaxing 1 1 1 1   Restless 0 0 0 0  Easily annoyed or irritable 1 1 1 1   Afraid - awful might happen 2 2 2 2   Total GAD 7 Score 7 9 7 9   Anxiety Difficulty Somewhat difficult Somewhat difficult  Somewhat difficult       12/25/2023    1:54 PM 01/05/2023    2:10 PM 07/19/2022    3:30 PM  MMSE - Mini Mental State Exam  Orientation to time 5 4 4   Orientation to Place 5 4 5   Registration 3 3 2   Attention/ Calculation 5 5 5   Recall 2 3 3   Language- name 2 objects 2 2 2   Language- repeat 1 1 1   Language- follow 3 step command 3 3 3   Language- read & follow direction 1 1 1   Write a sentence 1 1 1   Copy design 1 1 1   Total score 29 28 28     Assessment/ Plan: Rock FORBES Angle here for annual physical exam.   Annual physical exam  Generalized anxiety disorder with panic attacks - Plan: CMP14+EGFR, ToxASSURE Select 13 (MW), Urine, clonazePAM  (KLONOPIN ) 0.5 MG tablet  Recurrent major depressive disorder, in partial remission - Plan: clonazePAM  (KLONOPIN ) 0.5 MG tablet  Acquired hypothyroidism - Plan: TSH + free T4, CMP14+EGFR  Severe obesity (BMI >= 40) (HCC) - Plan: CMP14+EGFR, Lipid Panel, Bayer DCA Hb A1c Waived  Psychophysiological insomnia - Plan: CMP14+EGFR, ToxASSURE Select 13 (MW), Urine  Controlled substance agreement signed - Plan: CMP14+EGFR, ToxASSURE Select 13 (MW), Urine  Simple chronic bronchitis (HCC) - Plan: budesonide -glycopyrrolate-formoterol  (BREZTRI AEROSPHERE) 160-9-4.8 MCG/ACT AERO inhaler, albuterol  (VENTOLIN  HFA) 108 (90  Base) MCG/ACT inhaler   UDS and CSA were updated as per office policy and the national narcotic database was reviewed no red flags.  Klonopin  renewed.  MMSE score demonstrated no abnormalities.  We again reiterated that benzodiazepines have a risk of development of dementia, falls, fracture and even death.  Given agoraphobic type behaviors I think that the medication is still medically necessary.  She will continue her SSRI as prescribed as well.  I offered her referral for counseling services today but she declined  Check thyroid  levels, nonfasting labs.  Reinforced need for increased physical activity and I suggested Silver sneakers today.  I think this would also help her from a mental health standpoint.  She is going to try low-dose trazodone for sleep.  Again reinforced need to avoid Benadryl use when not appropriate  She has been a COPD suspect but never formally diagnosed with COPD.  I am going to trial her on sample of Breztri.  First inhalations were done here in office.  We discussed rinsing mouth after each use.  She will contact me if she needs this sent the patient assistance program  Counseled on healthy lifestyle choices, including diet (rich in fruits, vegetables and lean meats and low in salt and simple carbohydrates) and exercise (at least 30  minutes of moderate physical activity daily).  Patient to follow up 5-66m  Lorenia Hoston M. Jolinda, DO

## 2023-12-26 ENCOUNTER — Ambulatory Visit: Payer: Self-pay | Admitting: Family Medicine

## 2023-12-26 LAB — CMP14+EGFR
ALT: 17 IU/L (ref 0–32)
AST: 22 IU/L (ref 0–40)
Albumin: 4 g/dL (ref 3.9–4.9)
Alkaline Phosphatase: 117 IU/L (ref 49–135)
BUN/Creatinine Ratio: 12 (ref 12–28)
BUN: 13 mg/dL (ref 8–27)
Bilirubin Total: 0.6 mg/dL (ref 0.0–1.2)
CO2: 22 mmol/L (ref 20–29)
Calcium: 9 mg/dL (ref 8.7–10.3)
Chloride: 101 mmol/L (ref 96–106)
Creatinine, Ser: 1.09 mg/dL — ABNORMAL HIGH (ref 0.57–1.00)
Globulin, Total: 2.3 g/dL (ref 1.5–4.5)
Glucose: 101 mg/dL — ABNORMAL HIGH (ref 70–99)
Potassium: 4 mmol/L (ref 3.5–5.2)
Sodium: 140 mmol/L (ref 134–144)
Total Protein: 6.3 g/dL (ref 6.0–8.5)
eGFR: 56 mL/min/1.73 — ABNORMAL LOW (ref >59)

## 2023-12-26 LAB — LIPID PANEL
Chol/HDL Ratio: 3.6 ratio (ref 0.0–4.4)
Cholesterol, Total: 225 mg/dL — ABNORMAL HIGH (ref 100–199)
HDL: 62 mg/dL (ref >39)
LDL Chol Calc (NIH): 141 mg/dL — ABNORMAL HIGH (ref 0–99)
Triglycerides: 127 mg/dL (ref 0–149)
VLDL Cholesterol Cal: 22 mg/dL (ref 5–40)

## 2023-12-26 LAB — TSH+FREE T4
Free T4: 1.62 ng/dL (ref 0.82–1.77)
TSH: 2.8 u[IU]/mL (ref 0.450–4.500)

## 2023-12-27 ENCOUNTER — Ambulatory Visit: Payer: Self-pay

## 2023-12-27 NOTE — Telephone Encounter (Signed)
 Pt requests phone call (not MyChart message) to advise if any changes are needed on levothyroxine .   FYI Only or Action Required?: Action required by provider: clinical question for provider.  Patient was last seen in primary care on 12/25/2023 by Jolinda Norene HERO, DO.  Patient/caregiver understands and will follow disposition?:  Reason for Disposition  [1] Caller requesting NON-URGENT health information AND [2] PCP's office is the best resource  Answer Assessment - Initial Assessment Questions Pt is asking if she needs to stay on or adjust levothyroxine .   1. REASON FOR CALL: What is the main reason for your call? or How can I best help you?     Requesting lab results  Per provider:  Ongoing kidney impairment consistent with CKD 3A.  Please make sure she avoids NSAIDs, drinks lots of water   Liver enzymes and electrolytes are normal  Cholesterol remains elevated and has risen since check 1 year ago.  Lifestyle modification necessary to reduce cardiovascular risk  Thyroid  levels are now normal  A1c demonstrates no evidence of diabetes or prediabetes  Protocols used: Information Only Call - No Triage-A-AH Copied from CRM [id number]. Topic: Clinical - Lab/Test Results >> Dec 27, 2023  9:08 AM Tobias CROME wrote: Reason for CRM: Relayed results to patient. Has additional questions.

## 2023-12-27 NOTE — Telephone Encounter (Signed)
 Pt notified to continue current regimen. LS

## 2023-12-28 LAB — TOXASSURE SELECT 13 (MW), URINE

## 2024-01-15 ENCOUNTER — Telehealth: Payer: Self-pay

## 2024-01-15 NOTE — Telephone Encounter (Signed)
Noted  -LS

## 2024-01-15 NOTE — Telephone Encounter (Signed)
 Copied from CRM [id number]. Topic: Clinical - Lab/Test Results >> Jan 15, 2024  3:04 PM Larissa RAMAN wrote: Reason for CRM: Patient returned call. Relayed lab results, per provider note. Patient verbalized understanding and no additional questions at this time.

## 2024-01-30 ENCOUNTER — Encounter: Admitting: Family Medicine

## 2024-02-20 ENCOUNTER — Encounter: Admitting: Family Medicine

## 2024-02-25 ENCOUNTER — Other Ambulatory Visit: Payer: Self-pay | Admitting: Family Medicine

## 2024-02-25 DIAGNOSIS — J410 Simple chronic bronchitis: Secondary | ICD-10-CM

## 2024-03-10 ENCOUNTER — Inpatient Hospital Stay: Admission: RE | Admit: 2024-03-10 | Source: Ambulatory Visit

## 2024-04-11 ENCOUNTER — Other Ambulatory Visit: Payer: Self-pay | Admitting: Family Medicine

## 2024-04-11 DIAGNOSIS — F3341 Major depressive disorder, recurrent, in partial remission: Secondary | ICD-10-CM

## 2024-05-23 ENCOUNTER — Ambulatory Visit: Payer: Self-pay | Admitting: Family Medicine

## 2024-12-29 ENCOUNTER — Encounter: Payer: Self-pay | Admitting: Family Medicine
